# Patient Record
Sex: Female | Born: 1985 | Race: Black or African American | Hispanic: No | Marital: Single | Smoking: Former smoker
Health system: Southern US, Community
[De-identification: ages and names within clinical notes are randomized; demographics above are authoritative.]

## PROBLEM LIST (undated history)

## (undated) DIAGNOSIS — O210 Mild hyperemesis gravidarum: Secondary | ICD-10-CM

## (undated) DIAGNOSIS — R519 Headache, unspecified: Secondary | ICD-10-CM

## (undated) DIAGNOSIS — R109 Unspecified abdominal pain: Secondary | ICD-10-CM

## (undated) DIAGNOSIS — F329 Major depressive disorder, single episode, unspecified: Secondary | ICD-10-CM

## (undated) DIAGNOSIS — K219 Gastro-esophageal reflux disease without esophagitis: Secondary | ICD-10-CM

## (undated) DIAGNOSIS — F4310 Post-traumatic stress disorder, unspecified: Secondary | ICD-10-CM

## (undated) DIAGNOSIS — R51 Headache: Secondary | ICD-10-CM

## (undated) DIAGNOSIS — F419 Anxiety disorder, unspecified: Secondary | ICD-10-CM

## (undated) DIAGNOSIS — B009 Herpesviral infection, unspecified: Secondary | ICD-10-CM

## (undated) DIAGNOSIS — F32A Depression, unspecified: Secondary | ICD-10-CM

## (undated) DIAGNOSIS — B958 Unspecified staphylococcus as the cause of diseases classified elsewhere: Secondary | ICD-10-CM

## (undated) HISTORY — DX: Headache: R51

## (undated) HISTORY — DX: Unspecified staphylococcus as the cause of diseases classified elsewhere: B95.8

## (undated) HISTORY — DX: Unspecified abdominal pain: R10.9

## (undated) HISTORY — DX: Headache, unspecified: R51.9

## (undated) HISTORY — DX: Herpesviral infection, unspecified: B00.9

---
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
## 2010-07-27 HISTORY — PX: TUBAL LIGATION: SHX77

## 2011-06-02 ENCOUNTER — Encounter (HOSPITAL_COMMUNITY): Payer: Self-pay

## 2011-06-02 ENCOUNTER — Inpatient Hospital Stay (HOSPITAL_COMMUNITY): Payer: BC Managed Care – PPO

## 2011-06-02 ENCOUNTER — Inpatient Hospital Stay (HOSPITAL_COMMUNITY)
Admission: AD | Admit: 2011-06-02 | Discharge: 2011-06-02 | Disposition: A | Discharge status: Home / Self Care | Payer: BC Managed Care – PPO | Source: Ambulatory Visit | Attending: Obstetrics & Gynecology | Admitting: Obstetrics & Gynecology

## 2011-06-02 DIAGNOSIS — O2390 Unspecified genitourinary tract infection in pregnancy, unspecified trimester: Secondary | ICD-10-CM | POA: Insufficient documentation

## 2011-06-02 DIAGNOSIS — B373 Candidiasis of vulva and vagina: Secondary | ICD-10-CM

## 2011-06-02 DIAGNOSIS — O98819 Other maternal infectious and parasitic diseases complicating pregnancy, unspecified trimester: Secondary | ICD-10-CM | POA: Insufficient documentation

## 2011-06-02 DIAGNOSIS — A5901 Trichomonal vulvovaginitis: Secondary | ICD-10-CM | POA: Insufficient documentation

## 2011-06-02 DIAGNOSIS — B3731 Acute candidiasis of vulva and vagina: Secondary | ICD-10-CM | POA: Insufficient documentation

## 2011-06-02 DIAGNOSIS — O4690 Antepartum hemorrhage, unspecified, unspecified trimester: Secondary | ICD-10-CM

## 2011-06-02 DIAGNOSIS — O4700 False labor before 37 completed weeks of gestation, unspecified trimester: Secondary | ICD-10-CM | POA: Insufficient documentation

## 2011-06-02 HISTORY — DX: Mild hyperemesis gravidarum: O21.0

## 2011-06-02 LAB — URINALYSIS, ROUTINE W REFLEX MICROSCOPIC
Bilirubin Urine: NEGATIVE
Ketones, ur: NEGATIVE mg/dL
Nitrite: NEGATIVE
pH: 6 (ref 5.0–8.0)

## 2011-06-02 LAB — URINE MICROSCOPIC-ADD ON

## 2011-06-02 LAB — WET PREP, GENITAL: Clue Cells Wet Prep HPF POC: NONE SEEN

## 2011-06-02 MED ORDER — METRONIDAZOLE 500 MG PO TABS: 500.0000 mg | ORAL_TABLET | Freq: Two times a day (BID) | ORAL | Status: DC

## 2011-06-02 MED ORDER — FLUCONAZOLE 150 MG PO TABS: 150.0000 mg | ORAL_TABLET | Freq: Once | ORAL | Status: AC

## 2011-06-02 MED ORDER — METRONIDAZOLE 500 MG PO TABS: 500.0000 mg | ORAL_TABLET | Freq: Three times a day (TID) | ORAL | Status: AC

## 2011-06-02 NOTE — ED Provider Notes (Signed)
History    25 y/o G5P4004 comes in at [redacted]w[redacted]d with c/o contractions, a single episode of bleeding PV and yellow discharge PV  Chief Complaint  Patient presents with  . Contractions   HPI: Patient presents with c/o an episode of bleeding PV at about 9:30am this morning. She wiped it off with a couple of tissues and has not had any bleeding since then. She did not see any clots during this episode.She also c/o contractions since 11am this morning which are irregular, of moderate intensity and about 10/hour.  No h/o of trauma, intercourse in the past week or STIs during this pregnancies  Patient also c/o a yellowish, creamy, non-odorous dischrge PV since this morning. No associated itching or irritation.  No h/o leaking PV or ROM, baby is moving well. She has a h/o hyperemesis for which she is receiving Zofran and Protonix during this pregnancy.   Patient is receiving PNC in [place] and is here visiting. She was last seen on 05/22/11 and did not have any complications during this pregnancy apart from Hyperemesis.   No h/o fever, headaches, visual disturbances, RUQ pain, burning during micturition.  OB History    Grav Para Term Preterm Abortions TAB SAB Ect Mult Living   5 4 4  0 0 0 0 0 0 4    All previous pregnancies were term, NVD and without any complications. Last pregnancy was in 2011  Past Medical History  Diagnosis Date  . Hyperemesis gravidarum     Past Surgical History  Procedure Date  . Mouth surgery 2005    No family history on file.  History  Substance Use Topics  . Smoking status: Never Smoker   . Smokeless tobacco: Not on file  . Alcohol Use: No    Allergies:  Allergies  Allergen Reactions  . Hydrocodone Hives and Itching  . Latex Hives  . Tape Hives    Prescriptions prior to admission  Medication Sig Dispense Refill  . acetaminophen (TYLENOL) 500 MG tablet Take 500 mg by mouth daily as needed. pain       . ondansetron (ZOFRAN) 8 MG tablet Take 1 mg by mouth  daily as needed. nausea       . pantoprazole (PROTONIX) 40 MG tablet Take 40 mg by mouth daily.          Review of Systems  Constitutional: Negative for fever and chills.  HENT: Negative for ear pain, sore throat and ear discharge.   Eyes: Negative for blurred vision and double vision.  Respiratory: Negative for cough, shortness of breath and wheezing.   Cardiovascular: Negative for chest pain and palpitations.  Gastrointestinal: Positive for heartburn and nausea.  Genitourinary: Negative for dysuria, urgency and frequency.  Musculoskeletal: Negative for myalgias and joint pain.  Skin: Negative for itching.  Neurological: Negative for dizziness, tingling, tremors and headaches.  Endo/Heme/Allergies: Does not bruise/bleed easily.   Physical Exam   Blood pressure 110/63, pulse 92, temperature 98.2 F (36.8 C), temperature source Oral, resp. rate 20, height 5\' 4"  (1.626 m), weight 221 lb 12.8 oz (100.608 kg), last menstrual period 10/03/2010, SpO2 97.00%.  Physical Exam  Constitutional: She is oriented to person, place, and time. She appears well-developed and well-nourished. No distress.  HENT:  Head: Normocephalic and atraumatic.  Right Ear: External ear normal.  Left Ear: External ear normal.  Mouth/Throat: No oropharyngeal exudate.  Eyes: Conjunctivae and EOM are normal. Pupils are equal, round, and reactive to light. Right eye exhibits no discharge. Left eye  exhibits no discharge.  Neck: Normal range of motion. Neck supple.  Cardiovascular: Normal rate, regular rhythm, normal heart sounds and intact distal pulses.   No murmur heard. Respiratory: Effort normal and breath sounds normal. No respiratory distress. She has no wheezes.  GI: Soft. Bowel sounds are normal.  Genitourinary: There is no rash, tenderness, lesion or injury on the right labia. There is no rash, tenderness or injury on the left labia. No tenderness or bleeding around the vagina. No foreign body around the  vagina. No signs of injury around the vagina. Vaginal discharge found.       Yellowish white creamy discharge was seen in the vaginal walls on a speculum examination Cervix was closed on examination  Musculoskeletal: Normal range of motion.  Neurological: She is alert and oriented to person, place, and time. She has normal reflexes.  Skin: Skin is warm and dry.    MAU Course  Procedures  MDM FHR: 145 beats/ min, variability +, accelerations + UC: infrequent, irregular and more than 10 minutes apart on toco Pelvic examination: wet prep to confirm vaginal candidiasis US OB limited: r/o abruptio placenta   Assessment and Plan    Chetan Kapat 06/02/2011, 4:07 PM   Addendum: I reviewed history and examined patient with PAS. S: States her last [place] in [place] was all normal at 32 weeks. Denies antecedent intercourse or symptomatic vaginitis. Only prior bleeding episode was at 8 wks. No PN records available.  O: Abd: soft, NT, size c/w 34 weeks Spec: Vulva nl; Vagina with copious amt thick white adherent discharge; cx not friable and no evidence of cervicitis or lesions. No blood seen. VE: ext 1, int ft to closed/ long/ high  [place]: normal placenta ant, above cx, AFI 15 Results for orders placed during the hospital encounter of 06/02/11 (from the past 24 hour(s))  URINALYSIS, ROUTINE W REFLEX MICROSCOPIC     Status: Abnormal   Collection Time   06/02/11  3:50 PM      Component Value Range   Color, Urine YELLOW  YELLOW    Appearance CLEAR  CLEAR    Specific Gravity, Urine 1.020  1.005 - 1.030    pH 6.0  5.0 - 8.0    Glucose, UA NEGATIVE  NEGATIVE (mg/dL)   Hgb urine dipstick NEGATIVE  NEGATIVE    Bilirubin Urine NEGATIVE  NEGATIVE    Ketones, ur NEGATIVE  NEGATIVE (mg/dL)   Protein, ur NEGATIVE  NEGATIVE (mg/dL)   Urobilinogen, UA 0.2  0.0 - 1.0 (mg/dL)   Nitrite NEGATIVE  NEGATIVE    Leukocytes, UA LARGE (*) NEGATIVE   URINE MICROSCOPIC-ADD ON     Status: Abnormal   Collection Time    06/02/11  3:50 PM      Component Value Range   Squamous Epithelial / LPF FEW (*) RARE    WBC, UA 11-20  <3 (WBC/hpf)   Bacteria, UA RARE  RARE    Urine-Other MUCOUS PRESENT    WET PREP, GENITAL     Status: Abnormal   Collection Time   06/02/11  3:58 PM      Component Value Range   Yeast, Wet Prep MODERATE (*) NONE SEEN    Trich, Wet Prep FEW (*) NONE SEEN    Clue Cells, Wet Prep NONE SEEN  NONE SEEN    WBC, Wet Prep HPF POC MANY (*) NONE SEEN    ASSESSMENT: G5P4004 at 34.4 wks with reported light  red spotting p.v.; no evidence of bleeding found  Candidiasis and trich vaginiti  Cat 1 FHR tracing  PLAN: Home with bleeding precautions and OK to travel back to [place] in 2 days as planned if no further bleeding. Treat yeast and trich with Diflucan and Flagyl. F/U in [place] as scheduled

## 2011-06-02 NOTE — Progress Notes (Signed)
Pt states blood show this morning followed by contractions. States she has more than 10 contractions an hour. Denies leaking of fluid and has not had vaginal bleeding since this morning. Reports positive fetal movement. States she has been getting prenatal care in [place] but is currently visiting here.

## 2011-06-04 NOTE — ED Provider Notes (Signed)
Attestation of Attending Supervision of Advanced Practitioner: Evaluation and management procedures were performed by the PA/NP/CNM/OB Fellow under my supervision/collaboration. Chart reviewed, and agree with management and plan.  Jamahl Lemmons A M.D. 06/04/2011 10:29 AM   

## 2012-05-12 ENCOUNTER — Emergency Department (HOSPITAL_COMMUNITY)
Admission: EM | Admit: 2012-05-12 | Discharge: 2012-05-12 | Disposition: A | Discharge status: Home / Self Care | Payer: Medicaid Other | Attending: Emergency Medicine | Admitting: Emergency Medicine

## 2012-05-12 ENCOUNTER — Encounter (HOSPITAL_COMMUNITY): Payer: Self-pay

## 2012-05-12 DIAGNOSIS — R059 Cough, unspecified: Secondary | ICD-10-CM | POA: Insufficient documentation

## 2012-05-12 DIAGNOSIS — R590 Localized enlarged lymph nodes: Secondary | ICD-10-CM

## 2012-05-12 DIAGNOSIS — R070 Pain in throat: Secondary | ICD-10-CM | POA: Insufficient documentation

## 2012-05-12 DIAGNOSIS — J069 Acute upper respiratory infection, unspecified: Secondary | ICD-10-CM

## 2012-05-12 DIAGNOSIS — R05 Cough: Secondary | ICD-10-CM | POA: Insufficient documentation

## 2012-05-12 DIAGNOSIS — J029 Acute pharyngitis, unspecified: Secondary | ICD-10-CM

## 2012-05-12 NOTE — ED Notes (Signed)
Throat swabbed and sent to lab per protocol

## 2012-05-12 NOTE — ED Notes (Signed)
Pt has had sore throat and cough x 2 days

## 2012-05-13 LAB — STREP A DNA PROBE

## 2012-05-15 ENCOUNTER — Encounter (HOSPITAL_COMMUNITY): Payer: Self-pay | Admitting: Emergency Medicine

## 2012-05-15 ENCOUNTER — Emergency Department (HOSPITAL_COMMUNITY)
Admission: EM | Admit: 2012-05-15 | Discharge: 2012-05-15 | Disposition: A | Discharge status: Home / Self Care | Payer: Medicaid Other | Attending: Emergency Medicine | Admitting: Emergency Medicine

## 2012-05-15 DIAGNOSIS — R112 Nausea with vomiting, unspecified: Secondary | ICD-10-CM

## 2012-05-15 DIAGNOSIS — J029 Acute pharyngitis, unspecified: Secondary | ICD-10-CM | POA: Insufficient documentation

## 2012-05-15 LAB — CBC WITH DIFFERENTIAL/PLATELET
Eosinophils Relative: 0 % (ref 0–5)
HCT: 36.5 % (ref 36.0–46.0)
Hemoglobin: 12.1 g/dL (ref 12.0–15.0)
Lymphocytes Relative: 17 % (ref 12–46)
Lymphs Abs: 1.6 10*3/uL (ref 0.7–4.0)
MCV: 91.3 fL (ref 78.0–100.0)
Monocytes Absolute: 0.6 10*3/uL (ref 0.1–1.0)
Monocytes Relative: 6 % (ref 3–12)
RBC: 4 MIL/uL (ref 3.87–5.11)
RDW: 13.3 % (ref 11.5–15.5)
WBC: 9.4 10*3/uL (ref 4.0–10.5)

## 2012-05-15 LAB — COMPREHENSIVE METABOLIC PANEL
BUN: 6 mg/dL (ref 6–23)
CO2: 30 mEq/L (ref 19–32)
Calcium: 9.2 mg/dL (ref 8.4–10.5)
Creatinine, Ser: 0.62 mg/dL (ref 0.50–1.10)
GFR calc Af Amer: >90 mL/min (ref >90)
GFR calc non Af Amer: >90 mL/min (ref >90)
Glucose, Bld: 150 mg/dL — ABNORMAL HIGH (ref 70–99)
Total Bilirubin: 0.2 mg/dL — ABNORMAL LOW (ref 0.3–1.2)

## 2012-05-15 MED ORDER — DEXTROSE 5 % IV SOLN
1.0000 g | INTRAVENOUS | Status: DC
  Administered 2012-05-15: 1 g via INTRAVENOUS
  Filled 2012-05-15: qty 10

## 2012-05-15 MED ORDER — MAGIC MOUTHWASH: 10.0000 mL | ORAL | Status: DC | PRN

## 2012-05-15 MED ORDER — MORPHINE SULFATE 4 MG/ML IJ SOLN
4.0000 mg | Freq: Once | INTRAMUSCULAR | Status: AC
  Administered 2012-05-15: 4 mg via INTRAVENOUS
  Filled 2012-05-15: qty 1

## 2012-05-15 MED ORDER — SODIUM CHLORIDE 0.9 % IV BOLUS (SEPSIS)
1000.0000 mL | Freq: Once | INTRAVENOUS | Status: AC
  Administered 2012-05-15: 1000 mL via INTRAVENOUS

## 2012-05-15 MED ORDER — AMOXICILLIN 500 MG PO CAPS: 1000.0000 mg | ORAL_CAPSULE | Freq: Two times a day (BID) | ORAL | Status: DC

## 2012-05-15 MED ORDER — DEXAMETHASONE SODIUM PHOSPHATE 10 MG/ML IJ SOLN
10.0000 mg | Freq: Once | INTRAMUSCULAR | Status: AC
  Administered 2012-05-15: 10 mg via INTRAVENOUS
  Filled 2012-05-15: qty 1

## 2012-05-15 MED ORDER — TRAMADOL HCL 50 MG PO TABS: 50.0000 mg | ORAL_TABLET | Freq: Four times a day (QID) | ORAL | Status: DC | PRN

## 2012-05-15 MED ORDER — ONDANSETRON HCL 4 MG/2ML IJ SOLN
4.0000 mg | Freq: Once | INTRAMUSCULAR | Status: AC
  Administered 2012-05-15: 4 mg via INTRAVENOUS
  Filled 2012-05-15: qty 2

## 2012-05-15 MED ORDER — METOCLOPRAMIDE HCL 10 MG PO TABS: 10.0000 mg | ORAL_TABLET | Freq: Four times a day (QID) | ORAL | Status: DC | PRN

## 2012-05-15 MED ORDER — SODIUM CHLORIDE 0.9 % IV BOLUS (SEPSIS): 1000.0000 mL | Freq: Once | INTRAVENOUS | Status: DC

## 2012-05-15 NOTE — ED Notes (Signed)
Pt reports continues to have sore throat x 1 week. Pt seen here Tuesday for same. Pt c/o pain with swallowing and having fever (102.4) all night.

## 2012-05-15 NOTE — ED Provider Notes (Signed)
History     CSN: [id number]  Arrival date & time 05/15/12  4098   First MD Initiated Contact with Patient 05/15/12 1100      Chief Complaint  Patient presents with  . Sore Throat    (Consider location/radiation/quality/duration/timing/severity/associated sxs/prior treatment) Patient is a 26 y.o. female presenting with pharyngitis. The history is provided by the patient.  Sore Throat  She has had a sore throat for about one week. She was seen in emergency Department 5 days ago and was told that it was not strep and to treat symptomatically. During that time, she has not seen any improvement and in fact has gotten worse. She's running fevers as high as 102 she's had chills and sweats. She's also had anorexia and nausea and vomiting. She denies rhinorrhea denies abdominal pain or diarrhea. She denies arthralgias or myalgias. Pain is severe and she rates at 9/10.  Past Medical History  Diagnosis Date  . Hyperemesis gravidarum     Past Surgical History  Procedure Date  . Mouth surgery 2005  . Tubal ligation     No family history on file.  History  Substance Use Topics  . Smoking status: Never Smoker   . Smokeless tobacco: Not on file  . Alcohol Use: No    OB History    Grav Para Term Preterm Abortions TAB SAB Ect Mult Living   5 4 4  0 0 0 0 0 0 4      Review of Systems  All other systems reviewed and are negative.    Allergies  Hydrocodone; Latex; and Tape  Home Medications  No current outpatient prescriptions on file.  BP 103/72  Pulse 83  Temp 99.1 F (37.3 C) (Oral)  Resp 20  SpO2 94%  LMP 05/05/2012  Breastfeeding? Unknown  Physical Exam  Nursing note and vitals reviewed. 26 year old female, resting comfortably and in no acute distress. Vital signs are normal. Oxygen saturation is 94%, which is normal. Head is normocephalic and atraumatic. PERRLA, EOMI. Oropharynx shows tonsillar exudate and moderate erythema. She has no difficulty with secretions  and phonation is normal. There is no stridor.. Neck has multiple tender submandibular, anterior cervical, and posterior cervical lymph nodes.. Back is nontender and there is no CVA tenderness. Lungs are clear without rales, wheezes, or rhonchi. Chest is nontender. Heart has regular rate and rhythm. There is a 2/6 systolic ejection murmur heard along the sternal border. Abdomen has no masses or hepatosplenomegaly and peristalsis is normoactive. Extremities have no cyanosis or edema, full range of motion is present. Skin is warm and dry without rash. Neurologic: Mental status is normal, cranial nerves are intact, there are no motor or sensory deficits.   ED Course  Procedures (including critical care time)  Results for orders placed during the hospital encounter of 05/15/12  CBC WITH DIFFERENTIAL      Component Value Range   WBC 9.4  4.0 - 10.5 K/uL   RBC 4.00  3.87 - 5.11 MIL/uL   Hemoglobin 12.1  12.0 - 15.0 g/dL   HCT 11.9  14.7 - 82.9 %   MCV 91.3  78.0 - 100.0 fL   MCH 30.3  26.0 - 34.0 pg   MCHC 33.2  30.0 - 36.0 g/dL   RDW 56.2  13.0 - 86.5 %   Platelets 284  150 - 400 K/uL   Neutrophils Relative 77  43 - 77 %   Neutro Abs 7.2  1.7 - 7.7 K/uL   Lymphocytes  Relative 17  12 - 46 %   Lymphs Abs 1.6  0.7 - 4.0 K/uL   Monocytes Relative 6  3 - 12 %   Monocytes Absolute 0.6  0.1 - 1.0 K/uL   Eosinophils Relative 0  0 - 5 %   Eosinophils Absolute 0.0  0.0 - 0.7 K/uL   Basophils Relative 0  0 - 1 %   Basophils Absolute 0.0  0.0 - 0.1 K/uL  COMPREHENSIVE METABOLIC PANEL      Component Value Range   Sodium 140  135 - 145 mEq/L   Potassium 3.8  3.5 - 5.1 mEq/L   Chloride 102  96 - 112 mEq/L   CO2 30  19 - 32 mEq/L   Glucose, Bld 150 (*) 70 - 99 mg/dL   BUN 6  6 - 23 mg/dL   Creatinine, Ser 0.45  0.50 - 1.10 mg/dL   Calcium 9.2  8.4 - 40.9 mg/dL   Total Protein 7.6  6.0 - 8.3 g/dL   Albumin 3.8  3.5 - 5.2 g/dL   AST 18  0 - 37 U/L   ALT 12  0 - 35 U/L   Alkaline  Phosphatase 86  39 - 117 U/L   Total Bilirubin 0.2 (*) 0.3 - 1.2 mg/dL   GFR calc non Af Amer >90  >90 mL/min   GFR calc Af Amer >90  >90 mL/min  MONONUCLEOSIS SCREEN      Component Value Range   Mono Screen NEGATIVE  NEGATIVE      1. Pharyngitis   2. Nausea and vomiting       MDM  Pharyngitis. Prior ED visit record was reviewed and she had a negative strep screen and a negative strep DNA probe. Persistent sore throat with exudate is concerning for possible mononucleosis. Modestly will be rendered should be given IV fluids and IV dexamethasone. If the mono screen is negative, she will be treated for possible bacterial pharyngitis - non-strep.  Mono screen is come back negative. She's given a dose of Rocephin and sent home with prescriptions for tramadol, metoclopramide, and amoxicillin.      Dione Booze, MD 05/15/12 [phone]

## 2012-05-23 NOTE — ED Provider Notes (Signed)
History     CSN: [id number]  Arrival date & time 05/12/12  0804   First MD Initiated Contact with Patient 05/12/12 0809      Chief Complaint  Patient presents with  . Sore Throat    (Consider location/radiation/quality/duration/timing/severity/associated sxs/prior treatment) HPI Comments: Angela Mosley 26 y.o. female   The chief complaint is: Patient presents with:   Sore Throat    Presents with cc of sore throat and productive cough for two days.  +congestion. Denies otalgia, trismus, difficulty swallowing or breathing. Denies sinus pain. Denies excessive fatigue, somnolence or sick contacts.  Denies painful cervical adenopathy or history of strep throat. Denies fevers, chills, myalgias, arthralgias, nausea, vomiting, diarrhea.      Patient is a 26 y.o. female presenting with pharyngitis. The history is provided by the patient. No language interpreter was used.  Sore Throat Associated symptoms include congestion, coughing and a sore throat. Pertinent negatives include no abdominal pain, arthralgias, chest pain, chills, fever, myalgias, nausea, neck pain, numbness, rash or vomiting.    Past Medical History  Diagnosis Date  . Hyperemesis gravidarum     Past Surgical History  Procedure Date  . Mouth surgery 2005  . Tubal ligation     No family history on file.  History  Substance Use Topics  . Smoking status: Never Smoker   . Smokeless tobacco: Not on file  . Alcohol Use: No    OB History    Grav Para Term Preterm Abortions TAB SAB Ect Mult Living   5 4 4  0 0 0 0 0 0 4      Review of Systems  Constitutional: Negative.  Negative for fever and chills.  HENT: Positive for congestion, sore throat and sinus pressure. Negative for ear pain, facial swelling, rhinorrhea, trouble swallowing, neck pain, neck stiffness, dental problem, voice change, postnasal drip and ear discharge.   Eyes: Negative.   Respiratory: Positive for cough. Negative for shortness  of breath.   Cardiovascular: Negative.  Negative for chest pain.  Gastrointestinal: Negative.  Negative for nausea, vomiting, abdominal pain, diarrhea and constipation.  Genitourinary: Negative.  Negative for dysuria and hematuria.  Musculoskeletal: Negative.  Negative for myalgias, arthralgias and gait problem.  Skin: Negative.  Negative for rash.  Neurological: Negative for numbness.  All other systems reviewed and are negative.    Allergies  Hydrocodone; Latex; and Tape  Home Medications   Current Outpatient Rx  Name Route Sig Dispense Refill  . MAGIC MOUTHWASH Oral Take 10 mLs by mouth every 2 (two) hours as needed. 150 mL 0  . AMOXICILLIN 500 MG PO CAPS Oral Take 2 capsules (1,000 mg total) by mouth 2 (two) times daily. 40 capsule 0  . METOCLOPRAMIDE HCL 10 MG PO TABS Oral Take 1 tablet (10 mg total) by mouth every 6 (six) hours as needed (nausea). 30 tablet 0  . TRAMADOL HCL 50 MG PO TABS Oral Take 1 tablet (50 mg total) by mouth every 6 (six) hours as needed for pain. 15 tablet 0    BP 129/95  Pulse 88  Temp 98.6 F (37 C) (Oral)  Resp 24  SpO2 98%  LMP 05/05/2012  Breastfeeding? Unknown  Physical Exam  Constitutional: She is oriented to person, place, and time. She appears well-developed and well-nourished. No distress.  HENT:  Head: Normocephalic and atraumatic. No trismus in the jaw.  Right Ear: Tympanic membrane normal. No mastoid tenderness. Tympanic membrane is not injected, not erythematous, not retracted and not bulging.  Left Ear: Tympanic membrane and external ear normal. No mastoid tenderness. Tympanic membrane is not injected, not erythematous, not retracted and not bulging.  Nose: No mucosal edema.  Mouth/Throat: Uvula is midline. No dental abscesses or uvula swelling. Posterior oropharyngeal erythema present. No oropharyngeal exudate, posterior oropharyngeal edema or tonsillar abscesses.  Eyes: Conjunctivae normal are normal. No scleral icterus.  Neck:  Normal range of motion.  Cardiovascular: Normal rate, regular rhythm and normal heart sounds.  Exam reveals no gallop and no friction rub.   No murmur heard. Pulmonary/Chest: Effort normal and breath sounds normal. No respiratory distress.  Abdominal: Soft. Bowel sounds are normal. She exhibits no distension and no mass. There is no tenderness. There is no guarding.  Neurological: She is alert and oriented to person, place, and time.  Skin: Skin is warm and dry. She is not diaphoretic.    ED Course  Procedures (including critical care time)  Results for orders placed during the hospital encounter of 05/12/12  RAPID STREP SCREEN      Component Value Range   Streptococcus, Group A Screen (Direct) NEGATIVE  NEGATIVE  STREP A DNA PROBE      Component Value Range   Specimen Description THROAT     Special Requests ADDED AT 1000 ON 101713     Group A Strep Probe NEGATIVE     Report Status 05/13/2012 FINAL       No results found.   1. URI (upper respiratory infection)   2. Pharyngitis   3. Cervical adenopathy       MDM  Negative strep- 2 days of symptoms.  Patient appears well and is friendly and interactive.  Viral URI.  Symptomatic treatment.  Paitent may return to ed if symptoms do not resolve or worsen significatnly . Discussed return precautions. Discussed reasons to seek immediate care. Patient expresses understanding and agrees with plan.         Arthor Captain, PA-C 05/23/12 2127

## 2012-05-24 NOTE — ED Provider Notes (Signed)
Medical screening examination/treatment/procedure(s) were performed by non-physician practitioner and as supervising physician I was immediately available for consultation/collaboration.  Ethelda Chick, MD 05/24/12 1159

## 2012-06-18 ENCOUNTER — Encounter (HOSPITAL_COMMUNITY): Payer: Self-pay | Admitting: Emergency Medicine

## 2012-06-18 ENCOUNTER — Emergency Department (HOSPITAL_COMMUNITY)
Admission: EM | Admit: 2012-06-18 | Discharge: 2012-06-18 | Disposition: A | Discharge status: Home / Self Care | Payer: Medicaid Other | Attending: Emergency Medicine | Admitting: Emergency Medicine

## 2012-06-18 DIAGNOSIS — N898 Other specified noninflammatory disorders of vagina: Secondary | ICD-10-CM | POA: Insufficient documentation

## 2012-06-18 DIAGNOSIS — R102 Pelvic and perineal pain: Secondary | ICD-10-CM

## 2012-06-18 DIAGNOSIS — O210 Mild hyperemesis gravidarum: Secondary | ICD-10-CM | POA: Insufficient documentation

## 2012-06-18 DIAGNOSIS — N949 Unspecified condition associated with female genital organs and menstrual cycle: Secondary | ICD-10-CM | POA: Insufficient documentation

## 2012-06-18 LAB — WET PREP, GENITAL
Trich, Wet Prep: NONE SEEN
Yeast Wet Prep HPF POC: NONE SEEN

## 2012-06-18 MED ORDER — NAPROXEN 500 MG PO TABS: 500.0000 mg | ORAL_TABLET | Freq: Two times a day (BID) | ORAL | Status: DC

## 2012-06-18 NOTE — ED Provider Notes (Signed)
History     CSN: [id number]  Arrival date & time 06/18/12  1329   First MD Initiated Contact with Patient 06/18/12 1541      Chief Complaint  Patient presents with  . Vaginal Pain    (Consider location/radiation/quality/duration/timing/severity/associated sxs/prior treatment) Patient is a 26 y.o. female presenting with vaginal pain. The history is provided by the patient and the spouse.  Vaginal Pain Pertinent negatives include no abdominal pain, no headaches and no shortness of breath.   patient with complaint of vaginal bleeding feels that she tore her episiotomy scar with sexual intercourse yesterday. Patient with persistent pain bleeding has stopped. No abdominal pain no nausea no vomiting. No history of similar problem.  Patient states that the pain is 3-4/10. Soreness nonradiating.  Past Medical History  Diagnosis Date  . Hyperemesis gravidarum     Past Surgical History  Procedure Date  . Mouth surgery 2005  . Tubal ligation     No family history on file.  History  Substance Use Topics  . Smoking status: Never Smoker   . Smokeless tobacco: Not on file  . Alcohol Use: No    OB History    Grav Para Term Preterm Abortions TAB SAB Ect Mult Living   5 4 4  0 0 0 0 0 0 4      Review of Systems  Constitutional: Negative for fever.  HENT: Negative for neck pain.   Eyes: Negative for redness.  Respiratory: Negative for shortness of breath.   Gastrointestinal: Negative for nausea, vomiting and abdominal pain.  Genitourinary: Positive for vaginal bleeding and vaginal pain. Negative for vaginal discharge.  Musculoskeletal: Negative for back pain.  Neurological: Negative for headaches.  Hematological: Does not bruise/bleed easily.    Allergies  Hydrocodone; Latex; and Tape  Home Medications   Current Outpatient Rx  Name  Route  Sig  Dispense  Refill  . NAPROXEN 500 MG PO TABS   Oral   Take 1 tablet (500 mg total) by mouth 2 (two) times daily.   14  tablet   0     BP 132/105  Pulse 105  Temp 98.6 F (37 C) (Oral)  Resp 19  SpO2 100%  LMP 06/04/2012  Physical Exam  Nursing note and vitals reviewed. Constitutional: She is oriented to person, place, and time. She appears well-developed and well-nourished.  HENT:  Head: Normocephalic.  Mouth/Throat: Oropharynx is clear and moist.  Eyes: Conjunctivae normal and EOM are normal. Pupils are equal, round, and reactive to light.  Neck: Normal range of motion. Neck supple.  Cardiovascular: Normal rate, regular rhythm and normal heart sounds.   No murmur heard. Pulmonary/Chest: Effort normal and breath sounds normal.  Abdominal: Soft. Bowel sounds are normal. There is no tenderness.  Genitourinary: Vagina normal and uterus normal.       No blood in the vaginal vault. No cervical motion tenderness no adnexal tenderness. Tender at the posterior aspect of the introitus to the vaginal area but no evidence of tear no evidence of rectal fissure or tear. No evidence of blood at all. No significant discharge.  Musculoskeletal: Normal range of motion.  Neurological: She is alert and oriented to person, place, and time. No cranial nerve deficit. She exhibits normal muscle tone. Coordination normal.    ED Course  Procedures (including critical care time)   Labs Reviewed  GC/CHLAMYDIA PROBE AMP  WET PREP, GENITAL   No results found.   1. Vaginal pain  MDM  Patient was complaining of vaginal pain at the junction between the rectum and the vaginal area had some bleeding there yesterday after intercourse. It felt that there was a tear where her episiotomy was before. Value aeration here in the ED shows no evidence of trauma no tears rectally or vaginally. Routine cultures were done with the pelvic. The patient had no significant discharge no cervical motion tenderness no uterine tenderness no adnexal tenderness. No evidence of any blood.        Shelda Jakes, MD 06/18/12  1754

## 2012-06-18 NOTE — ED Notes (Signed)
  Pt. Stated, I was having sex last night and where I had an episiotomy i it split.

## 2012-12-24 ENCOUNTER — Encounter (HOSPITAL_COMMUNITY): Payer: Self-pay | Admitting: *Deleted

## 2012-12-24 ENCOUNTER — Emergency Department (HOSPITAL_COMMUNITY): Payer: Self-pay

## 2012-12-24 ENCOUNTER — Emergency Department (HOSPITAL_COMMUNITY)
Admission: EM | Admit: 2012-12-24 | Discharge: 2012-12-25 | Disposition: A | Discharge status: Home / Self Care | Payer: Self-pay | Attending: Emergency Medicine | Admitting: Emergency Medicine

## 2012-12-24 DIAGNOSIS — IMO0002 Reserved for concepts with insufficient information to code with codable children: Secondary | ICD-10-CM | POA: Insufficient documentation

## 2012-12-24 DIAGNOSIS — Y9302 Activity, running: Secondary | ICD-10-CM | POA: Insufficient documentation

## 2012-12-24 DIAGNOSIS — Z91040 Latex allergy status: Secondary | ICD-10-CM | POA: Insufficient documentation

## 2012-12-24 DIAGNOSIS — S91109A Unspecified open wound of unspecified toe(s) without damage to nail, initial encounter: Secondary | ICD-10-CM | POA: Insufficient documentation

## 2012-12-24 DIAGNOSIS — S8990XA Unspecified injury of unspecified lower leg, initial encounter: Secondary | ICD-10-CM | POA: Insufficient documentation

## 2012-12-24 DIAGNOSIS — Y929 Unspecified place or not applicable: Secondary | ICD-10-CM | POA: Insufficient documentation

## 2012-12-24 DIAGNOSIS — S99922A Unspecified injury of left foot, initial encounter: Secondary | ICD-10-CM

## 2012-12-24 DIAGNOSIS — S99919A Unspecified injury of unspecified ankle, initial encounter: Secondary | ICD-10-CM | POA: Insufficient documentation

## 2012-12-24 MED ORDER — NAPROXEN 500 MG PO TABS: 500.0000 mg | ORAL_TABLET | Freq: Two times a day (BID) | ORAL | Status: DC

## 2012-12-24 MED ORDER — OXYCODONE-ACETAMINOPHEN 5-325 MG PO TABS: 1.0000 | ORAL_TABLET | ORAL | Status: DC | PRN

## 2012-12-24 MED ORDER — OXYCODONE-ACETAMINOPHEN 5-325 MG PO TABS
2.0000 | ORAL_TABLET | Freq: Once | ORAL | Status: AC
  Administered 2012-12-24: 2 via ORAL
  Filled 2012-12-24: qty 2

## 2012-12-24 NOTE — ED Notes (Signed)
The pt is c/o lt little toe pain since last pm when she struck it on some exercise equipment.  Pain since then

## 2012-12-24 NOTE — ED Provider Notes (Signed)
History    This chart was scribed for a non-physician practitioner, Dierdre Forth, PA-C, working with Juliet Rude. Rubin Payor, MD by Frederik Pear, ED Scribe. This patient was seen in room TR10C/TR10C and the patient's care was started at 2313.   CSN: [id number]  Arrival date & time 12/24/12  1953   First MD Initiated Contact with Patient 12/24/12 2313      Chief Complaint  Patient presents with  . Toe Injury    (Consider location/radiation/quality/duration/timing/severity/associated sxs/prior treatment) Patient is a 27 y.o. female presenting with toe pain. The history is provided by the patient and medical records. No language interpreter was used.  Toe Pain This is a new problem. The current episode started yesterday. The problem occurs constantly. The problem has not changed since onset.Pertinent negatives include no chest pain, no abdominal pain, no headaches and no shortness of breath. Exacerbated by: bending the toe. Nothing relieves the symptoms. Treatments tried: elevating and icing the area. The treatment provided mild relief.   HPI Comments: Angela Mosley is a 28 y.o. female who presents to the Emergency Department complaining of sudden onset, throbbing, severe left fifth digit toe pain with swelling that began last night when she injured the area when she ran into exercise equipment. She reports that after the accident that the toenail split and the toe began to bleed around the nailbed. In ED, the bleeding is controlled and the area is hemostatic. She states that she treated the area at home with ice, elevating the area as well as attempting to clean the foot. She is allergic to latex, tape, and hydrocodone.    Past Medical History  Diagnosis Date  . Hyperemesis gravidarum     Past Surgical History  Procedure Laterality Date  . Mouth surgery  2005  . Tubal ligation      No family history on file.  History  Substance Use Topics  . Smoking status: Never Smoker    . Smokeless tobacco: Not on file  . Alcohol Use: No    OB History   Grav Para Term Preterm Abortions TAB SAB Ect Mult Living   5 4 4  0 0 0 0 0 0 4      Review of Systems  Constitutional: Negative for fever, diaphoresis, appetite change, fatigue and unexpected weight change.  HENT: Negative for mouth sores and neck stiffness.   Eyes: Negative for visual disturbance.  Respiratory: Negative for cough, chest tightness, shortness of breath and wheezing.   Cardiovascular: Negative for chest pain.  Gastrointestinal: Negative for nausea, vomiting, abdominal pain, diarrhea and constipation.  Endocrine: Negative for polydipsia, polyphagia and polyuria.  Genitourinary: Negative for dysuria, urgency, frequency and hematuria.  Musculoskeletal: Negative for back pain.  Skin: Positive for wound (Left fifth digit toe injury). Negative for rash.  Allergic/Immunologic: Negative for immunocompromised state.  Neurological: Negative for syncope, light-headedness and headaches.  Hematological: Does not bruise/bleed easily.  Psychiatric/Behavioral: Negative for sleep disturbance. The patient is not nervous/anxious.     Allergies  Hydrocodone; Latex; and Tape  Home Medications   Current Outpatient Rx  Name  Route  Sig  Dispense  Refill  . naproxen (NAPROSYN) 500 MG tablet   Oral   Take 1 tablet (500 mg total) by mouth 2 (two) times daily with a meal.   30 tablet   0   . oxyCODONE-acetaminophen (PERCOCET/ROXICET) 5-325 MG per tablet   Oral   Take 1 tablet by mouth every 4 (four) hours as needed for pain.   9  tablet   0     BP 110/71  Pulse 64  Temp(Src) 98.4 F (36.9 C)  Resp 18  SpO2 98%  LMP 12/07/2012  Physical Exam  Nursing note and vitals reviewed. Constitutional: She appears well-developed and well-nourished. No distress.  HENT:  Head: Normocephalic and atraumatic.  Mouth/Throat: Oropharynx is clear and moist. No oropharyngeal exudate.  Eyes: Conjunctivae are normal. No  scleral icterus.  Neck: Normal range of motion. Neck supple.  Cardiovascular: Normal rate, regular rhythm and intact distal pulses.   CR is less than throat.  Pulmonary/Chest: Effort normal and breath sounds normal. No respiratory distress. She has no wheezes.  Abdominal: Soft. Bowel sounds are normal. She exhibits no mass. There is no tenderness. There is no rebound and no guarding.  Musculoskeletal: Normal range of motion. She exhibits tenderness. She exhibits no edema.  Full ROM of the ankle. Full ROM of the other toes. Decreased ROM in the fifth left digit secondary to pain. Nail is split in half with blood under the pinky.  Neurological: She is alert.  Speech is clear and goal oriented Moves extremities without ataxia Sensation is intact.  Skin: Skin is warm and dry. She is not diaphoretic.  No erythema, induration or fluctuance, no gross abscess Nailbed intact, hemostasis achieved  Psychiatric: She has a normal mood and affect.    ED Course  Procedures (including critical care time)  DIAGNOSTIC STUDIES: Oxygen Saturation is 98% on room air, normal by my interpretation.    COORDINATION OF CARE:   23:46- Discussed planned course of treatment with the patient, including Percocet, Naprosyn, and a post op boot, who is agreeable at this time.  00:00- Medication Orders- oxycodone-acetaminophen (percocet/roxicet) 5-325 mg per tablet 2 tablet- once.   Labs Reviewed - No data to display Dg Foot Complete Left  12/24/2012   *RADIOLOGY REPORT*  Clinical Data: Foot injury and pain.  Swelling in region of fifth toe.  LEFT FOOT - COMPLETE 3+ VIEW  Comparison:  None.  Findings:  There is no evidence of fracture or dislocation.  There is no evidence of arthropathy or other focal bone abnormality. Soft tissues are unremarkable.  IMPRESSION: Negative.   Original Report Authenticated By: Myles Rosenthal, M.D.     1. Injury of toe on left foot, initial encounter       MDM  Angela Mosley  presents with toe pain.  Patient X-Ray negative for obvious fracture or dislocation. I personally reviewed the imaging tests through PACS system.  I reviewed available ER/hospitalization records through the EMR.  Pain managed in ED. Pt advised to follow up with orthopedics if symptoms persist for possibility of missed fracture diagnosis. Discussed care od nail injury and specific return precautions for infection.  Patient given post op shoe while in ED, conservative therapy recommended and discussed. Patient will be dc home & is agreeable with above plan.  I personally performed the services described in this documentation, which was scribed in my presence. The recorded information has been reviewed and is accurate.      Dahlia Client Maryana Pittmon, PA-C 12/25/12 0009

## 2012-12-25 NOTE — ED Provider Notes (Signed)
Medical screening examination/treatment/procedure(s) were performed by non-physician practitioner and as supervising physician I was immediately available for consultation/collaboration.  Lolah Coghlan R. Nare Gaspari, MD 12/25/12 0802 

## 2013-02-24 ENCOUNTER — Inpatient Hospital Stay (HOSPITAL_COMMUNITY)
Admission: AD | Admit: 2013-02-24 | Discharge: 2013-03-01 | DRG: 885 | Disposition: A | Discharge status: Home / Self Care | Payer: No Typology Code available for payment source | Source: Intra-hospital | Attending: Psychiatry | Admitting: Psychiatry

## 2013-02-24 ENCOUNTER — Encounter (HOSPITAL_COMMUNITY): Payer: Self-pay | Admitting: Emergency Medicine

## 2013-02-24 ENCOUNTER — Telehealth (HOSPITAL_COMMUNITY): Payer: Self-pay | Admitting: Emergency Medicine

## 2013-02-24 ENCOUNTER — Emergency Department (HOSPITAL_COMMUNITY)
Admission: EM | Admit: 2013-02-24 | Discharge: 2013-02-24 | Disposition: A | Discharge status: Other Acute Inpatient Hospital | Payer: Self-pay | Attending: Emergency Medicine | Admitting: Emergency Medicine

## 2013-02-24 DIAGNOSIS — F411 Generalized anxiety disorder: Secondary | ICD-10-CM | POA: Diagnosis present

## 2013-02-24 DIAGNOSIS — T394X2A Poisoning by antirheumatics, not elsewhere classified, intentional self-harm, initial encounter: Secondary | ICD-10-CM | POA: Insufficient documentation

## 2013-02-24 DIAGNOSIS — Z3202 Encounter for pregnancy test, result negative: Secondary | ICD-10-CM | POA: Insufficient documentation

## 2013-02-24 DIAGNOSIS — Z91040 Latex allergy status: Secondary | ICD-10-CM | POA: Insufficient documentation

## 2013-02-24 DIAGNOSIS — R1110 Vomiting, unspecified: Secondary | ICD-10-CM | POA: Insufficient documentation

## 2013-02-24 DIAGNOSIS — F332 Major depressive disorder, recurrent severe without psychotic features: Principal | ICD-10-CM | POA: Diagnosis present

## 2013-02-24 DIAGNOSIS — T391X1A Poisoning by 4-Aminophenol derivatives, accidental (unintentional), initial encounter: Secondary | ICD-10-CM | POA: Insufficient documentation

## 2013-02-24 DIAGNOSIS — F1210 Cannabis abuse, uncomplicated: Secondary | ICD-10-CM | POA: Diagnosis present

## 2013-02-24 DIAGNOSIS — R450 Nervousness: Secondary | ICD-10-CM | POA: Insufficient documentation

## 2013-02-24 DIAGNOSIS — F4310 Post-traumatic stress disorder, unspecified: Secondary | ICD-10-CM | POA: Diagnosis present

## 2013-02-24 DIAGNOSIS — Z8659 Personal history of other mental and behavioral disorders: Secondary | ICD-10-CM | POA: Insufficient documentation

## 2013-02-24 DIAGNOSIS — R4589 Other symptoms and signs involving emotional state: Secondary | ICD-10-CM

## 2013-02-24 DIAGNOSIS — R45851 Suicidal ideations: Secondary | ICD-10-CM | POA: Insufficient documentation

## 2013-02-24 DIAGNOSIS — Z79899 Other long term (current) drug therapy: Secondary | ICD-10-CM

## 2013-02-24 HISTORY — DX: Post-traumatic stress disorder, unspecified: F43.10

## 2013-02-24 HISTORY — DX: Anxiety disorder, unspecified: F41.9

## 2013-02-24 HISTORY — DX: Major depressive disorder, single episode, unspecified: F32.9

## 2013-02-24 HISTORY — DX: Depression, unspecified: F32.A

## 2013-02-24 LAB — RAPID URINE DRUG SCREEN, HOSP PERFORMED
Barbiturates: NOT DETECTED
Benzodiazepines: NOT DETECTED
Cocaine: NOT DETECTED
Tetrahydrocannabinol: POSITIVE — AB

## 2013-02-24 LAB — CBC
HCT: 41.1 % (ref 36.0–46.0)
MCV: 94.7 fL (ref 78.0–100.0)
RBC: 4.34 MIL/uL (ref 3.87–5.11)
RDW: 13.2 % (ref 11.5–15.5)
WBC: 5.7 10*3/uL (ref 4.0–10.5)

## 2013-02-24 LAB — COMPREHENSIVE METABOLIC PANEL
AST: 14 U/L (ref 0–37)
Albumin: 4.3 g/dL (ref 3.5–5.2)
Alkaline Phosphatase: 71 U/L (ref 39–117)
BUN: 12 mg/dL (ref 6–23)
CO2: 26 mEq/L (ref 19–32)
Chloride: 100 mEq/L (ref 96–112)
Creatinine, Ser: 0.66 mg/dL (ref 0.50–1.10)
GFR calc non Af Amer: >90 mL/min (ref >90)
Potassium: 3.2 mEq/L — ABNORMAL LOW (ref 3.5–5.1)
Total Bilirubin: 0.3 mg/dL (ref 0.3–1.2)

## 2013-02-24 LAB — GLUCOSE, CAPILLARY: Glucose-Capillary: 90 mg/dL (ref 70–99)

## 2013-02-24 LAB — ACETAMINOPHEN LEVEL
Acetaminophen (Tylenol), Serum: <15 ug/mL (ref 10–30)
Acetaminophen (Tylenol), Serum: 20.8 ug/mL (ref 10–30)

## 2013-02-24 LAB — ETHANOL: Alcohol, Ethyl (B): <11 mg/dL (ref 0–11)

## 2013-02-24 MED ORDER — MAGNESIUM HYDROXIDE 400 MG/5ML PO SUSP: 30.0000 mL | Freq: Every day | ORAL | Status: DC | PRN

## 2013-02-24 MED ORDER — IBUPROFEN 200 MG PO TABS: 600.0000 mg | ORAL_TABLET | Freq: Three times a day (TID) | ORAL | Status: DC | PRN

## 2013-02-24 MED ORDER — SODIUM CHLORIDE 0.9 % IV BOLUS (SEPSIS)
1000.0000 mL | Freq: Once | INTRAVENOUS | Status: AC
  Administered 2013-02-24: 1000 mL via INTRAVENOUS

## 2013-02-24 MED ORDER — HYDROXYZINE HCL 50 MG PO TABS
50.0000 mg | ORAL_TABLET | Freq: Every evening | ORAL | Status: DC | PRN
  Administered 2013-02-25 – 2013-02-28 (×4): 50 mg via ORAL
  Filled 2013-02-24: qty 14

## 2013-02-24 MED ORDER — ACETAMINOPHEN 325 MG PO TABS
650.0000 mg | ORAL_TABLET | Freq: Four times a day (QID) | ORAL | Status: DC | PRN
Start: 2013-02-24 — End: 2013-03-01
  Administered 2013-02-27: 650 mg via ORAL

## 2013-02-24 MED ORDER — ALUM & MAG HYDROXIDE-SIMETH 200-200-20 MG/5ML PO SUSP: 30.0000 mL | ORAL | Status: DC | PRN

## 2013-02-24 NOTE — Progress Notes (Signed)
[facility] CL provided patient with a [employer] card application, highlighting [facility] [place].

## 2013-02-24 NOTE — ED Provider Notes (Signed)
Repeat tylenol level wnl.  Will transfer to [facility]  Gwyneth Sprout, MD 02/24/13 [phone]

## 2013-02-24 NOTE — Progress Notes (Signed)
Report received from m. Pincus Badder RN on new admission to [facility]. Writer entered patients room and observed her lying in bed with the covers pulled over her head. Patient appeared asleep and respirations were observed through th covers as even and unlabored, no distress noted. Safety maintained on unit with 15 min checks, will continue to monitor.
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
## 2013-02-24 NOTE — Progress Notes (Signed)
CSW received consult from EDP. CSW informed Psychiatric NP and oncoming CSW will complete [facility] assessment  .Catha Gosselin, Theresia Majors  [phone] .02/24/2013 1516pm

## 2013-02-24 NOTE — ED Notes (Signed)
Report called to [facility] RN, Marcelino Duster
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
## 2013-02-24 NOTE — ED Notes (Signed)
Bedside report received from previous RN, Lauren. 

## 2013-02-24 NOTE — Consult Note (Signed)
Patient discussed, needs inpatient psychiatric admission. Patient is depressed and suicidal. Patient can be transferred to [facility] once medically cleared
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
## 2013-02-24 NOTE — ED Notes (Signed)
Patient belongings: 1 pair of athletic shoes, 1 bra, 1 pair of shocks, 1 pair of leggings, 1 shirt, 1 purse (1 ring, 1 lighter, perfume, 1 ID card). Patient's 1 bag of belongings is located at nurse's station across from room 4.

## 2013-02-24 NOTE — ED Notes (Signed)
Patient in blue scrubs and red socks. Patient and belongings both wanded by security.  

## 2013-02-24 NOTE — ED Notes (Signed)
Poison Control called recommended a 4 hr tylenol level and place in observation for 4-6 hours.

## 2013-02-24 NOTE — ED Provider Notes (Addendum)
CSN: [id number]     Arrival date & time 02/24/13  1305 History     First MD Initiated Contact with Patient 02/24/13 1325     Chief Complaint  Patient presents with  . Suicidal  . Drug Overdose   (Consider location/radiation/quality/duration/timing/severity/associated sxs/prior Treatment) HPI  Patient presents after intentional ingestion of Tylenol and Dimetapp.  She took 15 tablets of 500 mg Tylenol and a partial bottle of Dimetapp approximately 2 hours prior to my evaluation. She has no current physical complaints.  She endorses that this was a suicide attempt.  Soon after ingesting the medication she increased vomiting. She has a history of depression, anxiety, PTSD.  She is not currently taking any medication for these. She has no medical problems.  Past Medical History  Diagnosis Date  . Hyperemesis gravidarum   . Anxiety   . PTSD (post-traumatic stress disorder)   . Depression    Past Surgical History  Procedure Laterality Date  . Mouth surgery  2005  . Tubal ligation     No family history on file. History  Substance Use Topics  . Smoking status: Never Smoker   . Smokeless tobacco: Not on file  . Alcohol Use: No   OB History   Grav Para Term Preterm Abortions TAB SAB Ect Mult Living   5 4 4  0 0 0 0 0 0 4     Review of Systems  Constitutional:       Per HPI, otherwise negative  HENT:       Per HPI, otherwise negative  Respiratory:       Per HPI, otherwise negative  Cardiovascular:       Per HPI, otherwise negative  Gastrointestinal: Positive for vomiting. Negative for nausea.  Endocrine:       Negative aside from HPI  Genitourinary:       Neg aside from HPI   Musculoskeletal:       Per HPI, otherwise negative  Neurological: Negative for syncope and weakness.  Psychiatric/Behavioral: Positive for suicidal ideas and self-injury. The patient is nervous/anxious.     Allergies  Hydrocodone; Latex; and Tape  Home Medications   Current Outpatient Rx   Name  Route  Sig  Dispense  Refill  . acetaminophen (TYLENOL) 500 MG tablet   Oral   Take 500 mg by mouth every 6 (six) hours as needed for pain.         . brompheniramine-pseudoephedrine-dextromethorphan (DIMETAPP DM) 15-1-5 MG/5ML ELIX   Oral   Take 5 mLs by mouth every 6 (six) hours as needed.          BP 140/83  Pulse 86  Temp(Src) 98.5 F (36.9 C) (Oral)  Resp 18  SpO2 100%  LMP 02/19/2013 Physical Exam  Nursing note and vitals reviewed. Constitutional: She is oriented to person, place, and time. She appears well-developed and well-nourished. No distress.  HENT:  Head: Normocephalic and atraumatic.  Eyes: Conjunctivae and EOM are normal.  Cardiovascular: Normal rate and regular rhythm.   Pulmonary/Chest: Effort normal and breath sounds normal. No stridor. No respiratory distress.  Abdominal: She exhibits no distension.  Musculoskeletal: She exhibits no edema.  Neurological: She is alert and oriented to person, place, and time. No cranial nerve deficit.  Skin: Skin is warm and dry.  Psychiatric: She has a normal mood and affect.    ED Course   Procedures (including critical care time)  Labs Reviewed  CBC  GLUCOSE, CAPILLARY  COMPREHENSIVE METABOLIC PANEL  ETHANOL  ACETAMINOPHEN LEVEL  SALICYLATE LEVEL  URINE RAPID DRUG SCREEN (HOSP PERFORMED)  POCT PREGNANCY, URINE   No results found. No diagnosis found. Pulse oximetry 99% remainder normal   Date: 02/24/2013  Rate: 69  Rhythm: normal sinus rhythm  QRS Axis: normal  Intervals: normal  ST/T Wave abnormalities: normal  Conduction Disutrbances:none  Narrative Interpretation:   Old EKG Reviewed: none available Unremarkable ECG   Initial labs notable for no sig elevation of acetaminophen (or other sig findings)  3:12 PM I discussed the patient's case with our behavioral health team. MDM  Patient presents after intentional ingestion of Tylenol and cough medicine.  Patient is hemodynamically  stable, awake and alert, appropriately interactive.  Patient is here voluntarily, and cooperative. Patient's initial labs demonstrated no significant findings.  Patient will require one additional acetaminophen level, and if this is unremarkable, she is medically clear for further psychiatric evaluation. I have already discussed the case with our behavioral health team, and the patient is already being evaluated.  Gerhard Munch, MD 02/24/13 1515  Gerhard Munch, MD 02/24/13 [phone]

## 2013-02-24 NOTE — ED Notes (Addendum)
Pt reports taking 15-extra strength tylenols and drinking cough syrup x1 hour.  Pt has hx of depression, ptsd, and anxiety.  Reports she is not taking any psychotropics. Pt sts she is SI and has been under a lot of stress lately due to loss of job and family issues with sister.  Pt appears calm and cooperative.  Flat affect. Pt reports chest pain and headache at this time.

## 2013-02-24 NOTE — Telephone Encounter (Signed)
Poison Control Center called to check on pt who was overdose, pt discharged. They needed her tylenol level which was given, it was 20.8.

## 2013-02-24 NOTE — Tx Team (Signed)
Initial Interdisciplinary Treatment Plan  PATIENT STRENGTHS: (choose at least two) Ability for insight Average or above average intelligence Capable of independent living Communication skills Physical Health Supportive family/friends  PATIENT STRESSORS: Financial difficulties Occupational concerns   PROBLEM LIST: Problem List/Patient Goals Date to be addressed Date deferred Reason deferred Estimated date of resolution  Depression 02/24/13     Suicide Risk 02/24/13                                                DISCHARGE CRITERIA:  Improved stabilization in mood, thinking, and/or behavior Reduction of life-threatening or endangering symptoms to within safe limits Verbal commitment to aftercare and medication compliance  PRELIMINARY DISCHARGE PLAN: Outpatient therapy Return to previous living arrangement  PATIENT/FAMIILY INVOLVEMENT: This treatment plan has been presented to and reviewed with the patient, Angela Mosley, and/or family member.  The patient and family have been given the opportunity to ask questions and make suggestions.  Angela Mosley 02/24/2013, 9:42 PM

## 2013-02-24 NOTE — Progress Notes (Signed)
Pt confirms she does not have a pcp Has only an OB GYN  CM spoke with pt who confirms self pay [facility] resident with no pcp. CM discussed and provided written information for self pay pcps, importance of pcp for f/u care, www.needymeds.org, discounted pharmacies and other [place] resources such as financial assistance, DSS and  health department  Reviewed resources for [employer] self pay pcps like [facility], family medicine at [address], [facility] family practice, general medical clinics, [facility] urgent care plus others, [facility] out patient pharmacies and housing Pt voiced understanding and appreciation of resources provided

## 2013-02-25 DIAGNOSIS — F1210 Cannabis abuse, uncomplicated: Secondary | ICD-10-CM | POA: Diagnosis present

## 2013-02-25 DIAGNOSIS — F4310 Post-traumatic stress disorder, unspecified: Secondary | ICD-10-CM | POA: Diagnosis present

## 2013-02-25 DIAGNOSIS — F332 Major depressive disorder, recurrent severe without psychotic features: Secondary | ICD-10-CM | POA: Diagnosis present

## 2013-02-25 MED ORDER — FLUOXETINE HCL 20 MG PO CAPS
20.0000 mg | ORAL_CAPSULE | Freq: Every day | ORAL | Status: DC
  Administered 2013-02-25 – 2013-03-01 (×5): 20 mg via ORAL
  Filled 2013-02-25: qty 14
  Filled 2013-02-25 (×6): qty 1
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
## 2013-02-25 NOTE — Progress Notes (Signed)
D Angela Mosley has slowly come ou tof her shell today. She makes good eye contact. She is pleasant and cooperative and beginning to feel at home in the  Milieu.   A She completes her AM self  invnetory and on it she writes she rates her depression and hopelessness "8/5" and says her DC plan is to build a relationship with her doctor.   R Safety is in place and POC cont.

## 2013-02-25 NOTE — [facility] Group Notes (Signed)
[facility] Group Notes:  (Clinical Social Work)  02/25/2013   3:00-4:00PM  Summary of Progress/Problems:   The main focus of today's process group was for the patient to identify ways in which they have sabotaged their own mental health wellness/recovery.  Motivational interviewing was used to explore the reasons they engage in this behavior, and reasons they may have for wanting to change.  The Stages of Change were explained to the group using a handout, and patients identified where they are with regard to changing self-defeating behaviors.  The patient expressed that she self-sabotages with her mouth running because of her anger.  She feels she is in Preparation Stage, that she is making plans for changes which started by coming to the hospital.  Type of Therapy:  Process Group  Participation Level:  Active  Participation Quality:  Attentive and Sharing  Affect:  Blunted, Defensive and Irritable  Cognitive:  Appropriate  Insight:  Developing/Improving  Engagement in Therapy:  Engaged  Modes of Intervention:  Education, Motivational Interviewing   Ambrose Mantle, LCSW 02/25/2013, 5:38 PM

## 2013-02-26 MED ORDER — NAPROXEN 500 MG PO TABS
500.0000 mg | ORAL_TABLET | Freq: Two times a day (BID) | ORAL | Status: DC
  Administered 2013-02-26 – 2013-02-27 (×3): 500 mg via ORAL
  Filled 2013-02-26 (×5): qty 1

## 2013-02-26 NOTE — Progress Notes (Addendum)
Angela Mosley sarted the day off irritable and upset...she was irritable because she said her feelings got hurt and then she became agitated because she said she felt like the other patients were being loud and obnoxious in the dayroom.She allowed this nurse to process with her, to help her identify her feelings ( and not focus on who she wanted to get back at).    A She demonstrates  Growing insight into understanding how her depression affects her feelings afects her behavior.    R She denies SI . She is engaged in her POC. She is actively trying to develop healthier coping skills.

## 2013-02-26 NOTE — [facility] Group Notes (Signed)
[facility] Group Notes:  (Clinical Social Work)  02/26/2013   3:00-4:00PM  Summary of Progress/Problems:   The main focus of today's process group was to   identify the patient's current support system and decide on other supports that can be put in place.  The picture on workbook was used to discuss why additional supports are needed, and a hand-out was distributed with four definitions/levels of support, then used to talk about how patients have given and received all different kinds of support.  An emphasis was placed on using counselor, doctor, therapy groups, 12-step groups, and problem-specific support groups to expand supports.  The patient identified her current supports as family, friends and church.  She was very open to the idea of adding more supports, and found the definitions helpful.  She left the room briefly and was gone during the discussion of what each patient is willing to add.  Type of Therapy:  Process Group  Participation Level:  Active  Participation Quality:  Attentive and Sharing  Affect:  Blunted  Cognitive:  Oriented  Insight:  Developing/Improving  Engagement in Therapy:  Engaged  Modes of Intervention:  Education,  Support and [employer], LCSW 02/26/2013, 2:45 PM

## 2013-02-26 NOTE — Progress Notes (Signed)
Psychoeducational Group Note  Date:  02/26/2013 Time:  1015  Group Topic/Focus:  Making Healthy Choices:   The focus of this group is to help patients identify negative/unhealthy choices they were using prior to admission and identify positive/healthier coping strategies to replace them upon discharge.  Participation Level:  Active  Participation Quality:  Appropriate  Affect:  Appropriate  Cognitive:  Appropriate  Insight:  Improving  Engagement in Group:  Engaged  Additional Comments:    Karra Pink A 02/26/2013 

## 2013-02-26 NOTE — Progress Notes (Signed)
Adult Psychoeducational Group Note  Date:  02/25/2013 Time:  800 pm  Group Topic/Focus:  Wrap-Up Group:   The focus of this group is to help patients review their daily goal of treatment and discuss progress on daily workbooks.  Participation Level:  Active  Participation Quality:  Appropriate  Affect:  Appropriate  Cognitive:  Appropriate  Insight: Appropriate  Engagement in Group:  Engaged  Modes of Intervention:  Discussion  Additional Comments:  Pt reported that she arrived to the hospital today. Pt expressed that she felt overwhelmed.  Pt reported that she wants to begin working on her "I feel" statements.  Marvis Moeller A 02/26/2013, 12:32 AM

## 2013-02-26 NOTE — Progress Notes (Signed)
Psychoeducational Group Note  Psychoeducational Group Note  Date: 02/26/2013 Time:  0930  Group Topic/Focus:  Gratefulness:  The focus of this group is to help patients identify what two things they are most grateful for in their lives. What helps ground them and to center them on their work to their recovery.  Participation Level:  Active  Participation Quality:  Appropriate  Affect:  WNL  Cognitive:  Alert  Insight:  improving  Engagement in Group:  Engaged  Additional Comments:    Chane Magner A  

## 2013-02-26 NOTE — Progress Notes (Signed)
[facility] MD Progress Note  02/26/2013 2:06 PM Angela Mosley  MRN:  [id number] Subjective:  Patient stated that she continued to be depressed, anxious and also has a migraine headache for the last 2 days without relief. Patient stated she cannot take Tylenol because she overdosed Tylenol PM on the admission. Patient continued to be in emotional pain. She stated that it was as 5/10 in anxiety as 5/10. Patient contracts for safety in Hospital setting and trying to participate in individual and group therapies  Diagnosis:  Axis I: Major Depression, Recurrent severe  ADL's:  Intact  Sleep: Fair  Appetite:  Fair  Suicidal Ideation:  Patient has made suicide attempt by overdose on medications and continued to feel unsafe Homicidal Ideation:  Denied AEB (as evidenced by):  Psychiatric Specialty Exam: ROS  Blood pressure 117/81, pulse 65, temperature 98 F (36.7 C), temperature source Oral, resp. rate 16, height 5\' 4"  (1.626 m), weight 79.379 kg (175 lb), last menstrual period 02/19/2013.Body mass index is 30.02 kg/(m^2).  General Appearance: Guarded  Eye Contact::  Minimal  Speech:  Clear and Coherent  Volume:  Decreased  Mood:  Anxious and Depressed  Affect:  Congruent, Depressed and Flat  Thought Process:  Goal Directed and Intact  Orientation:  Full (Time, Place, and Person)  Thought Content:  Rumination  Suicidal Thoughts:  Yes.  with intent/plan  Homicidal Thoughts:  No  Memory:  Immediate;   Fair  Judgement:  Impaired  Insight:  Lacking  Psychomotor Activity:  Psychomotor Retardation  Concentration:  Fair  Recall:  Fair  Akathisia:  NA  Handed:  Right  AIMS (if indicated):     Assets:  Communication Skills Desire for Improvement Physical Health Resilience Social Support  Sleep:  Number of Hours: 5   Current Medications: Current Facility-Administered Medications  Medication Dose Route Frequency Provider Last Rate Last Dose  . acetaminophen (TYLENOL) tablet 650 mg  650  mg Oral Q6H PRN Earney Navy, NP      . alum & mag hydroxide-simeth (MAALOX/MYLANTA) 200-200-20 MG/5ML suspension 30 mL  30 mL Oral Q4H PRN Earney Navy, NP      . FLUoxetine (PROZAC) capsule 20 mg  20 mg Oral Daily Nehemiah Settle, MD   20 mg at 02/26/13 0839  . hydrOXYzine (ATARAX/VISTARIL) tablet 50 mg  50 mg Oral QHS PRN Earney Navy, NP   50 mg at 02/25/13 2152  . magnesium hydroxide (MILK OF MAGNESIA) suspension 30 mL  30 mL Oral Daily PRN Earney Navy, NP      . naproxen (NAPROSYN) tablet 500 mg  500 mg Oral BID WC Nehemiah Settle, MD        Lab Results:  Results for orders placed during the hospital encounter of 02/24/13 (from the past 48 hour(s))  TSH     Status: None   Collection Time    02/25/13  7:28 PM      Result Value Range   TSH 1.035  0.350 - 4.500 uIU/mL  ACETAMINOPHEN LEVEL     Status: None   Collection Time    02/25/13  7:28 PM      Result Value Range   Acetaminophen (Tylenol), Serum <15.0  10 - 30 ug/mL   Comment:            THERAPEUTIC CONCENTRATIONS VARY     SIGNIFICANTLY. A RANGE OF 10-30     ug/mL MAY BE AN EFFECTIVE     CONCENTRATION FOR MANY PATIENTS.  HOWEVER, SOME ARE BEST TREATED     AT CONCENTRATIONS OUTSIDE THIS     RANGE.     ACETAMINOPHEN CONCENTRATIONS     >150 ug/mL AT 4 HOURS AFTER     INGESTION AND >50 ug/mL AT 12     HOURS AFTER INGESTION ARE     OFTEN ASSOCIATED WITH TOXIC     REACTIONS.    Physical Findings: AIMS: Facial and Oral Movements Muscles of Facial Expression: None, normal Lips and Perioral Area: None, normal Jaw: None, normal Tongue: None, normal,Extremity Movements Upper (arms, wrists, hands, fingers): None, normal Lower (legs, knees, ankles, toes): None, normal, Trunk Movements Neck, shoulders, hips: None, normal, Overall Severity Severity of abnormal movements (highest score from questions above): None, normal Incapacitation due to abnormal movements: None,  normal Patient's awareness of abnormal movements (rate only patient's report): No Awareness, Dental Status Current problems with teeth and/or dentures?: No Does patient usually wear dentures?: No  CIWA:  CIWA-Ar Total: 0 COWS:  COWS Total Score: 0  Treatment Plan Summary: Daily contact with patient to assess and evaluate symptoms and progress in treatment Medication management  Plan: Continue fluoxetine 20 mg daily Start Naprosyn 500 mg twice daily for headache 1. Continue for crisis management and stabilization. 2. Medication management to reduce current symptoms to base line and improve the patient's overall level of functioning. 3. Treat health problems as indicated. 4. Develop treatment plan to decrease risk of relapse upon discharge and to reduce the need for readmission. 5. Psycho-social education regarding relapse prevention and self care. 6. Health care follow up as needed for medical problems. 7. Restart home medications where appropriate.   Medical Decision Making Problem Points:  Established problem, worsening (2), Review of last therapy session (1) and Review of psycho-social stressors (1) Data Points:  Review or order clinical lab tests (1) Review or order medicine tests (1) Review of medication regiment & side effects (2) Review of new medications or change in dosage (2)  I certify that inpatient services furnished can reasonably be expected to improve the patient's condition.   Nehemiah Settle., M.D. 02/26/2013, 2:06 PM

## 2013-02-26 NOTE — Progress Notes (Signed)
[facility] Group Notes:  (Nursing/MHT/Case Management/Adjunct)  Date:  02/26/2013  Time:  2000  Type of Therapy:  Psychoeducational Skills  Participation Level:  Active  Participation Quality:  Attentive  Affect:  Flat  Cognitive:  Appropriate  Insight:  Improving  Engagement in Group:  Developing/Improving  Modes of Intervention:  Education  Summary of Progress/Problems: The patient verbalized that she didn't have a very good day since she had a migraine. She also mentioned that she slept for much of the day. Her goal for tomorrow is to work on how "I feel".   Hazle Coca S 02/26/2013, 10:19 PM

## 2013-02-26 NOTE — Progress Notes (Signed)
Writer spoke with patient and she reports that her day started off bad and she felt very overwhelmed because this was her first day here. Patient reports going to the cafeteria this morning but ended up crying because of feeling this way. Patient reports that her day has gotten better and attended group this evening. Patient requested prn visteril to aid with sleep which she received. Patient reports that her sister and her boyfriend visited her today. Patient spoke briefly of her godmothers death. Writer offered support and encouragement, safety maintained on unit, will monitor effectiveness of medication.

## 2013-02-27 MED ORDER — KETOROLAC TROMETHAMINE 30 MG/ML IJ SOLN
30.0000 mg | Freq: Once | INTRAMUSCULAR | Status: AC
  Administered 2013-02-27: 30 mg via INTRAMUSCULAR
  Filled 2013-02-27 (×2): qty 1

## 2013-02-27 MED ORDER — ADULT MULTIVITAMIN W/MINERALS CH
1.0000 | ORAL_TABLET | Freq: Every day | ORAL | Status: DC
  Administered 2013-02-28 – 2013-03-01 (×2): 1 via ORAL
  Filled 2013-02-27 (×5): qty 1

## 2013-02-27 MED ORDER — NAPROXEN 500 MG PO TABS
500.0000 mg | ORAL_TABLET | Freq: Three times a day (TID) | ORAL | Status: DC
  Administered 2013-02-27 – 2013-02-28 (×2): 500 mg via ORAL
  Filled 2013-02-27 (×5): qty 1

## 2013-02-27 NOTE — Progress Notes (Signed)
Writer spoke with patient and she reports that she had an ok day but has had a migraine most of the day, patient received her 2nd of naproxen for this. Patient attended group this evening and participated. Patient denies si/hi/a/v hallucination. Writer will monitor effectiveness of medication. Support and encouragement offered, safety maintained on unit, will continue to monitor.

## 2013-02-27 NOTE — Progress Notes (Signed)
Patient ID: Angela Mosley, female   DOB: 19-Mar-1986, 27 y.o.   MRN: [id number] [facility] MD Progress Note  02/27/2013 1:05 PM Angela Mosley  MRN:  [id number]  Subjective:  Patient has been compliant with treatment program and medication management. She has taken naprosyn for headache and feels positively responded but continue to has some headache and taken tylenol. She has rated her depressed 2/10, anxious 2/10 especially about going home. She has no job and financial problems. Patient contracts for safety in hospital setting and participating in individual and group therapies  Diagnosis:  Axis I: Major Depression, Recurrent severe  ADL's:  Intact  Sleep: Fair  Appetite:  Fair  Suicidal Ideation:  Patient has made suicide attempt by overdose on medications and continued to feel unsafe Homicidal Ideation:  Denied AEB (as evidenced by):  Psychiatric Specialty Exam: ROS  Blood pressure 105/72, pulse 80, temperature 98.2 F (36.8 C), temperature source Oral, resp. rate 17, height 5\' 4"  (1.626 m), weight 79.379 kg (175 lb), last menstrual period 02/19/2013.Body mass index is 30.02 kg/(m^2).  General Appearance: Guarded  Eye Contact::  Minimal  Speech:  Clear and Coherent  Volume:  Decreased  Mood:  Anxious and Depressed  Affect:  Congruent, Depressed and Flat  Thought Process:  Goal Directed and Intact  Orientation:  Full (Time, Place, and Person)  Thought Content:  Rumination  Suicidal Thoughts:  Yes.  with intent/plan  Homicidal Thoughts:  No  Memory:  Immediate;   Fair  Judgement:  Impaired  Insight:  Lacking  Psychomotor Activity:  Psychomotor Retardation  Concentration:  Fair  Recall:  Fair  Akathisia:  NA  Handed:  Right  AIMS (if indicated):     Assets:  Communication Skills Desire for Improvement Physical Health Resilience Social Support  Sleep:  Number of Hours: 5.25   Current Medications: Current Facility-Administered Medications  Medication Dose Route  Frequency Provider Last Rate Last Dose  . acetaminophen (TYLENOL) tablet 650 mg  650 mg Oral Q6H PRN Earney Navy, NP   650 mg at 02/27/13 1302  . alum & mag hydroxide-simeth (MAALOX/MYLANTA) 200-200-20 MG/5ML suspension 30 mL  30 mL Oral Q4H PRN Earney Navy, NP      . FLUoxetine (PROZAC) capsule 20 mg  20 mg Oral Daily Nehemiah Settle, MD   20 mg at 02/27/13 0743  . hydrOXYzine (ATARAX/VISTARIL) tablet 50 mg  50 mg Oral QHS PRN Earney Navy, NP   50 mg at 02/26/13 2127  . magnesium hydroxide (MILK OF MAGNESIA) suspension 30 mL  30 mL Oral Daily PRN Earney Navy, NP      . naproxen (NAPROSYN) tablet 500 mg  500 mg Oral BID WC Nehemiah Settle, MD   500 mg at 02/27/13 7829    Lab Results:  Results for orders placed during the hospital encounter of 02/24/13 (from the past 48 hour(s))  TSH     Status: None   Collection Time    02/25/13  7:28 PM      Result Value Range   TSH 1.035  0.350 - 4.500 uIU/mL  ACETAMINOPHEN LEVEL     Status: None   Collection Time    02/25/13  7:28 PM      Result Value Range   Acetaminophen (Tylenol), Serum <15.0  10 - 30 ug/mL   Comment:            THERAPEUTIC CONCENTRATIONS VARY     SIGNIFICANTLY. A RANGE OF 10-30  ug/mL MAY BE AN EFFECTIVE     CONCENTRATION FOR MANY PATIENTS.     HOWEVER, SOME ARE BEST TREATED     AT CONCENTRATIONS OUTSIDE THIS     RANGE.     ACETAMINOPHEN CONCENTRATIONS     >150 ug/mL AT 4 HOURS AFTER     INGESTION AND >50 ug/mL AT 12     HOURS AFTER INGESTION ARE     OFTEN ASSOCIATED WITH TOXIC     REACTIONS.    Physical Findings: AIMS: Facial and Oral Movements Muscles of Facial Expression: None, normal Lips and Perioral Area: None, normal Jaw: None, normal Tongue: None, normal,Extremity Movements Upper (arms, wrists, hands, fingers): None, normal Lower (legs, knees, ankles, toes): None, normal, Trunk Movements Neck, shoulders, hips: None, normal, Overall Severity Severity of  abnormal movements (highest score from questions above): None, normal Incapacitation due to abnormal movements: None, normal Patient's awareness of abnormal movements (rate only patient's report): No Awareness, Dental Status Current problems with teeth and/or dentures?: No Does patient usually wear dentures?: No  CIWA:  CIWA-Ar Total: 0 COWS:  COWS Total Score: 0  Treatment Plan Summary: Daily contact with patient to assess and evaluate symptoms and progress in treatment Medication management  Plan: Continue fluoxetine 20 mg daily Increase Naprosyn 500 mg TID/PRN for headache 1. Continue for crisis management and stabilization. 2. Medication management to reduce current symptoms to base line and improve the patient's overall level of functioning. 3. Treat health problems as indicated. 4. Develop treatment plan to decrease risk of relapse upon discharge and to reduce the need for readmission. 5. Psycho-social education regarding relapse prevention and self care. 6. Health care follow up as needed for medical problems. 7. Restart home medications where appropriate.   Medical Decision Making Problem Points:  Established problem, worsening (2), Review of last therapy session (1) and Review of psycho-social stressors (1) Data Points:  Review or order clinical lab tests (1) Review or order medicine tests (1) Review of medication regiment & side effects (2) Review of new medications or change in dosage (2)  I certify that inpatient services furnished can reasonably be expected to improve the patient's condition.   Nehemiah Settle., M.D. 02/27/2013, 1:05 PM

## 2013-02-27 NOTE — Progress Notes (Signed)
Adult Psychoeducational Group Note  Date:  02/27/2013 Time:  12:03 PM  Group Topic/Focus:  Wellness Toolbox:   The focus of this group is to discuss various aspects of wellness, balancing those aspects and exploring ways to increase the ability to experience wellness.  Patients will create a wellness toolbox for use upon discharge.  Participation Level:  Active  Participation Quality:  Appropriate  Affect:  Appropriate  Cognitive:  Appropriate  Insight: Good and Improving  Engagement in Group:  Engaged  Modes of Intervention:  Clarification, Discussion and Education  Additional Comments:  Pt attended and actively participated in group with MHT. Pt discussed her goals for today, which are controlling her anger and learning to cope with inappropriate situations.   Lorin Mercy 02/27/2013, 12:03 PM

## 2013-02-27 NOTE — Progress Notes (Signed)
Adult Psychoeducational Group Note  Date:  02/27/2013 Time:  10:12 PM  Group Topic/Focus:  Goals Group:   The focus of this group is to help patients establish daily goals to achieve during treatment and discuss how the patient can incorporate goal setting into their daily lives to aide in recovery.  Participation Level:  Active  Participation Quality:  Appropriate  Affect:  Appropriate  Cognitive:  Appropriate  Insight: Appropriate and Good  Engagement in Group:  Engaged  Modes of Intervention:  Discussion  Additional Comments:  Pt stated that day was good talked with Dr. Today and is ready for discharge in tomorrow.  Terie Purser R 02/27/2013, 10:12 PM

## 2013-02-27 NOTE — [facility] Group Notes (Signed)
[facility] LCSW Group Therapy  02/27/2013  1:15 PM   Type of Therapy:  Group Therapy  Participation Level:  Active  Participation Quality:  Appropriate and Attentive  Affect:  Appropriate, Flat and Depressed  Cognitive:  Alert and Appropriate  Insight:  Developing/Improving and Engaged  Engagement in Therapy:  Developing/Improving and Engaged  Modes of Intervention:  Clarification, Confrontation, Discussion, Education, Exploration, Limit-setting, Orientation, Problem-solving, Rapport Building, Dance movement psychotherapist, Socialization and Support  Summary of Progress/Problems: Pt identified obstacles faced currently and processed barriers involved in overcoming these obstacles. Pt identified steps necessary for overcoming these obstacles and explored motivation (internal and external) for facing these difficulties head on. Pt further identified one area of concern in their lives and chose a goal to focus on for today.  Pt shared that the obstacle she faced was accepting that she has depression and anxiety.  Pt was able to process her perception of mental illness and how it was hard for her to accept that she has mental health and needs to cope with her illness now.  Pt actively participated and was engaged in group discussion.    Reyes Ivan, LCSWA 02/27/2013 2:21 PM

## 2013-02-27 NOTE — Tx Team (Signed)
Interdisciplinary Treatment Plan Update (Adult)  Date: 02/27/2013  Time Reviewed:  9:45 AM  Progress in Treatment: Attending groups: Yes Participating in groups:  Yes Taking medication as prescribed:  Yes Tolerating medication:  Yes Family/Significant othe contact made: CSW assessing  Patient understands diagnosis:  Yes Discussing patient identified problems/goals with staff:  Yes Medical problems stabilized or resolved:  Yes Denies suicidal/homicidal ideation: Yes Issues/concerns per patient self-inventory:  Yes Other:  New problem(s) identified: N/A  Discharge Plan or Barriers: CSW assessing for appropriate referrals.  Reason for Continuation of Hospitalization: Anxiety Depression Medication Stabilization  Comments: N/A  Estimated length of stay: 3-5 days  For review of initial/current patient goals, please see plan of care.  Attendees: Patient:     Family:     Physician:  Dr. Johnalagadda 02/27/2013 12:15 PM   Nursing:   Brittany Tyson, RN 02/27/2013 12:15 PM   Clinical Social Worker:  Kallan Bischoff Horton, LCSWA 02/27/2013 12:15 PM   Other: Neil Mashburn, PA  02/27/2013 12:15 PM   Other:  Maseta Dorley, MA care coordination 02/27/2013 12:15 PM   Other:  Beverly Knight, RN 02/27/2013 12:15 PM   Other:  Carol Davis, RN 02/27/2013 12:15 PM  Other:    Other:    Other:    Other:    Other:    Other:     Scribe for Treatment Team:   Horton, Maxfield Gildersleeve Nicole, 02/27/2013 12:15 PM    

## 2013-02-27 NOTE — [facility] Group Notes (Addendum)
[facility] LCSW Aftercare Discharge Planning Group Note   02/27/2013 8:45 AM  Participation Quality:  Alert and Appropriate   Mood/Affect:  Appropriate and Anxious  Depression Rating:  1  Anxiety Rating:  3  Thoughts of Suicide:  Pt denies SI/HI  Will you contract for safety?   Yes  Current AVH:  Pt denies  Plan for Discharge/Comments:  Pt attended discharge planning group and actively participated in group.  CSW provided pt with today's workbook.  Pt states that she lives in [place] and will follow up at [facility] for medication management and therapy.  No further needs voiced by pt at this time.    Transportation Means: Pt reports access to transportation  Supports: Pt names her sister as supportive  Reyes Ivan, LCSWA 02/27/2013 9:40 AM

## 2013-02-27 NOTE — Progress Notes (Addendum)
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  Pt identified as at risk on the Malnutrition Screen Tool  INTERVENTION: 1. Educated patient on the importance of nutrition and encouraged intake of food and beverages. 2. Discussed weight goals. 3. Supplements: MVI daily  NUTRITION DIAGNOSIS: Unintentional weight loss related to sub-optimal intake as evidenced by pt report.   Goal: Pt to meet >/= 90% of their estimated nutrition needs.  Monitor:  PO intake  Assessment:  Patient admitted with depression and suicide attempt.  Has 3 children ages 77, 41, and almost 2.  Was on TPN during pregnancy secondary to hyperemesis.  States she is eating well here (althought just ate 2 servings of chicken at lunch.  Reports very poor intake prior to admit.  May go 2-3 days and only eat once.  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  Poo intake secondary to poor appetite and taste alterations.  Patient reports weight loss of 60 lbs in the past year.    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  27 y.o. female  Height: Ht Readings from Last 1 Encounters:  02/24/13 5\' 4"  (1.626 m)    Weight: Wt Readings from Last 1 Encounters:  02/24/13 175 lb (79.379 kg)    Weight Hx: Wt Readings from Last 10 Encounters:  02/24/13 175 lb (79.379 kg)  06/02/11 221 lb 12.8 oz (100.608 kg)    BMI:  Body mass index is 30.02 kg/(m^2). Pt meets criteria for obesity grade 1 based on current BMI.  Estimated Nutritional Needs: Kcal: 25-30 kcal/kg Protein: > 1 gram protein/kg Fluid: 1 ml/kcal  Diet Order: General Pt is also offered choice of unit snacks mid-morning and mid-afternoon.  Pt is eating as desired.   Lab results  and medications reviewed.   Oran Rein, RD, LDN Clinical Inpatient Dietitian Pager:  [phone] Weekend and after hours pager:  [phone]

## 2013-02-27 NOTE — Progress Notes (Signed)
D: Patient denies SI/HI and A/V hallucinations; patient declined self inventory;patient reports that she is ready to go home   A: Monitored q 15 minutes; patient encouraged to attend groups; patient educated about medications; patient given medications per physician orders; patient encouraged to express feelings and/or concerns  R: Patient is bright and animated; patient is cooperative and pleasant; patient's interaction with staff and peers is appropriate; patient is taking medications as prescribed and tolerating medications; patient is attending all groups and is engaging and asking about when she will be discharged

## 2013-02-27 NOTE — Progress Notes (Addendum)
D: PAtient in the dayroom interacting with peers on approach.  Patient states she is ready to go home tomorrow but states she has been having headaches time 3 weeks.  Patient states the Naproxen she is on for headaches is not working.  Patient was given one dose of Toradol IM for a headache.  Patient bright and interacting and visible on the unit.  Patient denies SI/HI and denies AVH. A: Staff to monitor Q 15 mins for safety.  Encouragement and support offered.  Scheduled medications administered per orders.  Vistaril administered prn for sleep R: Patient remains safe on the unit.  Patient attended group tonight.  Patient calm, cooperative and taking administered medications.  Patient visible on the unit and interacting with peers.

## 2013-02-28 DIAGNOSIS — F332 Major depressive disorder, recurrent severe without psychotic features: Principal | ICD-10-CM

## 2013-02-28 MED ORDER — SUMATRIPTAN SUCCINATE 50 MG PO TABS
50.0000 mg | ORAL_TABLET | ORAL | Status: DC | PRN
  Administered 2013-02-28: 50 mg via ORAL
  Filled 2013-02-28: qty 1

## 2013-02-28 MED ORDER — ONDANSETRON HCL 4 MG PO TABS
4.0000 mg | ORAL_TABLET | Freq: Once | ORAL | Status: AC
  Administered 2013-02-28: 4 mg via ORAL
  Filled 2013-02-28 (×2): qty 1

## 2013-02-28 MED ORDER — PANTOPRAZOLE SODIUM 40 MG PO TBEC
40.0000 mg | DELAYED_RELEASE_TABLET | Freq: Every day | ORAL | Status: DC
  Administered 2013-02-28 – 2013-03-01 (×2): 40 mg via ORAL
  Filled 2013-02-28 (×5): qty 1

## 2013-02-28 NOTE — Progress Notes (Signed)
Patient ID: Angela Mosley, female   DOB: [birth year], 27 y.o.   MRN: [id number] [facility] MD Progress Note  02/28/2013 10:52 AM Surina Storts  MRN:  [id number]  Subjective:  Im doing good. Patient reports a significant headache that she has had for weeks at home with features of both migraine and tension headache. She has only used Tylenol at home, states Advil and Motrin do not work for her, and she received a Tramadol shot last night. Some relief with the Tramadol but woke up with it this AM. Also requesting Protonix.  Diagnosis:  Axis I: Major Depression, Recurrent severe  ADL's:  Intact  Sleep: Fair  Appetite:  Fair  Suicidal Ideation:  Patient has made suicide attempt by overdose on medications and continued to feel unsafe Homicidal Ideation:  Denied AEB (as evidenced by):  Psychiatric Specialty Exam: Review of Systems  Constitutional: Negative.  Negative for fever, chills, weight loss, malaise/fatigue and diaphoresis.  HENT: Negative for congestion and sore throat.   Eyes: Negative for blurred vision, double vision and photophobia.  Respiratory: Negative for cough, shortness of breath and wheezing.   Cardiovascular: Negative for chest pain, palpitations and PND.  Gastrointestinal: Negative for heartburn, nausea, vomiting, abdominal pain, diarrhea and constipation.  Musculoskeletal: Negative for myalgias, joint pain and falls.  Neurological: Negative for dizziness, tingling, tremors, sensory change, speech change, focal weakness, seizures, loss of consciousness, weakness and headaches.  Endo/Heme/Allergies: Negative for polydipsia. Does not bruise/bleed easily.  Psychiatric/Behavioral: Negative for depression, suicidal ideas, hallucinations, memory loss and substance abuse. The patient is not nervous/anxious and does not have insomnia.     Blood pressure 111/77, pulse 81, temperature 98.5 F (36.9 C), temperature source Oral, resp. rate 20, height 5\' 4"  (1.626 m), weight  79.379 kg (175 lb), last menstrual period 02/19/2013.Body mass index is 30.02 kg/(m^2).  General Appearance: Guarded  Eye Contact::  Minimal  Speech:  Clear and Coherent  Volume:  Decreased  Mood:  Anxious and Depressed  Affect:  Congruent, Depressed and Flat  Thought Process:  Goal Directed and Intact  Orientation:  Full (Time, Place, and Person)  Thought Content:  Rumination  Suicidal Thoughts:  Yes.  with intent/plan  Homicidal Thoughts:  No  Memory:  Immediate;   Fair  Judgement:  Impaired  Insight:  Lacking  Psychomotor Activity:  Psychomotor Retardation  Concentration:  Fair  Recall:  Fair  Akathisia:  NA  Handed:  Right  AIMS (if indicated):     Assets:  Communication Skills Desire for Improvement Physical Health Resilience Social Support  Sleep:  Number of Hours: 2   Current Medications: Current Facility-Administered Medications  Medication Dose Route Frequency Provider Last Rate Last Dose  . acetaminophen (TYLENOL) tablet 650 mg  650 mg Oral Q6H PRN Earney Navy, NP   650 mg at 02/27/13 1302  . alum & mag hydroxide-simeth (MAALOX/MYLANTA) 200-200-20 MG/5ML suspension 30 mL  30 mL Oral Q4H PRN Earney Navy, NP      . FLUoxetine (PROZAC) capsule 20 mg  20 mg Oral Daily Nehemiah Settle, MD   20 mg at 02/28/13 0736  . hydrOXYzine (ATARAX/VISTARIL) tablet 50 mg  50 mg Oral QHS PRN Earney Navy, NP   50 mg at 02/27/13 2151  . magnesium hydroxide (MILK OF MAGNESIA) suspension 30 mL  30 mL Oral Daily PRN Earney Navy, NP      . multivitamin with minerals tablet 1 tablet  1 tablet Oral Daily Jeoffrey Massed, RD  1 tablet at 02/28/13 0736  . naproxen (NAPROSYN) tablet 500 mg  500 mg Oral TID WC Nehemiah Settle, MD   500 mg at 02/28/13 1610    Lab Results:  No results found for this or any previous visit (from the past 48 hour(s)).  Physical Findings: AIMS: Facial and Oral Movements Muscles of Facial Expression: None, normal Lips  and Perioral Area: None, normal Jaw: None, normal Tongue: None, normal,Extremity Movements Upper (arms, wrists, hands, fingers): None, normal Lower (legs, knees, ankles, toes): None, normal, Trunk Movements Neck, shoulders, hips: None, normal, Overall Severity Severity of abnormal movements (highest score from questions above): None, normal Incapacitation due to abnormal movements: None, normal Patient's awareness of abnormal movements (rate only patient's report): No Awareness, Dental Status Current problems with teeth and/or dentures?: No Does patient usually wear dentures?: No  CIWA:  CIWA-Ar Total: 0 COWS:  COWS Total Score: 0  Treatment Plan Summary: Daily contact with patient to assess and evaluate symptoms and progress in treatment Medication management  Plan: Continue fluoxetine 20 mg daily I1. Continue for crisis management and stabilization. 2. Medication management to reduce current symptoms to base line and improve the patient's overall level of functioning. 3. Treat health problems as indicated. 4. Develop treatment plan to decrease risk of relapse upon discharge and to reduce the need for readmission. 5. Psycho-social education regarding relapse prevention and self care. 6. Health care follow up as needed for medical problems. 7. Restart home medications where appropriate. 8. Start Imitrex for headache. 9. Stop NSAID due to nausea and rebound. 10.Add Zofran for nausea. 11. Add Protonix for reflux. Due to patient's significant headache with visual changes, nausea, will treat and observe for 24 hours if no relief with new changes will change SSRI.  12. ELOS: d/c Wednesday.  Medical Decision Making Problem Points:  Established problem, worsening (2), Review of last therapy session (1) and Review of psycho-social stressors (1) Data Points:  Review or order clinical lab tests (1) Review or order medicine tests (1) Review of medication regiment & side effects  (2) Review of new medications or change in dosage (2)  I certify that inpatient services furnished can reasonably be expected to improve the patient's condition.  Rona Ravens. Mashburn RPAC 11:11 AM 02/28/2013  Reviewed the information documented and agree with the treatment plan.  Elleana Stillson,JANARDHAHA R. 02/28/2013 12:55 PM

## 2013-02-28 NOTE — Progress Notes (Signed)
Recreation Therapy Notes  Date: 08.05.2014 Time: 2:45pm Location: [address] Day Room  Group Topic: Animal Assisted Activities  Goal Area(s) Addresses:  Patient will interact appropriately with dog team.    Behavioral Response: Did not attend.    Isai Gottlieb L Delise Simenson, LRT/CTRS  Dianca Owensby L 02/28/2013 4:17 PM 

## 2013-02-28 NOTE — Progress Notes (Signed)
Recreation Therapy Notes   Date: 08.04.2014 Time: 2:15pm Location: [address] Dayroom  Group Topic: Problem Solving  Goal Area(s) Addresses:  Patient will effectively work in a team with other group members. Patient will verbalize skills needed to make activity successful.  Patient will verbalize ways to use skills used in group session post d/c.  Behavioral Response: Engaged, Appropriate, Sharing  Intervention: Problem Solving Scenario  Activity: Life Boat. Patients were given the following scenario: We have chartered a Radio producer for the afternoon, half way through our trip the [employer] a leak. The following individuals were on the yacht with [place]: Materials engineer, [address], Janyth Pupa, Teacher, Personnel officer, Curator, Chef, Pregnant Woman, Ex-Convict, Ex- Marine, [place], [place], [place], [place], Midwife.   Education: Customer service manager, Discharge Planning, Relapse Prevention  Education Outcome: Acknowledges understanding  Clinical Observations/Feedback: Patient actively participated in group activity. Patient voiced opinion in an appropriate way and debated with peers as needed. Patient contributed to wrap up discussion, verbalizing understanding of skills used - communication, decision making, and skills of individuals on life raft - to make activity successful. Patient actively listened to other points raised by group members, as she was observed to make eye contact with individual speaking and nod in agreement with points of interest.   Jearl Klinefelter, LRT/CTRS  Julieta Rogalski L 02/28/2013 9:20 AM

## 2013-02-28 NOTE — Progress Notes (Signed)
D: PAtient in the hallway on approach.  Patient states she thought she would be discharged today but states three of her medications changed so she had to stay an extra day.  Patient states she can be argumentative at times but states she has been working on it.  Patient states she also is learning to not let people upset her and get her upset. Patient denies SI/HI and denies AVH. A: Staff to monitor Q 15 mins for safety.  Encouragement and support offered.  No scheduled medications administered per orders.  Vistaril administered prn for sleep. R: Patient remains safe on the unit.  Patient attended group tonight.  Patient taking administered medications.  Patient visible on the unit and interacting with peers.

## 2013-02-28 NOTE — Progress Notes (Signed)
Adult Psychoeducational Group Note  Date:  02/28/2013 Time:  9:56 PM  Group Topic/Focus:  Wrap-Up Group:   The focus of this group is to help patients review their daily goal of treatment and discuss progress on daily workbooks.  Participation Level:  Minimal  Participation Quality:  Inattentive and Resistant  Affect:  Defensive  Cognitive:  Alert  Insight: Limited  Engagement in Group:  Defensive and Resistant  Modes of Intervention:  Support  Additional Comments:  Pt stated that "she had a bad day and that it did not get any better". She refused to elaborate.   Humberto Seals Monique 02/28/2013, 9:56 PM

## 2013-02-28 NOTE — [facility] Group Notes (Signed)
[facility] LCSW Group Therapy  02/28/2013  1:15 PM   Type of Therapy:  Group Therapy  Participation Level:  Active  Participation Quality:  Appropriate and Attentive  Affect:  Appropriate, Depressed and Flat  Cognitive:  Alert and Appropriate  Insight:  Developing/Improving and Engaged  Engagement in Therapy:  Developing/Improving and Engaged  Modes of Intervention:  Clarification, Confrontation, Discussion, Education, Exploration, Limit-setting, Orientation, Problem-solving, Rapport Building, Dance movement psychotherapist, Socialization and Support  Summary of Progress/Problems: The topic for group therapy was feelings about diagnosis.  Pt actively participated in group discussion on their past and current diagnosis and how they feel towards this.  Pt also identified how society and family members judge them, based on their diagnosis as well as stereotypes and stigmas.   Pt shared that she has accepted that she has depression and anxiety and is ready to get help for it.  Pt was able to process issues she has with her 5 year old daughter, and concerns that she too has behavioral issues.  Discussed pt getting help in the home for her as well as her daughter.  Pt actively participated and was engaged in group discussion.    Reyes Ivan, LCSWA 02/28/2013 2:56 PM

## 2013-02-28 NOTE — Progress Notes (Signed)
Adult Psychoeducational Group Note  Date:  02/28/2013 Time:  3:43 PM  Group Topic/Focus:  Recovery Goals:   The focus of this group is to identify appropriate goals for recovery and establish a plan to achieve them.  Participation Level:  None  Participation Quality:  Inattentive  Affect:  Blunted  Cognitive:  Lacking  Insight: None  Engagement in Group:  None  Modes of Intervention:  Discussion, Education, Limit-setting, Socialization and Support  Additional Comments:  Angela Mosley attended group and did not share during group time. Patient was asked to define recovery and give an example of a situation where patient loss control and could no longer handle situation. Patient also did not want to share two changes she would like to change for recovery and goals with the changes.  Angela Mosley 02/28/2013, 3:43 PM

## 2013-02-28 NOTE — Progress Notes (Signed)
D: Patient denies SI/HI and A/V hallucinations; patient reports sleep is fair; reports appetite is good; reports energy level is normal ; reports ability to pay attention is good; rates depression as 1/10; rates hopelessness 1/10;   A: Monitored q 15 minutes; patient encouraged to attend groups; patient educated about medications; patient given medications per physician orders; patient encouraged to express feelings and/or concerns  R: Patient has some complaints of nausea; patient is appropriate and cooperative ; patient's interaction with staff and peers is appropriate; patient is taking medications as prescribed and tolerating medications; patient is attending all groups and engaging and has some insight

## 2013-02-28 NOTE — Progress Notes (Signed)
Patient attended 0900 hr RN orientation group.The focus of this group is to educate the patient on the purpose and policies of crisis stabilization and provide a format to answer questions about their admission.  The group details unit policies and expectations of patients while admitted. Patient actively participated and patient was engaging and appropriate in affect 

## 2013-03-01 MED ORDER — HYDROXYZINE HCL 50 MG PO TABS: 50.0000 mg | ORAL_TABLET | Freq: Every evening | ORAL | Status: DC | PRN

## 2013-03-01 MED ORDER — FLUOXETINE HCL 20 MG PO CAPS: 20.0000 mg | ORAL_CAPSULE | Freq: Every day | ORAL | Status: DC

## 2013-03-01 NOTE — [facility] Suicide Risk Assessment (Signed)
[facility] INPATIENT:  Family/Significant Other Suicide Prevention Education  Suicide Prevention Education:  Education Completed; Cloria Spring - mother [phone]),  (name of family member/significant other) has been identified by the patient as the family member/significant other with whom the patient will be residing, and identified as the person(s) who will aid the patient in the event of a mental health crisis (suicidal ideations/suicide attempt).  With written consent from the patient, the family member/significant other has been provided the following suicide prevention education, prior to the and/or following the discharge of the patient.  The suicide prevention education provided includes the following:  Suicide risk factors  Suicide prevention and interventions  National Suicide Hotline telephone number  [facility] assessment telephone number  [facility] Emergency Assistance 911  [facility] and/or Residential Mobile Crisis Unit telephone number  Request made of family/significant other to:  Remove weapons (e.g., guns, rifles, knives), all items previously/currently identified as safety concern.    Remove drugs/medications (over-the-counter, prescriptions, illicit drugs), all items previously/currently identified as a safety concern.  The family member/significant other verbalizes understanding of the suicide prevention education information provided.  The family member/significant other agrees to remove the items of safety concern listed above.  Carmina Miller 03/01/2013, 10:37 AM

## 2013-03-01 NOTE — Progress Notes (Signed)
Patient ID: Angela Mosley, female   DOB: May 23, 1986, 27 y.o.   MRN: [id number] Patient discharged per physician order; patient denies SI/HI and A/V hallucinations; patient received samples, prescriptions,and copy of AVS; patient had no other questions or concerns at this time;patient verbalized and signed that she received all belongings; patient left the unit ambulatory with her brother

## 2013-03-01 NOTE — Progress Notes (Signed)
Adult Psychoeducational Group Note  Date:  03/01/2013 Time:  6:54 PM  Group Topic/Focus:  Personal Choices and Values:   The focus of this group is to help patients assess and explore the importance of values in their lives, how their values affect their decisions, how they express their values and what opposes their expression.  Participation Level:  Minimal  Participation Quality:  Appropriate and Attentive  Affect:  Appropriate  Cognitive:  Appropriate  Insight: Appropriate and Good  Engagement in Group:  Developing/Improving  Modes of Intervention:  Discussion, Education and Role-play  Additional Comments:  Simona attended and participated at times  during group. Patient stated negative and positive values and choices that patient have been made throughout life span. Patient completed identifying values and value oriented life worksheets during group shared how to improve values and explained values that are important to patient.    Karleen Hampshire Brittini 03/01/2013, 6:54 PM

## 2013-03-01 NOTE — Progress Notes (Signed)
[facility] Adult Case Management Discharge Plan :  Will you be returning to the same living situation after discharge: Yes,  returning home At discharge, do you have transportation home?:Yes,  access to transportation, provided pt with a bus pass Do you have the ability to pay for your medications:Yes,  access to meds  Release of information consent forms completed and in the chart;  Patient's signature needed at discharge.  Patient to Follow up at: Follow-up Information   Follow up with [facility] of the [place] On 03/02/2013. (Walk in on this date for hospital discharge appointment.  Walk in clinic is Monday - Friday 8 am - 3pm.  Schedule medicaiton management and therapy.)    Contact information:   [address] [place] [address] Phone: [phone] Fax: [phone]       Patient denies SI/HI:   Yes,  denies SI/HI    Safety Planning and Suicide Prevention discussed:  Yes,  discussed with pt and pt's mother.  See suicide prevention education note.   Carmina Miller 03/01/2013, 10:49 AM

## 2013-03-01 NOTE — [facility] Suicide Risk Assessment (Signed)
Suicide Risk Assessment  Discharge Assessment     Demographic Factors:  Adolescent or young adult, Low socioeconomic status and Unemployed  Mental Status Per Nursing Assessment::   On Admission:  NA (pt denies)  Current Mental Status by Physician: Mental Status Examination: Patient appeared as per his stated age, casually dressed, and fairly groomed, and maintaining good eye contact. Patient has good mood and his affect was constricted. He has normal rate, rhythm, and volume of speech. His thought process is linear and goal directed. Patient has denied suicidal, homicidal ideations, intentions or plans. Patient has no evidence of auditory or visual hallucinations, delusions, and paranoia. Patient has fair insight judgment and impulse control.  Loss Factors: Decrease in vocational status and Financial problems/change in socioeconomic status  Historical Factors: Prior suicide attempts and Family history of mental illness or substance abuse  Risk Reduction Factors:   Sense of responsibility to family, Religious beliefs about death, Living with another person, especially a relative, Positive social support, Positive therapeutic relationship and Positive coping skills or problem solving skills  Continued Clinical Symptoms:  Severe Anxiety and/or Agitation Depression:   Anhedonia Recent sense of peace/wellbeing  Cognitive Features That Contribute To Risk:  Closed-mindedness    Suicide Risk:  Minimal: No identifiable suicidal ideation.  Patients presenting with no risk factors but with morbid ruminations; may be classified as minimal risk based on the severity of the depressive symptoms  Discharge Diagnoses:   AXIS I:  Major Depression, Recurrent severe and Post Traumatic Stress Disorder AXIS II:  Deferred AXIS III:   Past Medical History  Diagnosis Date  . Hyperemesis gravidarum   . Anxiety   . PTSD (post-traumatic stress disorder)   . Depression    AXIS IV:  economic problems,  occupational problems, other psychosocial or environmental problems, problems related to social environment and problems with primary support group AXIS V:  51-60 moderate symptoms  Plan Of Care/Follow-up recommendations:  Activity:  As tolerated Diet:  Regular  Is patient on multiple antipsychotic therapies at discharge:  No   Has Patient had three or more failed trials of antipsychotic monotherapy by history:  No  Recommended Plan for Multiple Antipsychotic Therapies: Not applicable  Nehemiah Settle., M.D. 03/01/2013, 12:10 PM

## 2013-03-01 NOTE — Tx Team (Signed)
Interdisciplinary Treatment Plan Update (Adult)  Date: 03/01/2013  Time Reviewed:  9:45 AM  Progress in Treatment: Attending groups: Yes Participating in groups:  Yes Taking medication as prescribed:  Yes Tolerating medication:  Yes Family/Significant othe contact made: CSW assessing  Patient understands diagnosis:  Yes Discussing patient identified problems/goals with staff:  Yes Medical problems stabilized or resolved:  Yes Denies suicidal/homicidal ideation: Yes Issues/concerns per patient self-inventory:  Yes Other:  New problem(s) identified: N/A  Discharge Plan or Barriers: Pt will follow up at [facility] of the [place] for medication management and therapy.    Reason for Continuation of Hospitalization: Stable to d/c  Comments: N/A  Estimated length of stay: D/C today  For review of initial/current patient goals, please see plan of care.  Attendees: Patient:  Angela Mosley  03/01/2013 10:11 AM   Family:     Physician:  Dr. Javier Glazier 03/01/2013 10:04 AM   Nursing:   Burnetta Sabin, RN 03/01/2013 10:04 AM   Clinical Social Worker:  Reyes Ivan, LCSWA 03/01/2013 10:04 AM   Other: Verne Spurr, PA 03/01/2013 10:04 AM   Other:  Lowella Grip, RN 03/01/2013 10:04 AM   Other:  Juline Patch, LCSW 03/01/2013 10:04 AM   Other:     Other:    Other:    Other:    Other:    Other:    Other:     Scribe for Treatment Team:   Carmina Miller, 03/01/2013 10:04 AM

## 2013-03-01 NOTE — [facility] Group Notes (Signed)
[facility] LCSW Aftercare Discharge Planning Group Note   03/01/2013 8:45 AM  Participation Quality:  Alert and Appropriate   Mood/Affect:  Appropriate and Bright  Depression Rating:  1  Anxiety Rating:  1  Thoughts of Suicide:  Pt denies SI/HI  Will you contract for safety?   Yes  Current AVH: Pt denies  Plan for Discharge/Comments:  Pt attended discharge planning group and actively participated in group.  CSW provided pt with today's workbook.  Pt reports feeling stable to d/c today.  Pt states that she will follow up at [facility] of the [place] for medication management and therapy.  Pt will return home in [place].  No further needs voiced by pt at this time.    Transportation Means: Pt reports access to transportation  Supports: Pt states family is supportive  Angela Mosley, Theresia Majors 03/01/2013 9:36 AM

## 2013-03-01 NOTE — [facility] Group Notes (Deleted)
[facility] LCSW Aftercare Discharge Planning Group Note   03/01/2013 10:00 AM  Participation Quality:  Appropriate  Mood/Affect:  Appropriate  Depression Rating:  1  Anxiety Rating:  1  Thoughts of Suicide:  No  Will you contract for safety?   NA  Current AVH:  No  Plan for Discharge/Comments:  Patient attending discharge planning group and actively participated in group.  He advised of not being sure of the reason for his admission. Patient to be assessed individually for discharge needs. CSW provided all participants with daily workbook.   Transportation Means: Unknown  Supports:  Patient has limited support system.   Thanh Pomerleau, Joesph July

## 2013-03-01 NOTE — Discharge Summary (Signed)
Physician Discharge Summary Note  Patient:  Angela Mosley is an 27 y.o., female MRN:  [id number] DOB:  1986-06-09 Patient phone:  [phone] (home)  Patient address:   [address] [place] [address],   Date of Admission:  02/24/2013 Date of Discharge:03/01/2013  Reason for Admission:  Suicide attempt  Discharge Diagnoses: Principal Problem:   MDD (major depressive disorder), recurrent severe, without psychosis Active Problems:   PTSD (post-traumatic stress disorder)   Cannabis abuse  Review of Systems  Constitutional: Negative.  Negative for fever, chills, weight loss, malaise/fatigue and diaphoresis.  HENT: Negative for congestion and sore throat.   Eyes: Negative for blurred vision, double vision and photophobia.  Respiratory: Negative for cough, shortness of breath and wheezing.   Cardiovascular: Negative for chest pain, palpitations and PND.  Gastrointestinal: Negative for heartburn, nausea, vomiting, abdominal pain, diarrhea and constipation.  Musculoskeletal: Negative for myalgias, joint pain and falls.  Neurological: Negative for dizziness, tingling, tremors, sensory change, speech change, focal weakness, seizures, loss of consciousness, weakness and headaches.  Endo/Heme/Allergies: Negative for polydipsia. Does not bruise/bleed easily.  Psychiatric/Behavioral: Negative for depression, suicidal ideas, hallucinations, memory loss and substance abuse. The patient is not nervous/anxious and does not have insomnia.   Assessment:  AXIS I: Major Depression, Recurrent severe, Post Traumatic Stress Disorder and Cannabis abuse  AXIS II: Deferred  AXIS III:  Past Medical History   Diagnosis  Date   .  Hyperemesis gravidarum    .  Anxiety    .  PTSD (post-traumatic stress disorder)    .  Depression     AXIS IV: economic problems, occupational problems, other psychosocial or environmental problems and problems related to social environment  AXIS V: 41-50 serious symptoms   Level of Care:  OP  Hospital Course:        Angela Mosley was admitted after being brought to the ED by police after a suicide attempt via overdose of ES Tylenol and Dimetapp cough syrup.  She was stabilized and accepted to treatment at [facility].        Angela Mosley was admitted to the unit and evaluated. The symptoms were identified as increasing deperession, anehodnia, crying spells, isolation, poor appetite, poor sleep, feelings of hopelessness and helplessness,extreme anxiety with panic, suicidal ideations, suicide attempt, irritability, easily frustrated, agitation and a 60# weight loss in one year.        She was encouraged to participate in unit programming and medication management was initiated.        Angela Mosley was evaluated each day by a clinical provider to ascertain the patient's response to treatment.  Improvement was noted by the patient's report of decreasing symptoms, improved sleep and appetite, affect, medication tolerance, behavior, and participation in unit programming.  The patient was asked each day to complete a self inventory noting mood, mental status, pain, new symptoms, anxiety and concerns.        The patient responded well to medication and being in a therapeutic and supportive environment. Positive and appropriate behavior was noted and the patient was motivated for recovery. The patient worked closely with the treatment team and case manager to develop a discharge plan with appropriate goals. Coping skills, problem solving as well as relaxation therapies were also part of the unit programming.         By the day of discharge the patient was in much improved condition than upon admission.  Symptoms were reported as significantly decreased or resolved completely.  The patient denied SI/HI and voiced no  AVH. He/she was motivated to continue taking medication with a goal of continued improvement in mental health.  She was discharged home with a plan to follow up as noted below.  Consults:   None  Significant Diagnostic Studies:  None  Discharge Vitals:   Blood pressure 127/68, pulse 91, temperature 97.5 F (36.4 C), temperature source Oral, resp. rate 16, height 5\' 4"  (1.626 m), weight 79.379 kg (175 lb), last menstrual period 02/19/2013. Body mass index is 30.02 kg/(m^2). Lab Results:   No results found for this or any previous visit (from the past 72 hour(s)).  Physical Findings: AIMS: Facial and Oral Movements Muscles of Facial Expression: None, normal Lips and Perioral Area: None, normal Jaw: None, normal Tongue: None, normal,Extremity Movements Upper (arms, wrists, hands, fingers): None, normal Lower (legs, knees, ankles, toes): None, normal, Trunk Movements Neck, shoulders, hips: None, normal, Overall Severity Severity of abnormal movements (highest score from questions above): None, normal Incapacitation due to abnormal movements: None, normal Patient's awareness of abnormal movements (rate only patient's report): No Awareness, Dental Status Current problems with teeth and/or dentures?: No Does patient usually wear dentures?: No  CIWA:  CIWA-Ar Total: 0 COWS:  COWS Total Score: 0  Psychiatric Specialty Exam: See Psychiatric Specialty Exam and Suicide Risk Assessment completed by Attending Physician prior to discharge.  Discharge destination:  Home  Is patient on multiple antipsychotic therapies at discharge:  No   Has Patient had three or more failed trials of antipsychotic monotherapy by history:  No  Recommended Plan for Multiple Antipsychotic Therapies: NA   Discharge Orders   Future Orders Complete By Expires     Diet - low sodium heart healthy  As directed     Discharge instructions  As directed     Comments:      Take all of your medications as directed. Be sure to keep all of your follow up appointments.  If you are unable to keep your follow up appointment, call your Doctor's office to let them know, and reschedule.  Make sure that you have  enough medication to last until your appointment. Be sure to get plenty of rest. Going to bed at the same time each night will help. Try to avoid sleeping during the day.  Increase your activity as tolerated. Regular exercise will help you to sleep better and improve your mental health. Eating a heart healthy diet is recommended. Try to avoid salty or fried foods. Be sure to avoid all alcohol and illegal drugs.    Increase activity slowly  As directed         Medication List       Indication   FLUoxetine 20 MG capsule  Commonly known as:  PROZAC  Take 1 capsule (20 mg total) by mouth daily.   Indication:  Depression     hydrOXYzine 50 MG tablet  Commonly known as:  ATARAX/VISTARIL  Take 1 tablet (50 mg total) by mouth at bedtime as needed (sleep).            Follow-up Information   Follow up with [facility] of the [place] On 03/02/2013. (Walk in on this date for hospital discharge appointment.  Walk in clinic is Monday - Friday 8 am - 3pm.  Schedule medicaiton management and therapy.)    Contact information:   [address] [place] [address] Phone: [phone] Fax: [phone]       Follow-up recommendations:   Activities: Resume activity as tolerated. Diet: Heart healthy low sodium  diet Tests: Follow up testing will be determined by your out patient provider.  Comments:    Total Discharge Time:  Greater than 30 minutes.  Signed: Rona Ravens. [place] RPAC 10:21 AM 03/01/2013   Patient is evaluated and the discharge plan was formulated. Case was discussed with the treatment team and a physician extender.Reviewed the information documented and agree with the treatment plan.  Nehemiah Settle., M.D. 03/01/2013 3:13 PM

## 2013-03-06 NOTE — Progress Notes (Signed)
Patient Discharge Instructions:  After Visit Summary (AVS):   Faxed to:  03/06/13 Discharge Summary Note:   Faxed to:  03/06/13 Psychiatric Admission Assessment Note:   Faxed to:  03/06/13 Suicide Risk Assessment - Discharge Assessment:   Faxed to:  03/06/13 Faxed/Sent to the Next Level Care provider:  03/06/13 Faxed to [facility] @ [phone]  Jerelene Redden, 03/06/2013, 3:10 PM

## 2013-05-10 ENCOUNTER — Emergency Department (HOSPITAL_COMMUNITY): Payer: Medicaid Other

## 2013-05-10 ENCOUNTER — Encounter (HOSPITAL_COMMUNITY): Payer: Self-pay | Admitting: Emergency Medicine

## 2013-05-10 ENCOUNTER — Emergency Department (HOSPITAL_COMMUNITY)
Admission: EM | Admit: 2013-05-10 | Discharge: 2013-05-10 | Disposition: A | Discharge status: Home / Self Care | Payer: Medicaid Other | Attending: Emergency Medicine | Admitting: Emergency Medicine

## 2013-05-10 DIAGNOSIS — F4310 Post-traumatic stress disorder, unspecified: Secondary | ICD-10-CM | POA: Insufficient documentation

## 2013-05-10 DIAGNOSIS — Z79899 Other long term (current) drug therapy: Secondary | ICD-10-CM | POA: Insufficient documentation

## 2013-05-10 DIAGNOSIS — S46909A Unspecified injury of unspecified muscle, fascia and tendon at shoulder and upper arm level, unspecified arm, initial encounter: Secondary | ICD-10-CM | POA: Insufficient documentation

## 2013-05-10 DIAGNOSIS — Z91040 Latex allergy status: Secondary | ICD-10-CM | POA: Insufficient documentation

## 2013-05-10 DIAGNOSIS — Y9389 Activity, other specified: Secondary | ICD-10-CM | POA: Insufficient documentation

## 2013-05-10 DIAGNOSIS — Y92410 Unspecified street and highway as the place of occurrence of the external cause: Secondary | ICD-10-CM | POA: Insufficient documentation

## 2013-05-10 DIAGNOSIS — F17200 Nicotine dependence, unspecified, uncomplicated: Secondary | ICD-10-CM | POA: Insufficient documentation

## 2013-05-10 DIAGNOSIS — F3289 Other specified depressive episodes: Secondary | ICD-10-CM | POA: Insufficient documentation

## 2013-05-10 DIAGNOSIS — T07XXXA Unspecified multiple injuries, initial encounter: Secondary | ICD-10-CM | POA: Insufficient documentation

## 2013-05-10 DIAGNOSIS — IMO0002 Reserved for concepts with insufficient information to code with codable children: Secondary | ICD-10-CM | POA: Insufficient documentation

## 2013-05-10 DIAGNOSIS — F411 Generalized anxiety disorder: Secondary | ICD-10-CM | POA: Insufficient documentation

## 2013-05-10 DIAGNOSIS — S298XXA Other specified injuries of thorax, initial encounter: Secondary | ICD-10-CM | POA: Insufficient documentation

## 2013-05-10 DIAGNOSIS — F329 Major depressive disorder, single episode, unspecified: Secondary | ICD-10-CM | POA: Insufficient documentation

## 2013-05-10 DIAGNOSIS — S8990XA Unspecified injury of unspecified lower leg, initial encounter: Secondary | ICD-10-CM | POA: Insufficient documentation

## 2013-05-10 DIAGNOSIS — S4980XA Other specified injuries of shoulder and upper arm, unspecified arm, initial encounter: Secondary | ICD-10-CM | POA: Insufficient documentation

## 2013-05-10 MED ORDER — OXYCODONE-ACETAMINOPHEN 5-325 MG PO TABS
1.0000 | ORAL_TABLET | Freq: Once | ORAL | Status: AC
  Administered 2013-05-10: 1 via ORAL
  Filled 2013-05-10: qty 1

## 2013-05-10 MED ORDER — OXYCODONE-ACETAMINOPHEN 5-325 MG PO TABS: 2.0000 | ORAL_TABLET | ORAL | Status: DC | PRN

## 2013-05-10 MED ORDER — NAPROXEN 500 MG PO TABS: 500.0000 mg | ORAL_TABLET | Freq: Two times a day (BID) | ORAL | Status: DC

## 2013-05-10 MED ORDER — METHOCARBAMOL 500 MG PO TABS: 500.0000 mg | ORAL_TABLET | Freq: Three times a day (TID) | ORAL | Status: DC | PRN

## 2013-05-10 MED ORDER — ONDANSETRON 4 MG PO TBDP
4.0000 mg | ORAL_TABLET | Freq: Once | ORAL | Status: AC
  Administered 2013-05-10: 4 mg via ORAL
  Filled 2013-05-10: qty 1
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
## 2013-05-10 NOTE — ED Notes (Signed)
Patient is alert and orientedx4.  Patient was explained discharge instructions and they understood them with no questions.  The patient's friend, Madelon Lips is transporting the patient home.

## 2013-05-10 NOTE — ED Notes (Signed)
Patient transported to [facility] D-[id number] from CT.
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
## 2013-08-10 ENCOUNTER — Inpatient Hospital Stay (HOSPITAL_COMMUNITY)
Admission: AD | Admit: 2013-08-10 | Discharge: 2013-08-10 | Disposition: A | Discharge status: Home / Self Care | Payer: Self-pay | Source: Ambulatory Visit | Attending: Obstetrics & Gynecology | Admitting: Obstetrics & Gynecology

## 2013-08-10 ENCOUNTER — Inpatient Hospital Stay (HOSPITAL_COMMUNITY): Payer: Medicaid Other

## 2013-08-10 ENCOUNTER — Encounter (HOSPITAL_COMMUNITY): Payer: Self-pay | Admitting: General Practice

## 2013-08-10 DIAGNOSIS — N946 Dysmenorrhea, unspecified: Secondary | ICD-10-CM | POA: Insufficient documentation

## 2013-08-10 DIAGNOSIS — N8300 Follicular cyst of ovary, unspecified side: Secondary | ICD-10-CM | POA: Insufficient documentation

## 2013-08-10 DIAGNOSIS — F17200 Nicotine dependence, unspecified, uncomplicated: Secondary | ICD-10-CM | POA: Insufficient documentation

## 2013-08-10 DIAGNOSIS — N921 Excessive and frequent menstruation with irregular cycle: Secondary | ICD-10-CM

## 2013-08-10 DIAGNOSIS — IMO0002 Reserved for concepts with insufficient information to code with codable children: Secondary | ICD-10-CM | POA: Insufficient documentation

## 2013-08-10 DIAGNOSIS — N920 Excessive and frequent menstruation with regular cycle: Secondary | ICD-10-CM | POA: Insufficient documentation

## 2013-08-10 DIAGNOSIS — R300 Dysuria: Secondary | ICD-10-CM | POA: Insufficient documentation

## 2013-08-10 LAB — CBC
HCT: 38 % (ref 36.0–46.0)
Hemoglobin: 12.5 g/dL (ref 12.0–15.0)
MCH: 30.9 pg (ref 26.0–34.0)
MCHC: 32.9 g/dL (ref 30.0–36.0)
MCV: 94.1 fL (ref 78.0–100.0)
Platelets: 276 10*3/uL (ref 150–400)
RBC: 4.04 MIL/uL (ref 3.87–5.11)
RDW: 13.1 % (ref 11.5–15.5)
WBC: 8.3 10*3/uL (ref 4.0–10.5)

## 2013-08-10 LAB — COMPREHENSIVE METABOLIC PANEL
ALBUMIN: 4.1 g/dL (ref 3.5–5.2)
ALK PHOS: 62 U/L (ref 39–117)
ALT: 9 U/L (ref 0–35)
AST: 15 U/L (ref 0–37)
BUN: 11 mg/dL (ref 6–23)
CALCIUM: 9.2 mg/dL (ref 8.4–10.5)
CO2: 25 mEq/L (ref 19–32)
Chloride: 104 mEq/L (ref 96–112)
Creatinine, Ser: 0.65 mg/dL (ref 0.50–1.10)
GFR calc Af Amer: >90 mL/min (ref >90)
GFR calc non Af Amer: >90 mL/min (ref >90)
Glucose, Bld: 92 mg/dL (ref 70–99)
POTASSIUM: 3.8 meq/L (ref 3.7–5.3)
SODIUM: 139 meq/L (ref 137–147)
TOTAL PROTEIN: 7.1 g/dL (ref 6.0–8.3)
Total Bilirubin: <0.2 mg/dL — ABNORMAL LOW (ref 0.3–1.2)

## 2013-08-10 LAB — WET PREP, GENITAL
Clue Cells Wet Prep HPF POC: NONE SEEN
Trich, Wet Prep: NONE SEEN
WBC WET PREP: NONE SEEN
YEAST WET PREP: NONE SEEN

## 2013-08-10 LAB — URINALYSIS, ROUTINE W REFLEX MICROSCOPIC
Bilirubin Urine: NEGATIVE
Glucose, UA: NEGATIVE mg/dL
Ketones, ur: NEGATIVE mg/dL
LEUKOCYTES UA: NEGATIVE
Nitrite: NEGATIVE
PROTEIN: NEGATIVE mg/dL
Specific Gravity, Urine: 1.015 (ref 1.005–1.030)
UROBILINOGEN UA: 2 mg/dL — AB (ref 0.0–1.0)
pH: 7.5 (ref 5.0–8.0)

## 2013-08-10 LAB — URINE MICROSCOPIC-ADD ON

## 2013-08-10 LAB — POCT PREGNANCY, URINE: PREG TEST UR: NEGATIVE

## 2013-08-10 MED ORDER — NORETHIN ACE-ETH ESTRAD-FE 1-20 MG-MCG(24) PO TABS: 1.0000 | ORAL_TABLET | Freq: Every day | ORAL | Status: DC

## 2013-08-10 NOTE — Discharge Instructions (Signed)

## 2013-08-10 NOTE — MAU Note (Signed)
Both periods are heavy, first 2 days are heavier than usual, changing every 1-2 hrs.
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
## 2013-08-10 NOTE — MAU Note (Signed)
Pt presents to MAU with c/o burning with urination and after urination for 3 days.

## 2013-08-10 NOTE — MAU Note (Addendum)
Burning with urination, 2 days ago started, pain and burning after urination. Past 3 yrs has had  2 periods a month. Did this after had tubal but had been normal for over a year.  Urine has a foul odor.

## 2013-08-11 LAB — GC/CHLAMYDIA PROBE AMP
CT PROBE, AMP APTIMA: NEGATIVE
GC Probe RNA: NEGATIVE

## 2013-08-22 ENCOUNTER — Encounter: Payer: Self-pay | Admitting: Family Medicine

## 2013-09-25 ENCOUNTER — Encounter: Payer: Self-pay | Admitting: Family Medicine

## 2013-12-18 ENCOUNTER — Emergency Department (HOSPITAL_COMMUNITY)
Admission: EM | Admit: 2013-12-18 | Discharge: 2013-12-18 | Disposition: A | Discharge status: Home / Self Care | Payer: Medicaid Other | Attending: Emergency Medicine | Admitting: Emergency Medicine

## 2013-12-18 ENCOUNTER — Encounter (HOSPITAL_COMMUNITY): Payer: Self-pay | Admitting: Emergency Medicine

## 2013-12-18 DIAGNOSIS — Z9851 Tubal ligation status: Secondary | ICD-10-CM | POA: Insufficient documentation

## 2013-12-18 DIAGNOSIS — F3289 Other specified depressive episodes: Secondary | ICD-10-CM | POA: Insufficient documentation

## 2013-12-18 DIAGNOSIS — Z3202 Encounter for pregnancy test, result negative: Secondary | ICD-10-CM | POA: Insufficient documentation

## 2013-12-18 DIAGNOSIS — Z91040 Latex allergy status: Secondary | ICD-10-CM | POA: Insufficient documentation

## 2013-12-18 DIAGNOSIS — R197 Diarrhea, unspecified: Secondary | ICD-10-CM | POA: Insufficient documentation

## 2013-12-18 DIAGNOSIS — Z79899 Other long term (current) drug therapy: Secondary | ICD-10-CM | POA: Insufficient documentation

## 2013-12-18 DIAGNOSIS — F17200 Nicotine dependence, unspecified, uncomplicated: Secondary | ICD-10-CM | POA: Insufficient documentation

## 2013-12-18 DIAGNOSIS — F329 Major depressive disorder, single episode, unspecified: Secondary | ICD-10-CM | POA: Insufficient documentation

## 2013-12-18 DIAGNOSIS — F411 Generalized anxiety disorder: Secondary | ICD-10-CM | POA: Insufficient documentation

## 2013-12-18 DIAGNOSIS — R112 Nausea with vomiting, unspecified: Secondary | ICD-10-CM

## 2013-12-18 DIAGNOSIS — R1084 Generalized abdominal pain: Secondary | ICD-10-CM | POA: Insufficient documentation

## 2013-12-18 LAB — URINALYSIS, ROUTINE W REFLEX MICROSCOPIC
Bilirubin Urine: NEGATIVE
GLUCOSE, UA: NEGATIVE mg/dL
HGB URINE DIPSTICK: NEGATIVE
Ketones, ur: NEGATIVE mg/dL
LEUKOCYTES UA: NEGATIVE
Nitrite: NEGATIVE
PH: 8.5 — AB (ref 5.0–8.0)
Protein, ur: 30 mg/dL — AB
Specific Gravity, Urine: 1.026 (ref 1.005–1.030)
Urobilinogen, UA: 1 mg/dL (ref 0.0–1.0)

## 2013-12-18 LAB — URINE MICROSCOPIC-ADD ON

## 2013-12-18 LAB — CBC WITH DIFFERENTIAL/PLATELET
Basophils Absolute: 0 10*3/uL (ref 0.0–0.1)
Basophils Relative: 0 % (ref 0–1)
Eosinophils Absolute: 0 10*3/uL (ref 0.0–0.7)
Eosinophils Relative: 1 % (ref 0–5)
HCT: 38.3 % (ref 36.0–46.0)
Hemoglobin: 12.3 g/dL (ref 12.0–15.0)
LYMPHS ABS: 1.3 10*3/uL (ref 0.7–4.0)
Lymphocytes Relative: 22 % (ref 12–46)
MCH: 30.6 pg (ref 26.0–34.0)
MCHC: 32.1 g/dL (ref 30.0–36.0)
MCV: 95.3 fL (ref 78.0–100.0)
Monocytes Absolute: 0.3 10*3/uL (ref 0.1–1.0)
Monocytes Relative: 6 % (ref 3–12)
NEUTROS PCT: 71 % (ref 43–77)
Neutro Abs: 4.1 10*3/uL (ref 1.7–7.7)
Platelets: 259 10*3/uL (ref 150–400)
RBC: 4.02 MIL/uL (ref 3.87–5.11)
RDW: 12.7 % (ref 11.5–15.5)
WBC: 5.8 10*3/uL (ref 4.0–10.5)

## 2013-12-18 LAB — COMPREHENSIVE METABOLIC PANEL
ALBUMIN: 4.4 g/dL (ref 3.5–5.2)
ALK PHOS: 61 U/L (ref 39–117)
ALT: 9 U/L (ref 0–35)
AST: 15 U/L (ref 0–37)
BUN: 14 mg/dL (ref 6–23)
CO2: 28 mEq/L (ref 19–32)
Calcium: 9.5 mg/dL (ref 8.4–10.5)
Chloride: 104 mEq/L (ref 96–112)
Creatinine, Ser: 0.8 mg/dL (ref 0.50–1.10)
GFR calc Af Amer: >90 mL/min (ref >90)
GFR calc non Af Amer: >90 mL/min (ref >90)
GLUCOSE: 110 mg/dL — AB (ref 70–99)
POTASSIUM: 3.9 meq/L (ref 3.7–5.3)
SODIUM: 140 meq/L (ref 137–147)
Total Bilirubin: 0.3 mg/dL (ref 0.3–1.2)
Total Protein: 7.3 g/dL (ref 6.0–8.3)

## 2013-12-18 LAB — LIPASE, BLOOD: Lipase: 15 U/L (ref 11–59)

## 2013-12-18 LAB — POC URINE PREG, ED: Preg Test, Ur: NEGATIVE

## 2013-12-18 MED ORDER — MORPHINE SULFATE 4 MG/ML IJ SOLN
4.0000 mg | Freq: Once | INTRAMUSCULAR | Status: AC
  Administered 2013-12-18: 4 mg via INTRAVENOUS
  Filled 2013-12-18: qty 1

## 2013-12-18 MED ORDER — SODIUM CHLORIDE 0.9 % IV BOLUS (SEPSIS)
1000.0000 mL | Freq: Once | INTRAVENOUS | Status: AC
  Administered 2013-12-18: 1000 mL via INTRAVENOUS

## 2013-12-18 MED ORDER — PROMETHAZINE HCL 25 MG PO TABS: 25.0000 mg | ORAL_TABLET | Freq: Four times a day (QID) | ORAL | Status: DC | PRN

## 2013-12-18 MED ORDER — METOCLOPRAMIDE HCL 5 MG/ML IJ SOLN
10.0000 mg | Freq: Once | INTRAMUSCULAR | Status: AC
  Administered 2013-12-18: 10 mg via INTRAVENOUS
  Filled 2013-12-18: qty 2

## 2013-12-18 MED ORDER — ONDANSETRON HCL 4 MG/2ML IJ SOLN
4.0000 mg | Freq: Once | INTRAMUSCULAR | Status: AC
  Administered 2013-12-18: 4 mg via INTRAVENOUS
  Filled 2013-12-18: qty 2

## 2013-12-18 NOTE — Discharge Instructions (Signed)
Clear liquid diet today. Make sure to drink plenty of fluids. Phenergan for nausea as needed. You can also take imodium for diarrhea. Follow up with primary care doctor. Return if your symptoms are worsening.   Viral Gastroenteritis Viral gastroenteritis is also known as stomach flu. This condition affects the stomach and intestinal tract. It can cause sudden diarrhea and vomiting. The illness typically lasts 3 to 8 days. Most people develop an immune response that eventually gets rid of the virus. While this natural response develops, the virus can make you quite ill. CAUSES  Many different viruses can cause gastroenteritis, such as rotavirus or noroviruses. You can catch one of these viruses by consuming contaminated food or water. You may also catch a virus by sharing utensils or other personal items with an infected person or by touching a contaminated surface. SYMPTOMS  The most common symptoms are diarrhea and vomiting. These problems can cause a severe loss of body fluids (dehydration) and a body salt (electrolyte) imbalance. Other symptoms may include:  Fever.  Headache.  Fatigue.  Abdominal pain. DIAGNOSIS  Your caregiver can usually diagnose viral gastroenteritis based on your symptoms and a physical exam. A stool sample may also be taken to test for the presence of viruses or other infections. TREATMENT  This illness typically goes away on its own. Treatments are aimed at rehydration. The most serious cases of viral gastroenteritis involve vomiting so severely that you are not able to keep fluids down. In these cases, fluids must be given through an intravenous line (IV). HOME CARE INSTRUCTIONS   Drink enough fluids to keep your urine clear or pale yellow. Drink small amounts of fluids frequently and increase the amounts as tolerated.  Ask your caregiver for specific rehydration instructions.  Avoid:  Foods high in sugar.  Alcohol.  Carbonated  drinks.  Tobacco.  Juice.  Caffeine drinks.  Extremely hot or cold fluids.  Fatty, greasy foods.  Too much intake of anything at one time.  Dairy products until 24 to 48 hours after diarrhea stops.  You may consume probiotics. Probiotics are active cultures of beneficial bacteria. They may lessen the amount and number of diarrheal stools in adults. Probiotics can be found in yogurt with active cultures and in supplements.  Wash your hands well to avoid spreading the virus.  Only take over-the-counter or prescription medicines for pain, discomfort, or fever as directed by your caregiver. Do not give aspirin to children. Antidiarrheal medicines are not recommended.  Ask your caregiver if you should continue to take your regular prescribed and over-the-counter medicines.  Keep all follow-up appointments as directed by your caregiver. SEEK IMMEDIATE MEDICAL CARE IF:   You are unable to keep fluids down.  You do not urinate at least once every 6 to 8 hours.  You develop shortness of breath.  You notice blood in your stool or vomit. This may look like coffee grounds.  You have abdominal pain that increases or is concentrated in one small area (localized).  You have persistent vomiting or diarrhea.  You have a fever.  The patient is a child younger than 3 months, and he or she has a fever.  The patient is a child older than 3 months, and he or she has a fever and persistent symptoms.  The patient is a child older than 3 months, and he or she has a fever and symptoms suddenly get worse.  The patient is a baby, and he or she has no tears when crying.  MAKE SURE YOU:   Understand these instructions.  Will watch your condition.  Will get help right away if you are not doing well or get worse. Document Released: 07/13/2005 Document Revised: 10/05/2011 Document Reviewed: 04/29/2011 [facility] Patient Information 2014 [place], [place].

## 2013-12-18 NOTE — ED Provider Notes (Signed)
CSN: [id number]     Arrival date & time 12/18/13  9794 History   First MD Initiated Contact with Patient 12/18/13 0601     Chief Complaint  Patient presents with  . Abdominal Pain  . Nausea  . Emesis  . Diarrhea     (Consider location/radiation/quality/duration/timing/severity/associated sxs/prior Treatment) HPI Angela Mosley is a 28 y.o. female who presents to ED with complaint of nausea, vomiting, diarrhea, abdominal pain. Pt states her symptoms began last night around 11pm. States she has had multiple episodes of emesis accompanied by watery diarrhea. No recent ill contacts. Did not take any medications for this. States unable to keep anything down. Denies any fever, admits to chills. Denies any chest pain or back pain. No urinary symptoms. No vaginal discharge or bleeding. No prior abdominal problems, no prior abdominal surgeries.   Past Medical History  Diagnosis Date  . Hyperemesis gravidarum   . Anxiety   . PTSD (post-traumatic stress disorder)   . Depression    Past Surgical History  Procedure Laterality Date  . Mouth surgery  2005  . Tubal ligation     No family history on file. History  Substance Use Topics  . Smoking status: Current Every Day Smoker -- 0.50 packs/day for 10 years  . Smokeless tobacco: Never Used  . Alcohol Use: No   OB History   Grav Para Term Preterm Abortions TAB SAB Ect Mult Living   5 5 5  0 0 0 0 0 0 5     Review of Systems  Constitutional: Negative for fever and chills.  Respiratory: Negative for cough, chest tightness and shortness of breath.   Cardiovascular: Negative for chest pain, palpitations and leg swelling.  Gastrointestinal: Positive for nausea, vomiting, abdominal pain and diarrhea. Negative for constipation.  Genitourinary: Negative for dysuria, flank pain, vaginal bleeding, vaginal discharge, vaginal pain and pelvic pain.  Musculoskeletal: Negative for arthralgias, myalgias, neck pain and neck stiffness.  Skin: Negative  for rash.  Neurological: Negative for dizziness, weakness and headaches.  All other systems reviewed and are negative.     Allergies  Hydrocodone; Latex; and Tape  Home Medications   Prior to Admission medications   Medication Sig Start Date End Date Taking? Authorizing Provider  acetaminophen (TYLENOL) 325 MG tablet Take 650 mg by mouth every 6 (six) hours as needed for moderate pain.   Yes Historical Provider, MD  OLANZapine (ZYPREXA) 5 MG tablet Take 5 mg by mouth at bedtime.   Yes Historical Provider, MD   BP 113/68  Pulse 90  Temp(Src) 97.9 F (36.6 C) (Oral)  Resp 18  Ht 5\' 2"  (1.575 m)  Wt 167 lb (75.751 kg)  BMI 30.54 kg/m2  SpO2 99%  LMP 12/11/2013 Physical Exam  Nursing note and vitals reviewed. Constitutional: She is oriented to person, place, and time. She appears well-developed and well-nourished. No distress.  HENT:  Head: Normocephalic.  Eyes: Conjunctivae are normal.  Neck: Neck supple.  Cardiovascular: Normal rate, regular rhythm and normal heart sounds.   Pulmonary/Chest: Effort normal and breath sounds normal. No respiratory distress. She has no wheezes. She has no rales.  Abdominal: Soft. Bowel sounds are normal. She exhibits no distension. There is tenderness. There is no rebound and no guarding.  Diffuse tenderness  Musculoskeletal: She exhibits no edema.  Neurological: She is alert and oriented to person, place, and time.  Skin: Skin is warm and dry.  Psychiatric: She has a normal mood and affect. Her behavior is normal.  ED Course  Procedures (including critical care time) Labs Review Labs Reviewed  COMPREHENSIVE METABOLIC PANEL - Abnormal; Notable for the following:    Glucose, Bld 110 (*)    All other components within normal limits  URINALYSIS, ROUTINE W REFLEX MICROSCOPIC - Abnormal; Notable for the following:    APPearance CLOUDY (*)    pH 8.5 (*)    Protein, ur 30 (*)    All other components within normal limits  URINE  MICROSCOPIC-ADD ON - Abnormal; Notable for the following:    Bacteria, UA FEW (*)    All other components within normal limits  CBC WITH DIFFERENTIAL  LIPASE, BLOOD  POC URINE PREG, ED    Imaging Review No results found.   EKG Interpretation None      MDM   Final diagnoses:  Nausea vomiting and diarrhea    PT with nausea, vomiting, diarrhea. Abdomen soft. Diffusely tender. Will get labs, IV fluids, anti emetics and pain medicaiton. Given large number of vomiting and diarrhea episodes, diffuse abdominal pain with minimal tenderness, low suspicion for acute abdomen.   9:20 AM Pt feeling better. Will try PO trial  9:39 AM Pt kept fluids down, continues to feel better. Suspect gastroenteritis. Will d/c home with phenergan. Discussed with patient symptoms that should prompt her return to the ED. She voiced understanding.   Filed Vitals:   12/18/13 0600 12/18/13 0630 12/18/13 0700 12/18/13 0800  BP: 112/65 120/82 118/59 101/50  Pulse: 96 69 77 73  Temp:      TempSrc:      Resp:    16  Height:      Weight:      SpO2: 100% 99% 99% 98%     Angela Jacobsonatyana A Ridge Lafond, PA-C 12/18/13 0940

## 2013-12-18 NOTE — ED Notes (Signed)
Pt does not want protocol zofran order.

## 2013-12-18 NOTE — ED Notes (Signed)
Pt states she started having N/V/D and abd pain last night at 2300. Was fine all day and it just started. Has vomited about 5 times and every time she throws up she has diarrhea.

## 2013-12-20 NOTE — ED Provider Notes (Signed)
Medical screening examination/treatment/procedure(s) were performed by non-physician practitioner and as supervising physician I was immediately available for consultation/collaboration.   EKG Interpretation None        Julie Manly, MD 12/20/13 1855 

## 2014-05-07 ENCOUNTER — Emergency Department (HOSPITAL_COMMUNITY)
Admission: EM | Admit: 2014-05-07 | Discharge: 2014-05-07 | Disposition: A | Discharge status: Home / Self Care | Payer: Medicaid Other | Source: Home / Self Care | Attending: Family Medicine | Admitting: Family Medicine

## 2014-05-07 ENCOUNTER — Encounter (HOSPITAL_COMMUNITY): Payer: Self-pay | Admitting: Emergency Medicine

## 2014-05-07 DIAGNOSIS — S93401A Sprain of unspecified ligament of right ankle, initial encounter: Secondary | ICD-10-CM

## 2014-05-07 NOTE — ED Notes (Signed)
Right ankle pain after missing a step, abrasion to ankle.  Able to move toes, toes feel normal to her-no numbness

## 2014-05-07 NOTE — Discharge Instructions (Signed)
Wear ankle support as needed for comfort,  Elevate,, Ankle Sprain An ankle sprain is an injury to the strong, fibrous tissues (ligaments) that hold the bones of your ankle joint together.  CAUSES An ankle sprain is usually caused by a fall or by twisting your ankle. Ankle sprains most commonly occur when you step on the outer edge of your foot, and your ankle turns inward. People who participate in sports are more prone to these types of injuries.  SYMPTOMS   Pain in your ankle. The pain may be present at rest or only when you are trying to stand or walk.  Swelling.  Bruising. Bruising may develop immediately or within 1 to 2 days after your injury.  Difficulty standing or walking, particularly when turning corners or changing directions. DIAGNOSIS  Your caregiver will ask you details about your injury and perform a physical exam of your ankle to determine if you have an ankle sprain. During the physical exam, your caregiver will press on and apply pressure to specific areas of your foot and ankle. Your caregiver will try to move your ankle in certain ways. An X-ray exam may be done to be sure a bone was not broken or a ligament did not separate from one of the bones in your ankle (avulsion fracture).  TREATMENT  Certain types of braces can help stabilize your ankle. Your caregiver can make a recommendation for this. Your caregiver may recommend the use of medicine for pain. If your sprain is severe, your caregiver may refer you to a surgeon who helps to restore function to parts of your skeletal system (orthopedist) or a physical therapist. HOME CARE INSTRUCTIONS   Apply ice to your injury for 1-2 days or as directed by your caregiver. Applying ice helps to reduce inflammation and pain.  Put ice in a plastic bag.  Place a towel between your skin and the bag.  Leave the ice on for 15-20 minutes at a time, every 2 hours while you are awake.  Only take over-the-counter or prescription  medicines for pain, discomfort, or fever as directed by your caregiver.  Elevate your injured ankle above the level of your heart as much as possible for 2-3 days.  If your caregiver recommends crutches, use them as instructed. Gradually put weight on the affected ankle. Continue to use crutches or a cane until you can walk without feeling pain in your ankle.  If you have a plaster splint, wear the splint as directed by your caregiver. Do not rest it on anything harder than a pillow for the first 24 hours. Do not put weight on it. Do not get it wet. You may take it off to take a shower or bath.  You may have been given an elastic bandage to wear around your ankle to provide support. If the elastic bandage is too tight (you have numbness or tingling in your foot or your foot becomes cold and blue), adjust the bandage to make it comfortable.  If you have an air splint, you may blow more air into it or let air out to make it more comfortable. You may take your splint off at night and before taking a shower or bath. Wiggle your toes in the splint several times per day to decrease swelling. SEEK MEDICAL CARE IF:   You have rapidly increasing bruising or swelling.  Your toes feel extremely cold or you lose feeling in your foot.  Your pain is not relieved with medicine. SEEK IMMEDIATE MEDICAL  CARE IF:  Your toes are numb or blue.  You have severe pain that is increasing. MAKE SURE YOU:   Understand these instructions.  Will watch your condition.  Will get help right away if you are not doing well or get worse. Document Released: 07/13/2005 Document Revised: 04/06/2012 Document Reviewed: 07/25/2011 [facility] Patient Information 2015 [place], [place]. This information is not intended to replace advice given to you by your health care provider. Make sure you discuss any questions you have with your health care provider. activity as tolerated. advil and ice as needed, return or see orthopedist if  further problems.

## 2014-05-07 NOTE — ED Provider Notes (Signed)
CSN: [id number]     Arrival date & time 05/07/14  1958 History   First MD Initiated Contact with Patient 05/07/14 2017     Chief Complaint  Patient presents with  . Ankle Pain   (Consider location/radiation/quality/duration/timing/severity/associated sxs/prior Treatment) Patient is a 28 y.o. female presenting with ankle pain. The history is provided by the patient.  Ankle Pain Location:  Ankle Ankle location:  R ankle Pain details:    Quality:  Sharp   Radiates to:  Does not radiate   Severity:  Mild   Onset quality:  Sudden   Duration:  1 hour   Progression:  Unchanged Chronicity:  New (rolled right ankle after missing step at home, ) Dislocation: no   Foreign body present:  No foreign bodies Prior injury to area:  No Relieved by:  Ice Worsened by:  Bearing weight Associated symptoms: no back pain     Past Medical History  Diagnosis Date  . Hyperemesis gravidarum   . Anxiety   . PTSD (post-traumatic stress disorder)   . Depression    Past Surgical History  Procedure Laterality Date  . Mouth surgery  2005  . Tubal ligation     No family history on file. History  Substance Use Topics  . Smoking status: Current Every Day Smoker -- 0.50 packs/day for 10 years  . Smokeless tobacco: Never Used  . Alcohol Use: No   OB History   Grav Para Term Preterm Abortions TAB SAB Ect Mult Living   5 5 5  0 0 0 0 0 0 5     Review of Systems  Constitutional: Negative.   Musculoskeletal: Positive for gait problem. Negative for back pain and joint swelling.  Skin: Negative.     Allergies  Hydrocodone; Latex; and Tape  Home Medications   Prior to Admission medications   Medication Sig Start Date End Date Taking? Authorizing Provider  acetaminophen (TYLENOL) 325 MG tablet Take 650 mg by mouth every 6 (six) hours as needed for moderate pain.    Historical Provider, MD  OLANZapine (ZYPREXA) 5 MG tablet Take 5 mg by mouth at bedtime.    Historical Provider, MD  promethazine  (PHENERGAN) 25 MG tablet Take 1 tablet (25 mg total) by mouth every 6 (six) hours as needed for nausea or vomiting. 12/18/13   Tatyana A Kirichenko, PA-C   BP 105/67  Pulse 82  Temp(Src) 98.2 F (36.8 C) (Oral)  Resp 14  SpO2 98%  LMP 05/05/2014 Physical Exam  Nursing note and vitals reviewed. Constitutional: She is oriented to person, place, and time. She appears well-developed and well-nourished. No distress.  Musculoskeletal: She exhibits tenderness. She exhibits no edema.       Right ankle: She exhibits normal range of motion, no swelling, no ecchymosis, no deformity and normal pulse. Tenderness. Lateral malleolus and AITFL tenderness found. No CF ligament, no posterior TFL, no head of 5th metatarsal and no proximal fibula tenderness found. Achilles tendon normal. Achilles tendon exhibits normal Thompson's test results.  Neurological: She is alert and oriented to person, place, and time.  Skin: Skin is warm and dry.    ED Course  Procedures (including critical care time) Labs Review Labs Reviewed - No data to display  Imaging Review No results found.   MDM   1. Ankle sprain, right, initial encounter        Linna HoffJames D Yukiko Minnich, MD 05/07/14 2033

## 2014-05-28 ENCOUNTER — Encounter (HOSPITAL_COMMUNITY): Payer: Self-pay | Admitting: Emergency Medicine

## 2014-06-12 ENCOUNTER — Emergency Department (HOSPITAL_COMMUNITY): Payer: Medicaid Other

## 2014-06-12 ENCOUNTER — Emergency Department (HOSPITAL_COMMUNITY)
Admission: EM | Admit: 2014-06-12 | Discharge: 2014-06-12 | Disposition: A | Discharge status: Home / Self Care | Payer: Medicaid Other | Attending: Emergency Medicine | Admitting: Emergency Medicine

## 2014-06-12 ENCOUNTER — Encounter (HOSPITAL_COMMUNITY): Payer: Self-pay | Admitting: Emergency Medicine

## 2014-06-12 DIAGNOSIS — R079 Chest pain, unspecified: Secondary | ICD-10-CM

## 2014-06-12 DIAGNOSIS — Z3202 Encounter for pregnancy test, result negative: Secondary | ICD-10-CM | POA: Diagnosis not present

## 2014-06-12 DIAGNOSIS — R0789 Other chest pain: Secondary | ICD-10-CM | POA: Diagnosis not present

## 2014-06-12 DIAGNOSIS — Z91040 Latex allergy status: Secondary | ICD-10-CM | POA: Insufficient documentation

## 2014-06-12 DIAGNOSIS — Z720 Tobacco use: Secondary | ICD-10-CM | POA: Insufficient documentation

## 2014-06-12 DIAGNOSIS — Z8659 Personal history of other mental and behavioral disorders: Secondary | ICD-10-CM | POA: Diagnosis not present

## 2014-06-12 DIAGNOSIS — R0602 Shortness of breath: Secondary | ICD-10-CM | POA: Diagnosis not present

## 2014-06-12 LAB — URINE MICROSCOPIC-ADD ON

## 2014-06-12 LAB — BASIC METABOLIC PANEL
Anion gap: 10 (ref 5–15)
BUN: 9 mg/dL (ref 6–23)
CO2: 25 mEq/L (ref 19–32)
Calcium: 9.1 mg/dL (ref 8.4–10.5)
Chloride: 106 mEq/L (ref 96–112)
Creatinine, Ser: 0.57 mg/dL (ref 0.50–1.10)
GFR calc non Af Amer: >90 mL/min (ref >90)
Glucose, Bld: 102 mg/dL — ABNORMAL HIGH (ref 70–99)
POTASSIUM: 3.6 meq/L — AB (ref 3.7–5.3)
SODIUM: 141 meq/L (ref 137–147)

## 2014-06-12 LAB — CBC
HCT: 34.2 % — ABNORMAL LOW (ref 36.0–46.0)
Hemoglobin: 11.2 g/dL — ABNORMAL LOW (ref 12.0–15.0)
MCH: 31.3 pg (ref 26.0–34.0)
MCHC: 32.7 g/dL (ref 30.0–36.0)
MCV: 95.5 fL (ref 78.0–100.0)
Platelets: 267 10*3/uL (ref 150–400)
RBC: 3.58 MIL/uL — ABNORMAL LOW (ref 3.87–5.11)
RDW: 12.6 % (ref 11.5–15.5)
WBC: 5.1 10*3/uL (ref 4.0–10.5)

## 2014-06-12 LAB — URINALYSIS, ROUTINE W REFLEX MICROSCOPIC
BILIRUBIN URINE: NEGATIVE
Glucose, UA: NEGATIVE mg/dL
HGB URINE DIPSTICK: NEGATIVE
KETONES UR: NEGATIVE mg/dL
NITRITE: NEGATIVE
Protein, ur: NEGATIVE mg/dL
SPECIFIC GRAVITY, URINE: 1.015 (ref 1.005–1.030)
Urobilinogen, UA: 1 mg/dL (ref 0.0–1.0)
pH: 7.5 (ref 5.0–8.0)

## 2014-06-12 LAB — I-STAT TROPONIN, ED: TROPONIN I, POC: 0.01 ng/mL (ref 0.00–0.08)

## 2014-06-12 LAB — POC URINE PREG, ED: Preg Test, Ur: NEGATIVE

## 2014-06-12 MED ORDER — SODIUM CHLORIDE 0.9 % IV BOLUS (SEPSIS)
1000.0000 mL | INTRAVENOUS | Status: AC
  Administered 2014-06-12: 1000 mL via INTRAVENOUS

## 2014-06-12 MED ORDER — NAPROXEN 500 MG PO TABS: 500.0000 mg | ORAL_TABLET | Freq: Two times a day (BID) | ORAL | Status: DC

## 2014-06-12 MED ORDER — KETOROLAC TROMETHAMINE 30 MG/ML IJ SOLN
30.0000 mg | INTRAMUSCULAR | Status: AC
  Administered 2014-06-12: 30 mg via INTRAVENOUS
  Filled 2014-06-12: qty 1

## 2014-06-12 MED ORDER — METOCLOPRAMIDE HCL 5 MG/ML IJ SOLN
10.0000 mg | INTRAMUSCULAR | Status: AC
  Administered 2014-06-12: 10 mg via INTRAVENOUS
  Filled 2014-06-12: qty 2

## 2014-06-12 MED ORDER — DIPHENHYDRAMINE HCL 50 MG/ML IJ SOLN
25.0000 mg | INTRAMUSCULAR | Status: AC
  Administered 2014-06-12: 25 mg via INTRAVENOUS
  Filled 2014-06-12: qty 1

## 2014-06-12 MED ORDER — CYCLOBENZAPRINE HCL 10 MG PO TABS: 10.0000 mg | ORAL_TABLET | Freq: Two times a day (BID) | ORAL | Status: DC | PRN

## 2014-06-12 NOTE — Discharge Instructions (Signed)
Please follow the directions provided.  Be sure to follow-up with your primary care provider regarding your pain to ensure you are getting better. You may take the naproxen twice a day and the flexeril twice a day.  Don't hesitate to return for any new, worsening or concerning symptoms.    SEEK IMMEDIATE MEDICAL CARE IF:  You have increased chest pain or pain that spreads to your arm, neck, jaw, back, or abdomen.  You have shortness of breath.  You have an increasing cough, or you cough up blood.  You have severe back or abdominal pain.  You feel nauseous or vomit.  You have severe weakness.  You faint.  You have chills.

## 2014-06-12 NOTE — ED Provider Notes (Signed)
CSN: [id number]     Arrival date & time 06/12/14  1406 History   First MD Initiated Contact with Patient 06/12/14 1539     Chief Complaint  Patient presents with  . Chest Pain   (Consider location/radiation/quality/duration/timing/severity/associated sxs/prior Treatment) HPI Angela Mosley is a 28 yo female presenting with report of left sided chest pain that began a few hours PTA.  Pt states she was standing at work when the pain began and it radiated into her neck and her left arm.  She states the pain also made her short of breath and she felt hot.  She denies any recent surgery, exogenous estrogen, hemoptysis, unilateral leg swelling or leg pain, history of DVT or PE.  She also denies nausea, vomiting, diarrhea or recent illness or cough.     Past Medical History  Diagnosis Date  . Hyperemesis gravidarum   . Anxiety   . PTSD (post-traumatic stress disorder)   . Depression    Past Surgical History  Procedure Laterality Date  . Mouth surgery  2005  . Tubal ligation     No family history on file. History  Substance Use Topics  . Smoking status: Current Every Day Smoker -- 0.50 packs/day for 10 years  . Smokeless tobacco: Never Used  . Alcohol Use: No   OB History    Gravida Para Term Preterm AB TAB SAB Ectopic Multiple Living   5 5 5  0 0 0 0 0 0 5     Review of Systems  Constitutional: Negative for fever and chills.  HENT: Negative for sore throat.   Eyes: Negative for visual disturbance.  Respiratory: Positive for shortness of breath. Negative for cough.   Cardiovascular: Positive for chest pain. Negative for leg swelling.  Gastrointestinal: Negative for nausea, vomiting and diarrhea.  Genitourinary: Negative for dysuria.  Musculoskeletal: Negative for myalgias.  Skin: Negative for rash.  Neurological: Negative for syncope, weakness, numbness and headaches.   Allergies  Hydrocodone; Latex; and Tape  Home Medications   Prior to Admission medications     Medication Sig Start Date End Date Taking? Authorizing Provider  acetaminophen (TYLENOL) 325 MG tablet Take 650 mg by mouth every 6 (six) hours as needed for moderate pain.    Historical Provider, MD  OLANZapine (ZYPREXA) 5 MG tablet Take 5 mg by mouth at bedtime.    Historical Provider, MD  promethazine (PHENERGAN) 25 MG tablet Take 1 tablet (25 mg total) by mouth every 6 (six) hours as needed for nausea or vomiting. 12/18/13   Tatyana A Kirichenko, PA-C   BP 103/53 mmHg  Pulse 82  Temp(Src) 97.9 F (36.6 C) (Oral)  Resp 20  Ht 5\' 2"  (1.575 m)  Wt 175 lb (79.379 kg)  BMI 32.00 kg/m2  SpO2 98%  LMP 05/27/2014 Physical Exam  Constitutional: She appears well-developed and well-nourished. No distress.  HENT:  Head: Normocephalic and atraumatic.  Mouth/Throat: Oropharynx is clear and moist. No oropharyngeal exudate.  Eyes: Conjunctivae are normal.  Neck: Neck supple. No thyromegaly present.  Cardiovascular: Normal rate, regular rhythm and intact distal pulses.  Exam reveals no gallop and no friction rub.   No murmur heard. Pulmonary/Chest: Effort normal and breath sounds normal. No respiratory distress. She has no wheezes. She has no rales. She exhibits no tenderness.    Abdominal: Soft. There is no tenderness.  Musculoskeletal: She exhibits no tenderness.  Lymphadenopathy:    She has no cervical adenopathy.  Neurological: She is alert.  Skin: Skin is warm and  dry. No rash noted. She is not diaphoretic.  Psychiatric: She has a normal mood and affect.  Nursing note and vitals reviewed.   ED Course  Procedures (including critical care time) Labs Review Labs Reviewed  CBC - Abnormal; Notable for the following:    RBC 3.58 (*)    Hemoglobin 11.2 (*)    HCT 34.2 (*)    All other components within normal limits  BASIC METABOLIC PANEL - Abnormal; Notable for the following:    Potassium 3.6 (*)    Glucose, Bld 102 (*)    All other components within normal limits  I-STAT  TROPOININ, ED  POC URINE PREG, ED    Imaging Review Dg Chest 2 View  06/12/2014   CLINICAL DATA:  28 year old female with chest pain and pressure on the left radiating to the left upper extremity. Shortness of Breath. Initial encounter.  EXAM: CHEST  2 VIEW  COMPARISON:  Portable chest radiograph 05/10/2013.  FINDINGS: Normal lung volumes. Normal cardiac size and mediastinal contours. Visualized tracheal air column is within normal limits. The lungs are clear. No pneumothorax or effusion. No osseous abnormality identified.  IMPRESSION: Negative, no acute cardiopulmonary abnormality.   Electronically Signed   By: Augusto GambleLee  Hall M.D.   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  Chest pain is not likely of cardiac or pulmonary etiology based on exam and history, she is perc negative, her vital signs are stable, no tracheal deviation, no JVD or new murmur, RRR, breath sounds equal bilaterally, EKG without acute abnormalities, negative troponin, and negative CXR. Discharge instructions include follow up with PCP in regards to today's hospital visit and return to the ED is CP becomes exertional, associated with diaphoresis or nausea, radiates to left jaw/arm, worsens or becomes concerning in any way. Pt aware of plan and in agreement.  Return precautions provided.      Filed Vitals:   06/12/14 1414 06/12/14 1421 06/12/14 1714  BP:  103/53 111/70  Pulse:  82 82  Temp:  97.9 F (36.6 C) 98 F (36.7 C)  TempSrc:  Oral   Resp:  20 20  Height:  5\' 2"  (1.575 m)   Weight:  175 lb (79.379 kg)   SpO2: 98% 98% 100%   Meds given in ED:  Medications  sodium chloride 0.9 % bolus 1,000 mL (0 mLs Intravenous Stopped 06/12/14 1742)  ketorolac (TORADOL) 30 MG/ML injection 30 mg (30 mg Intravenous Given 06/12/14 1645)  metoCLOPramide  (REGLAN) injection 10 mg (10 mg Intravenous Given 06/12/14 1645)  diphenhydrAMINE (BENADRYL) injection 25 mg (25 mg Intravenous Given 06/12/14 1645)    Discharge Medication List as of 06/12/2014  5:15 PM    START taking these medications   Details  cyclobenzaprine (FLEXERIL) 10 MG tablet Take 1 tablet (10 mg total) by mouth 2 (two) times daily as needed for muscle spasms., Starting 06/12/2014, Until Discontinued, Print    naproxen (NAPROSYN) 500 MG tablet Take 1 tablet (500 mg total) by mouth 2 (two) times daily with a meal., Starting 06/12/2014, Until Discontinued, Print           Harle BattiestElizabeth Deandrea Vanpelt, NP 06/14/14 [id number]  Gilda Creasehristopher J. Pollina, MD 06/18/14 1052

## 2014-06-12 NOTE — ED Notes (Signed)
Pt via [facility] for chest pressure that started while standing at work, pt denies any syncope or n/v/d. Pt axox 4, nad noted. Pt took 325 mg of aspirin and EMS gave 2 nitro with no relief. EKG done at triage.

## 2014-06-12 NOTE — ED Notes (Signed)
Pt sts pain radiates down left shoulder/arm and up left side of neck

## 2014-08-30 ENCOUNTER — Emergency Department (HOSPITAL_COMMUNITY)
Admission: EM | Admit: 2014-08-30 | Discharge: 2014-08-30 | Disposition: A | Discharge status: Home / Self Care | Payer: Medicaid Other | Source: Home / Self Care | Attending: Emergency Medicine | Admitting: Emergency Medicine

## 2014-08-30 ENCOUNTER — Encounter (HOSPITAL_COMMUNITY): Payer: Self-pay | Admitting: *Deleted

## 2014-08-30 DIAGNOSIS — J069 Acute upper respiratory infection, unspecified: Secondary | ICD-10-CM

## 2014-08-30 LAB — POCT RAPID STREP A: Streptococcus, Group A Screen (Direct): NEGATIVE

## 2014-08-30 MED ORDER — DICLOFENAC SODIUM 75 MG PO TBEC: DELAYED_RELEASE_TABLET | ORAL | Status: DC

## 2014-08-30 MED ORDER — TRAMADOL HCL 50 MG PO TABS: ORAL_TABLET | ORAL | Status: DC

## 2014-08-30 MED ORDER — IPRATROPIUM BROMIDE 0.06 % NA SOLN: 2.0000 | Freq: Four times a day (QID) | NASAL | Status: DC

## 2014-08-30 NOTE — ED Notes (Signed)
Pt  Reports  Symptoms  Of  Body  Aches       sorethroat           Pressure      Behind ears     With  Symptoms  X  sev   Days           Cough  Is  For  The  Most    Part   Non   Productive       At  Times

## 2014-08-30 NOTE — ED Provider Notes (Signed)
   Chief Complaint   Sore Throat   History of Present Illness   Angela Mosley is a 29 year old female who's had a two-day history of sore throat, lymphadenopathy, headache, aching in neck, cough productive green sputum, chest tightness, chest pain, nasal congestion with green drainage, dizziness, loose stools, anorexia, and chills. No known sick exposures.  Review of Systems   Other than as noted above, the patient denies any of the following symptoms: Systemic:  No fevers, chills, sweats, or myalgias. Eye:  No redness or discharge. ENT:  No ear pain, headache, nasal congestion, drainage, sinus pressure, or sore throat. Neck:  No neck pain, stiffness, or swollen glands. Lungs:  No cough, sputum production, hemoptysis, wheezing, chest tightness, shortness of breath or chest pain. GI:  No abdominal pain, nausea, vomiting or diarrhea.  PMFSH   Past medical history, family history, social history, meds, and allergies were reviewed.   Physical exam   Vital signs:  BP 120/66 mmHg  Pulse 70  Temp(Src) 98.3 F (36.8 C) (Oral)  Resp 19  SpO2 100%  LMP 08/27/2014 General:  Alert and oriented.  In no distress.  Skin warm and dry. Eye:  No conjunctival injection or drainage. Lids were normal. ENT:  TMs and canals were normal, without erythema or inflammation.  Nasal mucosa was clear and uncongested, without drainage.  Mucous membranes were moist.  Pharynx was clear with no exudate or drainage.  There were no oral ulcerations or lesions. Neck:  Supple, no adenopathy, tenderness or mass. Lungs:  No respiratory distress.  Lungs were clear to auscultation, without wheezes, rales or rhonchi.  Breath sounds were clear and equal bilaterally.  Heart:  Regular rhythm, without gallops, murmers or rubs. Skin:  Clear, warm, and dry, without rash or lesions.  Labs   Results for orders placed or performed during the hospital encounter of 08/30/14  POCT rapid strep A [facility])  Result  Value Ref Range   Streptococcus, Group A Screen (Direct) NEGATIVE NEGATIVE    Assessment     The encounter diagnosis was Viral URI.  There is no evidence of pneumonia, strep throat, sinusitis, otitis media.    Plan    1.  Meds:  The following meds were prescribed:   Discharge Medication List as of 08/30/2014  2:32 PM    START taking these medications   Details  diclofenac (VOLTAREN) 75 MG EC tablet Take 1 twice daily for sore throat or aches., Normal    ipratropium (ATROVENT) 0.06 % nasal spray Place 2 sprays into both nostrils 4 (four) times daily., Starting 08/30/2014, Until Discontinued, Normal    traMADol (ULTRAM) 50 MG tablet Take 1 to 2 tablets every 8 hours as needed for cough, Print        2.  Patient Education/Counseling:  The patient was given appropriate handouts, self care instructions, and instructed in symptomatic relief.  Instructed to get extra fluids and extra rest.    3.  Follow up:  The patient was told to follow up here if no better in 3 to 4 days, or sooner if becoming worse in any way, and given some red flag symptoms such as increasing fever, difficulty breathing, chest pain, or persistent vomiting which would prompt immediate return.       Reuben Likesavid C Jermanie Minshall, MD 08/30/14 [phone]

## 2014-08-30 NOTE — Discharge Instructions (Signed)

## 2014-09-01 LAB — CULTURE, GROUP A STREP

## 2014-12-12 ENCOUNTER — Encounter (HOSPITAL_COMMUNITY): Payer: Self-pay | Admitting: Emergency Medicine

## 2014-12-12 ENCOUNTER — Emergency Department (INDEPENDENT_AMBULATORY_CARE_PROVIDER_SITE_OTHER)
Admission: EM | Admit: 2014-12-12 | Discharge: 2014-12-12 | Disposition: A | Discharge status: Home / Self Care | Payer: Medicaid Other | Source: Home / Self Care | Attending: Family Medicine | Admitting: Family Medicine

## 2014-12-12 ENCOUNTER — Other Ambulatory Visit (HOSPITAL_COMMUNITY)
Admission: RE | Admit: 2014-12-12 | Discharge: 2014-12-12 | Disposition: A | Discharge status: Home / Self Care | Payer: Medicaid Other | Source: Ambulatory Visit | Attending: Family Medicine | Admitting: Family Medicine

## 2014-12-12 DIAGNOSIS — N760 Acute vaginitis: Secondary | ICD-10-CM

## 2014-12-12 DIAGNOSIS — N390 Urinary tract infection, site not specified: Secondary | ICD-10-CM

## 2014-12-12 DIAGNOSIS — Z113 Encounter for screening for infections with a predominantly sexual mode of transmission: Secondary | ICD-10-CM | POA: Insufficient documentation

## 2014-12-12 LAB — POCT URINALYSIS DIP (DEVICE)
Bilirubin Urine: NEGATIVE
Glucose, UA: NEGATIVE mg/dL
KETONES UR: NEGATIVE mg/dL
NITRITE: POSITIVE — AB
PH: 7 (ref 5.0–8.0)
PROTEIN: NEGATIVE mg/dL
Specific Gravity, Urine: 1.02 (ref 1.005–1.030)
Urobilinogen, UA: 1 mg/dL (ref 0.0–1.0)

## 2014-12-12 LAB — POCT PREGNANCY, URINE: Preg Test, Ur: NEGATIVE

## 2014-12-12 MED ORDER — FLUCONAZOLE 150 MG PO TABS: 150.0000 mg | ORAL_TABLET | Freq: Once | ORAL | Status: DC

## 2014-12-12 MED ORDER — CEPHALEXIN 500 MG PO CAPS: 500.0000 mg | ORAL_CAPSULE | Freq: Two times a day (BID) | ORAL | Status: DC

## 2014-12-12 MED ORDER — METRONIDAZOLE 500 MG PO TABS: 500.0000 mg | ORAL_TABLET | Freq: Two times a day (BID) | ORAL | Status: DC

## 2014-12-12 NOTE — Discharge Instructions (Signed)
Thank you for coming in today. If your belly pain worsens, or you have high fever, bad vomiting, blood in your stool or black tarry stool go to the Emergency Room.   Urinary Tract Infection Urinary tract infections (UTIs) can develop anywhere along your urinary tract. Your urinary tract is your body's drainage system for removing wastes and extra water. Your urinary tract includes two kidneys, two ureters, a bladder, and a urethra. Your kidneys are a pair of bean-shaped organs. Each kidney is about the size of your fist. They are located below your ribs, one on each side of your spine. CAUSES Infections are caused by microbes, which are microscopic organisms, including fungi, viruses, and bacteria. These organisms are so small that they can only be seen through a microscope. Bacteria are the microbes that most commonly cause UTIs. SYMPTOMS  Symptoms of UTIs may vary by age and gender of the patient and by the location of the infection. Symptoms in young women typically include a frequent and intense urge to urinate and a painful, burning feeling in the bladder or urethra during urination. Older women and men are more likely to be tired, shaky, and weak and have muscle aches and abdominal pain. A fever may mean the infection is in your kidneys. Other symptoms of a kidney infection include pain in your back or sides below the ribs, nausea, and vomiting. DIAGNOSIS To diagnose a UTI, your caregiver will ask you about your symptoms. Your caregiver also will ask to provide a urine sample. The urine sample will be tested for bacteria and white blood cells. White blood cells are made by your body to help fight infection. TREATMENT  Typically, UTIs can be treated with medication. Because most UTIs are caused by a bacterial infection, they usually can be treated with the use of antibiotics. The choice of antibiotic and length of treatment depend on your symptoms and the type of bacteria causing your  infection. HOME CARE INSTRUCTIONS  If you were prescribed antibiotics, take them exactly as your caregiver instructs you. Finish the medication even if you feel better after you have only taken some of the medication.  Drink enough water and fluids to keep your urine clear or pale yellow.  Avoid caffeine, tea, and carbonated beverages. They tend to irritate your bladder.  Empty your bladder often. Avoid holding urine for long periods of time.  Empty your bladder before and after sexual intercourse.  After a bowel movement, women should cleanse from front to back. Use each tissue only once. SEEK MEDICAL CARE IF:   You have back pain.  You develop a fever.  Your symptoms do not begin to resolve within 3 days. SEEK IMMEDIATE MEDICAL CARE IF:   You have severe back pain or lower abdominal pain.  You develop chills.  You have nausea or vomiting.  You have continued burning or discomfort with urination. MAKE SURE YOU:   Understand these instructions.  Will watch your condition.  Will get help right away if you are not doing well or get worse. Document Released: 04/22/2005 Document Revised: 01/12/2012 Document Reviewed: 08/21/2011 [facility] Patient Information 2015 [employer] [place]. This information is not intended to replace advice given to you by your health care provider. Make sure you discuss any questions you have with your health care provider.   Vaginitis Vaginitis is an inflammation of the vagina. It is most often caused by a change in the normal balance of the bacteria and yeast that live in the vagina. This change  in balance causes an overgrowth of certain bacteria or yeast, which causes the inflammation. There are different types of vaginitis, but the most common types are:  Bacterial vaginosis.  Yeast infection (candidiasis).  Trichomoniasis vaginitis. This is a sexually transmitted infection (STI).  Viral vaginitis.  Atropic vaginitis.  Allergic  vaginitis. CAUSES  The cause depends on the type of vaginitis. Vaginitis can be caused by:  Bacteria (bacterial vaginosis).  Yeast (yeast infection).  A parasite (trichomoniasis vaginitis)  A virus (viral vaginitis).  Low hormone levels (atrophic vaginitis). Low hormone levels can occur during pregnancy, breastfeeding, or after menopause.  Irritants, such as bubble baths, scented tampons, and feminine sprays (allergic vaginitis). Other factors can change the normal balance of the yeast and bacteria that live in the vagina. These include:  Antibiotic medicines.  Poor hygiene.  Diaphragms, vaginal sponges, spermicides, birth control pills, and intrauterine devices (IUD).  Sexual intercourse.  Infection.  Uncontrolled diabetes.  A weakened immune system. SYMPTOMS  Symptoms can vary depending on the cause of the vaginitis. Common symptoms include:  Abnormal vaginal discharge.  The discharge is white, gray, or yellow with bacterial vaginosis.  The discharge is thick, white, and cheesy with a yeast infection.  The discharge is frothy and yellow or greenish with trichomoniasis.  A bad vaginal odor.  The odor is fishy with bacterial vaginosis.  Vaginal itching, pain, or swelling.  Painful intercourse.  Pain or burning when urinating. Sometimes, there are no symptoms. TREATMENT  Treatment will vary depending on the type of infection.   Bacterial vaginosis and trichomoniasis are often treated with antibiotic creams or pills.  Yeast infections are often treated with antifungal medicines, such as vaginal creams or suppositories.  Viral vaginitis has no cure, but symptoms can be treated with medicines that relieve discomfort. Your sexual partner should be treated as well.  Atrophic vaginitis may be treated with an estrogen cream, pill, suppository, or vaginal ring. If vaginal dryness occurs, lubricants and moisturizing creams may help. You may be told to avoid scented  soaps, sprays, or douches.  Allergic vaginitis treatment involves quitting the use of the product that is causing the problem. Vaginal creams can be used to treat the symptoms. HOME CARE INSTRUCTIONS   Take all medicines as directed by your caregiver.  Keep your genital area clean and dry. Avoid soap and only rinse the area with water.  Avoid douching. It can remove the healthy bacteria in the vagina.  Do not use tampons or have sexual intercourse until your vaginitis has been treated. Use sanitary pads while you have vaginitis.  Wipe from front to back. This avoids the spread of bacteria from the rectum to the vagina.  Let air reach your genital area.  Wear cotton underwear to decrease moisture buildup.  Avoid wearing underwear while you sleep until your vaginitis is gone.  Avoid tight pants and underwear or nylons without a cotton panel.  Take off wet clothing (especially bathing suits) as soon as possible.  Use mild, non-scented products. Avoid using irritants, such as:  Scented feminine sprays.  Fabric softeners.  Scented detergents.  Scented tampons.  Scented soaps or bubble baths.  Practice safe sex and use condoms. Condoms may prevent the spread of trichomoniasis and viral vaginitis. SEEK MEDICAL CARE IF:   You have abdominal pain.  You have a fever or persistent symptoms for more than 2-3 days.  You have a fever and your symptoms suddenly get worse. Document Released: 05/10/2007 Document Revised: 04/06/2012 Document Reviewed: 12/24/2011  ExitCare Patient Information 2015 [place]. This information is not intended to replace advice given to you by your health care provider. Make sure you discuss any questions you have with your health care provider.

## 2014-12-12 NOTE — ED Provider Notes (Signed)
Angela Mosley is a 29 y.o. female who presents to Urgent Care today for urinary frequency urgency and dysuria associated with vaginal discharge. No fevers or chills nausea vomiting or diarrhea. No abdominal pain. She's tried some Tylenol which helped a little.   Past Medical History  Diagnosis Date  . Hyperemesis gravidarum   . Anxiety   . PTSD (post-traumatic stress disorder)   . Depression    Past Surgical History  Procedure Laterality Date  . Mouth surgery  2005  . Tubal ligation     History  Substance Use Topics  . Smoking status: Current Every Day Smoker -- 0.50 packs/day for 10 years  . Smokeless tobacco: Never Used  . Alcohol Use: No   ROS as above Medications: No current facility-administered medications for this encounter.   Current Outpatient Prescriptions  Medication Sig Dispense Refill  . acetaminophen (TYLENOL) 325 MG tablet Take 650 mg by mouth every 6 (six) hours as needed for moderate pain.    . cephALEXin (KEFLEX) 500 MG capsule Take 1 capsule (500 mg total) by mouth 2 (two) times daily. 14 capsule 0  . fluconazole (DIFLUCAN) 150 MG tablet Take 1 tablet (150 mg total) by mouth once. 1 tablet 1  . metroNIDAZOLE (FLAGYL) 500 MG tablet Take 1 tablet (500 mg total) by mouth 2 (two) times daily. 14 tablet 0  . [DISCONTINUED] ipratropium (ATROVENT) 0.06 % nasal spray Place 2 sprays into both nostrils 4 (four) times daily. 15 mL 12  . [DISCONTINUED] promethazine (PHENERGAN) 25 MG tablet Take 1 tablet (25 mg total) by mouth every 6 (six) hours as needed for nausea or vomiting. (Patient not taking: Reported on 06/12/2014) 15 tablet 0   Allergies  Allergen Reactions  . Hydrocodone Hives and Itching  . Latex Hives  . Tape Hives     Exam:  BP 117/76 mmHg  Pulse 62  Temp(Src) 97.6 F (36.4 C) (Oral)  Resp 16  SpO2 97%  LMP 11/26/2014 Gen: Well NAD HEENT: EOMI,  MMM Lungs: Normal work of breathing. CTABL Heart: RRR no MRG Abd: NABS, Soft. Nondistended,  Nontender no CV angle tenderness to percussion Exts: Brisk capillary refill, warm and well perfused.  GYN: Normal external genitalia. Vaginal canal with clumpy white discharge normal-appearing cervix. Nontender  Results for orders placed or performed during the hospital encounter of 12/12/14 (from the past 24 hour(s))  POCT urinalysis dip (device)     Status: Abnormal   Collection Time: 12/12/14  1:04 PM  Result Value Ref Range   Glucose, UA NEGATIVE NEGATIVE mg/dL   Bilirubin Urine NEGATIVE NEGATIVE   Ketones, ur NEGATIVE NEGATIVE mg/dL   Specific Gravity, Urine 1.020 1.005 - 1.030   Hgb urine dipstick LARGE (A) NEGATIVE   pH 7.0 5.0 - 8.0   Protein, ur NEGATIVE NEGATIVE mg/dL   Urobilinogen, UA 1.0 0.0 - 1.0 mg/dL   Nitrite POSITIVE (A) NEGATIVE   Leukocytes, UA TRACE (A) NEGATIVE  Pregnancy, urine POC     Status: None   Collection Time: 12/12/14  1:15 PM  Result Value Ref Range   Preg Test, Ur NEGATIVE NEGATIVE   No results found.  Assessment and Plan: 29 y.o. female with UTI and vaginitis. Urine culture pending as well as vaginal cytology for gonorrhea Chlamydia trichomonas BV and yeast. Serology for HIV and syphilis also pending. Treatment empirically with Keflex, fluconazole, and metronidazole.  Discussed warning signs or symptoms. Please see discharge instructions. Patient expresses understanding.     Rodolph BongEvan S Ellesse Antenucci, MD 12/12/14  1328 

## 2014-12-12 NOTE — ED Notes (Signed)
Pt c/o white vag d/c and poss UTI onset 2 days Sx include hematuria and dysuria after she has finished voiding urine Sx also include fevers and chills Alert, no signs of acute distress.

## 2014-12-13 LAB — CERVICOVAGINAL ANCILLARY ONLY
Chlamydia: NEGATIVE
Neisseria Gonorrhea: NEGATIVE
Wet Prep (BD Affirm): POSITIVE — AB

## 2014-12-13 LAB — HIV ANTIBODY (ROUTINE TESTING W REFLEX): HIV Screen 4th Generation wRfx: NONREACTIVE

## 2014-12-13 LAB — RPR: RPR: NONREACTIVE

## 2014-12-14 LAB — URINE CULTURE
Colony Count: >=100000
Special Requests: NORMAL

## 2014-12-14 NOTE — ED Notes (Addendum)
GC/Chlamydia neg., Gardnerella pos., HIV/RPR non-reactive, Urine culture: >100,000 colonies E. Coli.  Pt. adequately treated with Flagyl and Keflex. Vassie MoselleYork, Darus Hershman M 12/14/2014

## 2015-09-06 ENCOUNTER — Encounter: Payer: Self-pay | Admitting: Podiatry

## 2015-09-13 ENCOUNTER — Encounter (HOSPITAL_COMMUNITY): Payer: Self-pay | Admitting: *Deleted

## 2015-09-13 ENCOUNTER — Emergency Department (HOSPITAL_COMMUNITY)
Admission: EM | Admit: 2015-09-13 | Discharge: 2015-09-13 | Disposition: A | Discharge status: Home / Self Care | Payer: Medicaid Other | Attending: Emergency Medicine | Admitting: Emergency Medicine

## 2015-09-13 DIAGNOSIS — Z8659 Personal history of other mental and behavioral disorders: Secondary | ICD-10-CM | POA: Diagnosis not present

## 2015-09-13 DIAGNOSIS — L500 Allergic urticaria: Secondary | ICD-10-CM | POA: Insufficient documentation

## 2015-09-13 DIAGNOSIS — Z91040 Latex allergy status: Secondary | ICD-10-CM | POA: Insufficient documentation

## 2015-09-13 DIAGNOSIS — Y998 Other external cause status: Secondary | ICD-10-CM | POA: Diagnosis not present

## 2015-09-13 DIAGNOSIS — Y9289 Other specified places as the place of occurrence of the external cause: Secondary | ICD-10-CM | POA: Insufficient documentation

## 2015-09-13 DIAGNOSIS — X58XXXA Exposure to other specified factors, initial encounter: Secondary | ICD-10-CM | POA: Insufficient documentation

## 2015-09-13 DIAGNOSIS — T7840XA Allergy, unspecified, initial encounter: Secondary | ICD-10-CM | POA: Insufficient documentation

## 2015-09-13 DIAGNOSIS — Y9389 Activity, other specified: Secondary | ICD-10-CM | POA: Diagnosis not present

## 2015-09-13 DIAGNOSIS — R0789 Other chest pain: Secondary | ICD-10-CM | POA: Insufficient documentation

## 2015-09-13 DIAGNOSIS — F17200 Nicotine dependence, unspecified, uncomplicated: Secondary | ICD-10-CM | POA: Diagnosis not present

## 2015-09-13 DIAGNOSIS — T450X5A Adverse effect of antiallergic and antiemetic drugs, initial encounter: Secondary | ICD-10-CM | POA: Diagnosis not present

## 2015-09-13 DIAGNOSIS — Z3202 Encounter for pregnancy test, result negative: Secondary | ICD-10-CM | POA: Insufficient documentation

## 2015-09-13 LAB — CBC WITH DIFFERENTIAL/PLATELET
BASOS ABS: 0 10*3/uL (ref 0.0–0.1)
BASOS PCT: 0 %
EOS PCT: 0 %
Eosinophils Absolute: 0 10*3/uL (ref 0.0–0.7)
HCT: 39.1 % (ref 36.0–46.0)
Hemoglobin: 12.6 g/dL (ref 12.0–15.0)
LYMPHS ABS: 2 10*3/uL (ref 0.7–4.0)
Lymphocytes Relative: 29 %
MCH: 30.8 pg (ref 26.0–34.0)
MCHC: 32.2 g/dL (ref 30.0–36.0)
MCV: 95.6 fL (ref 78.0–100.0)
MONOS PCT: 3 %
Monocytes Absolute: 0.2 10*3/uL (ref 0.1–1.0)
NEUTROS PCT: 68 %
Neutro Abs: 4.7 10*3/uL (ref 1.7–7.7)
PLATELETS: 318 10*3/uL (ref 150–400)
RBC: 4.09 MIL/uL (ref 3.87–5.11)
RDW: 13 % (ref 11.5–15.5)
WBC: 6.9 10*3/uL (ref 4.0–10.5)

## 2015-09-13 LAB — BASIC METABOLIC PANEL
ANION GAP: 7 (ref 5–15)
BUN: 11 mg/dL (ref 6–20)
CALCIUM: 8.9 mg/dL (ref 8.9–10.3)
CO2: 26 mmol/L (ref 22–32)
CREATININE: 0.57 mg/dL (ref 0.44–1.00)
Chloride: 106 mmol/L (ref 101–111)
GLUCOSE: 116 mg/dL — AB (ref 65–99)
Potassium: 3.5 mmol/L (ref 3.5–5.1)
Sodium: 139 mmol/L (ref 135–145)

## 2015-09-13 LAB — PREGNANCY, URINE: Preg Test, Ur: NEGATIVE

## 2015-09-13 MED ORDER — DIPHENHYDRAMINE HCL 50 MG/ML IJ SOLN
50.0000 mg | Freq: Once | INTRAMUSCULAR | Status: AC
  Administered 2015-09-13: 50 mg via INTRAVENOUS
  Filled 2015-09-13: qty 1

## 2015-09-13 MED ORDER — FAMOTIDINE IN NACL 20-0.9 MG/50ML-% IV SOLN
20.0000 mg | Freq: Once | INTRAVENOUS | Status: AC
  Administered 2015-09-13: 20 mg via INTRAVENOUS
  Filled 2015-09-13: qty 50

## 2015-09-13 MED ORDER — PREDNISONE 10 MG (21) PO TBPK
10.0000 mg | ORAL_TABLET | Freq: Every day | ORAL | Status: DC
Start: 2015-09-13 — End: 2015-11-10

## 2015-09-13 MED ORDER — PREDNISONE 20 MG PO TABS
60.0000 mg | ORAL_TABLET | Freq: Once | ORAL | Status: AC
  Administered 2015-09-13: 60 mg via ORAL
  Filled 2015-09-13: qty 3

## 2015-09-13 MED ORDER — IBUPROFEN 200 MG PO TABS
600.0000 mg | ORAL_TABLET | Freq: Once | ORAL | Status: AC
  Administered 2015-09-13: 600 mg via ORAL
  Filled 2015-09-13: qty 3

## 2015-09-13 NOTE — ED Notes (Signed)
PA at the bedside.

## 2015-09-13 NOTE — Discharge Instructions (Signed)
Angela Mosley,  Nice meeting you! Please follow-up with your primary care provider. Return to the emergency department if you develop shortness of breath, chest pain, throat swelling, facial swelling, tongue swelling. Feel better soon!  S. Lane Hacker, PA-C   Allergies An allergy is an abnormal reaction to a substance by the body's defense system (immune system). Allergies can develop at any age. WHAT CAUSES ALLERGIES? An allergic reaction happens when the immune system mistakenly reacts to a normally harmless substance, called an allergen, as if it were harmful. The immune system releases antibodies to fight the substance. Antibodies eventually release a chemical called histamine into the bloodstream. The release of histamine is meant to protect the body from infection, but it also causes discomfort. An allergic reaction can be triggered by:  Eating an allergen.  Inhaling an allergen.  Touching an allergen. WHAT TYPES OF ALLERGIES ARE THERE? There are many types of allergies. Common types include:  Seasonal allergies. People with this type of allergy are usually allergic to substances that are only present during certain seasons, such as molds and pollens.  Food allergies.  Drug allergies.  Insect allergies.  Animal dander allergies. WHAT ARE SYMPTOMS OF ALLERGIES? Possible allergy symptoms include:  Swelling of the lips, face, tongue, mouth, or throat.  Sneezing, coughing, or wheezing.  Nasal congestion.  Tingling in the mouth.  Rash.  Itching.  Itchy, red, swollen areas of skin (hives).  Watery eyes.  Vomiting.  Diarrhea.  Dizziness.  Lightheadedness.  Fainting.  Trouble breathing or swallowing.  Chest tightness.  Rapid heartbeat. HOW ARE ALLERGIES DIAGNOSED? Allergies are diagnosed with a medical and family history and one or more of the following:  Skin tests.  Blood tests.  A food diary. A food diary is a record of all the foods  and drinks you have in a day and of all the symptoms you experience.  The results of an elimination diet. An elimination diet involves eliminating foods from your diet and then adding them back in one by one to find out if a certain food causes an allergic reaction. HOW ARE ALLERGIES TREATED? There is no cure for allergies, but allergic reactions can be treated with medicine. Severe reactions usually need to be treated at a hospital. HOW CAN REACTIONS BE PREVENTED? The best way to prevent an allergic reaction is by avoiding the substance you are allergic to. Allergy shots and medicines can also help prevent reactions in some cases. People with severe allergic reactions may be able to prevent a life-threatening reaction called anaphylaxis with a medicine given right after exposure to the allergen.   This information is not intended to replace advice given to you by your health care provider. Make sure you discuss any questions you have with your health care provider.   Document Released: 10/06/2002 Document Revised: 08/03/2014 Document Reviewed: 04/24/2014 Elsevier Interactive Patient Education [employer]

## 2015-09-13 NOTE — ED Provider Notes (Signed)
CSN: [id number]     Arrival date & time 09/13/15  1734 History   First MD Initiated Contact with Patient 09/13/15 1747     Chief Complaint  Patient presents with  . Allergic Reaction   HPI Angela Mosley is a 30 y.o. female PMH significant for anxiety, PTSD, depression presenting with a 1 day history of hives. She states that she awoke this morning around 7:00 with hives, took 50 mg Benadryl, went back to sleep, woke up itching into 50 more milligrams of Benadryl around 11:00 am.  She states her reactions started in the anterior portion of her thighs and has spread throughout her body. She endorses the feeling like she has to clear her throat, which started an hour ago. She denies tongue/mouth/throat/facial swelling, tongue/mouth/throat itching, sneezing, shortness of breath, throat closing, chest pain. She denies recent changes in detergent, soaps, shaving cream, foods, history of this before. She is concerned because she had intercourse with her boyfriend yesterday who was given an antibiotic and she thinks this is the cause of her allergic reaction.  Past Medical History  Diagnosis Date  . Hyperemesis gravidarum   . Anxiety   . PTSD (post-traumatic stress disorder)   . Depression    Past Surgical History  Procedure Laterality Date  . Mouth surgery  2005  . Tubal ligation     History reviewed. No pertinent family history. Social History  Substance Use Topics  . Smoking status: Current Every Day Smoker -- 0.50 packs/day for 10 years  . Smokeless tobacco: Never Used  . Alcohol Use: No   OB History    Gravida Para Term Preterm AB TAB SAB Ectopic Multiple Living   0 0 0 0 0 0 5     Review of Systems  Ten systems are reviewed and are negative for acute change except as noted in the HPI  Allergies  Hydrocodone; Latex; and Tape  Home Medications   Prior to Admission medications   Medication Sig Start Date End Date Taking? Authorizing Provider  diphenhydrAMINE  (BENADRYL) 25 MG tablet Take 100 mg by mouth every 6 (six) hours as needed for allergies.   Yes Historical Provider, MD  acetaminophen (TYLENOL) 325 MG tablet Take 650 mg by mouth every 6 (six) hours as needed for moderate pain.    Historical Provider, MD  cephALEXin (KEFLEX) 500 MG capsule Take 1 capsule (500 mg total) by mouth 2 (two) times daily. Patient not taking: Reported on 09/13/2015 12/12/14   Rodolph Bong, MD  fluconazole (DIFLUCAN) 150 MG tablet Take 1 tablet (150 mg total) by mouth once. Patient not taking: Reported on 09/13/2015 12/12/14   Rodolph Bong, MD  metroNIDAZOLE (FLAGYL) 500 MG tablet Take 1 tablet (500 mg total) by mouth 2 (two) times daily. Patient not taking: Reported on 09/13/2015 12/12/14   Rodolph Bong, MD   BP 125/77 mmHg  Pulse 77  Temp(Src) 98.2 F (36.8 C) (Oral)  Resp 18  SpO2 99%  LMP 09/02/2015 Physical Exam  Constitutional: She appears well-developed and well-nourished. No distress.  HENT:  Head: Normocephalic and atraumatic.  Mouth/Throat: Oropharynx is clear and moist. No oropharyngeal exudate.  Airway intact  Eyes: Conjunctivae are normal. Right eye exhibits no discharge. Left eye exhibits no discharge. No scleral icterus.  Neck: No tracheal deviation present.  Cardiovascular: Normal rate, regular rhythm, normal heart sounds and intact distal pulses.  Exam reveals no gallop and no friction rub.   No murmur heard. Pulmonary/Chest: Effort  normal and breath sounds normal. No respiratory distress. She has no wheezes. She has no rales. She exhibits no tenderness.  Abdominal: Soft. Bowel sounds are normal. She exhibits no distension.  Musculoskeletal: She exhibits no edema.  Lymphadenopathy:    She has no cervical adenopathy.  Neurological: She is alert. Coordination normal.  Skin: Skin is warm and dry. Rash noted. She is not diaphoretic. No erythema.  Diffuse hives present BL extremities and trunk.  Psychiatric: She has a normal mood and affect. Her  behavior is normal.  Nursing note and vitals reviewed.   ED Course  Procedures  Labs Review Labs Reviewed  BASIC METABOLIC PANEL - Abnormal; Notable for the following:    Glucose, Bld 116 (*)    All other components within normal limits  PREGNANCY, URINE  CBC WITH DIFFERENTIAL/PLATELET    EKG Interpretation   Date/Time:  Friday September 13 2015 18:52:53 EST Ventricular Rate:  79 PR Interval:  152 QRS Duration: 101 QT Interval:  365 QTC Calculation: 418 R Axis:   54 Text Interpretation:  Sinus rhythm Borderline T wave abnormalities No  significant change since last tracing Confirmed by LIU MD, DANA ([id number]) on  09/13/2015 6:55:48 PM      MDM   Final diagnoses:  Allergic reaction, initial encounter   Patient nontoxic appearing, vital signs stable. No concern for anaphylaxis at this time. Airway intact. Hives present diffusely throughout extremities and trunk. Medications  ibuprofen (ADVIL,MOTRIN) tablet 600 mg (not administered)  predniSONE (DELTASONE) tablet 60 mg (60 mg Oral Given 09/13/15 1803)  diphenhydrAMINE (BENADRYL) injection 50 mg (50 mg Intravenous Given 09/13/15 1803)  famotidine (PEPCID) IVPB 20 mg premix (20 mg Intravenous New Bag/Given 09/13/15 1803)   Patient states she is now having midsternal chest pain, described as a pressure, and feels this is most likely related to her anxiety. She is requesting something for pain.  Dr. Verdie Mosher evaluated patient and advised discharge if patient is stable after observation.   Went to reevaluate patient and she was eating food. She states she feels much better and her itching has dissipated. EKG, BMP, pregnancy, CBC unremarkable. Patient may be safely discharged home with steroid taper. She has benadryl at home. Discussed reasons for return. Patient to follow-up with primary care provider within one week. Patient in understanding and agreement with the plan.   Melton Krebs, PA-C 09/13/15 2337  Lavera Guise,  MD 09/14/15 [phone]

## 2015-09-13 NOTE — ED Provider Notes (Signed)
Medical screening examination/treatment/procedure(s) were conducted as a shared visit with non-physician practitioner(s) and myself.  I personally evaluated the patient during the encounter.   EKG Interpretation   Date/Time:  Friday September 13 2015 18:52:53 EST Ventricular Rate:  79 PR Interval:  152 QRS Duration: 101 QT Interval:  365 QTC Calculation: 418 R Axis:   54 Text Interpretation:  Sinus rhythm Borderline T wave abnormalities No  significant change since last tracing Confirmed by Shashank Kwasnik MD, Tonia Avino [phone]) on  09/13/2015 6:55:75 PM      30 year old female with history of PTSD and depression who presents with allergic reaction. States that she was in her usual state of health yesterday evening and at 7 AM woke up with hives between her thighs. He took 50 mg of Benadryl and went back to sleep. Subsequently woke up at 11 AM with diffuse hives over her body. Took another 50 mg of Benadryl and subsequently came to the ED for evaluation. States that she feels itchy in her throat but denies any swelling of her throat or oropharynx, difficulty breathing, syncope or near syncope. Does endorse anxiety with some chest tightness. No known allergen and denies any recent new exposures, medications, foods, lotions, soaps or detergents. On arrival with stable vital signs. Does have urticaria throughout all extremities and trunk. Cardiopulmonary exam is unremarkable and oropharynx is clear. Do not suspect anaphylaxis. Treated with steroids, Benadryl, and Pepcid. We'll observe and likely discharge home if reaction improves.  Lavera Guise, MD 09/13/15 2109

## 2015-09-13 NOTE — ED Notes (Signed)
Pt with hives and redness since 0700, took benadryl went to sleep, woke up itching took 2 more benadryl and now is worse.

## 2015-09-13 NOTE — ED Notes (Signed)
Bed: WA02 Expected date:  Expected time:  Means of arrival:  Comments: No monitor

## 2015-09-18 ENCOUNTER — Encounter: Payer: Self-pay | Admitting: Podiatry

## 2015-10-25 NOTE — Progress Notes (Signed)
This encounter was created in error - please disregard.

## 2015-10-26 DIAGNOSIS — B009 Herpesviral infection, unspecified: Secondary | ICD-10-CM | POA: Insufficient documentation

## 2015-11-09 ENCOUNTER — Encounter (HOSPITAL_COMMUNITY): Payer: Self-pay | Admitting: *Deleted

## 2015-11-09 ENCOUNTER — Inpatient Hospital Stay (HOSPITAL_COMMUNITY)
Admission: AD | Admit: 2015-11-09 | Discharge: 2015-11-10 | Disposition: A | Discharge status: Home / Self Care | Payer: Medicaid Other | Source: Ambulatory Visit | Attending: Family Medicine | Admitting: Family Medicine

## 2015-11-09 DIAGNOSIS — R102 Pelvic and perineal pain: Secondary | ICD-10-CM | POA: Diagnosis present

## 2015-11-09 DIAGNOSIS — F17210 Nicotine dependence, cigarettes, uncomplicated: Secondary | ICD-10-CM | POA: Insufficient documentation

## 2015-11-09 DIAGNOSIS — N898 Other specified noninflammatory disorders of vagina: Secondary | ICD-10-CM

## 2015-11-09 DIAGNOSIS — N3001 Acute cystitis with hematuria: Secondary | ICD-10-CM | POA: Diagnosis not present

## 2015-11-09 LAB — POCT PREGNANCY, URINE: Preg Test, Ur: NEGATIVE

## 2015-11-09 NOTE — MAU Note (Signed)
I have a sore on my vagina (the lip) and sore on buttocks. Present for couple days. Area on buttocks has a smell to it.

## 2015-11-10 DIAGNOSIS — N898 Other specified noninflammatory disorders of vagina: Secondary | ICD-10-CM

## 2015-11-10 LAB — URINALYSIS, ROUTINE W REFLEX MICROSCOPIC
Bilirubin Urine: NEGATIVE
Glucose, UA: NEGATIVE mg/dL
Ketones, ur: NEGATIVE mg/dL
NITRITE: POSITIVE — AB
PROTEIN: NEGATIVE mg/dL
Specific Gravity, Urine: >1.03 — ABNORMAL HIGH (ref 1.005–1.030)
pH: 5 (ref 5.0–8.0)

## 2015-11-10 LAB — WET PREP, GENITAL
Sperm: NONE SEEN
Trich, Wet Prep: NONE SEEN
Yeast Wet Prep HPF POC: NONE SEEN

## 2015-11-10 LAB — URINE MICROSCOPIC-ADD ON

## 2015-11-10 LAB — HIV ANTIBODY (ROUTINE TESTING W REFLEX): HIV Screen 4th Generation wRfx: NONREACTIVE

## 2015-11-10 LAB — RPR: RPR Ser Ql: NONREACTIVE

## 2015-11-10 MED ORDER — VALACYCLOVIR HCL 1 G PO TABS: 1000.0000 mg | ORAL_TABLET | Freq: Two times a day (BID) | ORAL | Status: DC

## 2015-11-10 MED ORDER — SULFAMETHOXAZOLE-TRIMETHOPRIM 800-160 MG PO TABS: 1.0000 | ORAL_TABLET | Freq: Two times a day (BID) | ORAL | Status: AC

## 2015-11-10 MED ORDER — VALACYCLOVIR HCL 500 MG PO TABS
1000.0000 mg | ORAL_TABLET | Freq: Once | ORAL | Status: AC
  Administered 2015-11-10: 1000 mg via ORAL
  Filled 2015-11-10: qty 2

## 2015-11-10 MED ORDER — LIDOCAINE HCL 2 % EX GEL
1.0000 "application " | Freq: Once | CUTANEOUS | Status: AC
  Administered 2015-11-10: 1 via TOPICAL
  Filled 2015-11-10: qty 5

## 2015-11-10 NOTE — MAU Provider Note (Signed)
History     CSN: [id number]  Arrival date and time: 11/09/15 2234   First Provider Initiated Contact with Patient 11/10/15 0004      Chief Complaint  Patient presents with  . Vaginal Pain   HPI    Ms.Angela Mosley is a 30 y.o. female [id number] presenting to MAU with vaginal sores and rectal sores. She first noticed the sores 2 days ago; "the sores hurt very badly."  She denies history of genital herpes, and says she has never experienced anything like this. She denies trouble urinating, however does state that it is very painful when the urine touches the sores.  She has not tried anything over the counter for the symptoms.   OB History    Gravida Para Term Preterm AB TAB SAB Ectopic Multiple Living   0 0 0 0 0 0 5      Past Medical History  Diagnosis Date  . Hyperemesis gravidarum   . Anxiety   . PTSD (post-traumatic stress disorder)   . Depression     Past Surgical History  Procedure Laterality Date  . Mouth surgery  2005  . Tubal ligation      History reviewed. No pertinent family history.  Social History  Substance Use Topics  . Smoking status: Current Every Day Smoker -- 0.50 packs/day for 10 years  . Smokeless tobacco: Never Used  . Alcohol Use: No    Allergies:  Allergies  Allergen Reactions  . Hydrocodone Hives and Itching  . Latex Hives  . Tape Hives    Prescriptions prior to admission  Medication Sig Dispense Refill Last Dose  . acetaminophen (TYLENOL) 325 MG tablet Take 650 mg by mouth every 6 (six) hours as needed for moderate pain.   unknown  . cephALEXin (KEFLEX) 500 MG capsule Take 1 capsule (500 mg total) by mouth 2 (two) times daily. (Patient not taking: Reported on 09/13/2015) 14 capsule 0 Completed Course at Unknown time  . diphenhydrAMINE (BENADRYL) 25 MG tablet Take 100 mg by mouth every 6 (six) hours as needed for allergies.   09/13/2015 at Unknown time  . fluconazole (DIFLUCAN) 150 MG tablet Take 1 tablet (150 mg total) by  mouth once. (Patient not taking: Reported on 09/13/2015) 1 tablet 1 Completed Course at Unknown time  . metroNIDAZOLE (FLAGYL) 500 MG tablet Take 1 tablet (500 mg total) by mouth 2 (two) times daily. (Patient not taking: Reported on 09/13/2015) 14 tablet 0 Completed Course at Unknown time  . predniSONE (STERAPRED UNI-PAK 21 TAB) 10 MG (21) TBPK tablet Take 1 tablet (10 mg total) by mouth daily. Take 6 tabs by mouth daily  for 2 days, then 5 tabs for 2 days, then 4 tabs for 2 days, then 3 tabs for 2 days, 2 tabs for 2 days, then 1 tab by mouth daily for 2 days 42 tablet 0    Results for orders placed or performed during the hospital encounter of 11/09/15 (from the past 48 hour(s))  Urinalysis, Routine w reflex microscopic (not at [facility])     Status: Abnormal   Collection Time: 11/09/15 11:05 PM  Result Value Ref Range   Color, Urine YELLOW YELLOW   APPearance CLEAR CLEAR   Specific Gravity, Urine >1.030 (H) 1.005 - 1.030   pH 5.0 5.0 - 8.0   Glucose, UA NEGATIVE NEGATIVE mg/dL   Hgb urine dipstick TRACE (A) NEGATIVE   Bilirubin Urine NEGATIVE NEGATIVE   Ketones, ur NEGATIVE NEGATIVE mg/dL   Protein,  ur NEGATIVE NEGATIVE mg/dL   Nitrite POSITIVE (A) NEGATIVE   Leukocytes, UA TRACE (A) NEGATIVE  Urine microscopic-add on     Status: Abnormal   Collection Time: 11/09/15 11:05 PM  Result Value Ref Range   Squamous Epithelial / LPF 0-5 (A) NONE SEEN   WBC, UA 6-30 0 - 5 WBC/hpf   RBC / HPF 6-30 0 - 5 RBC/hpf   Bacteria, UA MANY (A) NONE SEEN  Pregnancy, urine POC     Status: None   Collection Time: 11/09/15 11:51 PM  Result Value Ref Range   Preg Test, Ur NEGATIVE NEGATIVE    Comment:        THE SENSITIVITY OF THIS METHODOLOGY IS >24 mIU/mL   Wet prep, genital     Status: Abnormal   Collection Time: 11/10/15 12:08 AM  Result Value Ref Range   Yeast Wet Prep HPF POC NONE SEEN NONE SEEN   Trich, Wet Prep NONE SEEN NONE SEEN   Clue Cells Wet Prep HPF POC PRESENT (A) NONE SEEN   WBC, Wet  Prep HPF POC MODERATE (A) NONE SEEN    Comment: MODERATE BACTERIA SEEN   Sperm NONE SEEN   HIV antibody (routine testing) (NOT for [facility])     Status: None   Collection Time: 11/10/15 12:28 AM  Result Value Ref Range   HIV Screen 4th Generation wRfx Non Reactive Non Reactive    Comment: (NOTE) Performed At: [facility] [address] [place], [place] [id number] Mila HomerHancock William F MD WU:[phone]   RPR     Status: None   Collection Time: 11/10/15 12:28 AM  Result Value Ref Range   RPR Ser Ql Non Reactive Non Reactive    Comment: (NOTE) Performed At: [facility] [address] [place], [place] [id number] Mila HomerHancock William F MD VH:[phone]     Review of Systems  Constitutional: Negative for fever and chills.  Gastrointestinal: Negative for nausea, vomiting, abdominal pain, diarrhea and constipation.  Genitourinary: Positive for urgency and frequency. Negative for dysuria.   Physical Exam   Blood pressure 110/59, pulse 89, temperature 98 F (36.7 C), resp. rate 18, height 5\' 3"  (1.6 m), weight 214 lb 6.4 oz (97.251 kg), last menstrual period 10/28/2015.  Physical Exam  Constitutional: She is oriented to person, place, and time. She appears well-developed and well-nourished. No distress.  HENT:  Head: Normocephalic.  Respiratory: Effort normal.  Genitourinary:    There is tenderness in the vagina.  Multiple herpetic, clustered lesions noted near labia minora, majora and rectum.  Tenderness to touch.   Musculoskeletal: Normal range of motion.  Neurological: She is alert and oriented to person, place, and time.  Skin: Skin is warm. She is not diaphoretic.  Psychiatric: Her behavior is normal.    MAU Course  Procedures  None  MDM  HIV RPR GC Wet prep HSV culture Valtrex 1 gram given PO Lidocaine jelly to affected area.   Assessment and Plan    A:  1. Vaginal lesion   2. Acute cystitis with hematuria     P:  Discharge home in stable  condition RX: Valtrex, Bactrim Patient will receive a call in the next 48-72 if HSV-GC is positive Condoms always Return to MAU if symptoms worsen   Duane LopeJennifer I Rasch, NP 11/11/2015 9:32 AM

## 2015-11-10 NOTE — Discharge Instructions (Signed)
Urinary Tract Infection Urinary tract infections (UTIs) can develop anywhere along your urinary tract. Your urinary tract is your body's drainage system for removing wastes and extra water. Your urinary tract includes two kidneys, two ureters, a bladder, and a urethra. Your kidneys are a pair of bean-shaped organs. Each kidney is about the size of your fist. They are located below your ribs, one on each side of your spine. CAUSES Infections are caused by microbes, which are microscopic organisms, including fungi, viruses, and bacteria. These organisms are so small that they can only be seen through a microscope. Bacteria are the microbes that most commonly cause UTIs. SYMPTOMS  Symptoms of UTIs may vary by age and gender of the patient and by the location of the infection. Symptoms in young women typically include a frequent and intense urge to urinate and a painful, burning feeling in the bladder or urethra during urination. Older women and men are more likely to be tired, shaky, and weak and have muscle aches and abdominal pain. A fever may mean the infection is in your kidneys. Other symptoms of a kidney infection include pain in your back or sides below the ribs, nausea, and vomiting. DIAGNOSIS To diagnose a UTI, your caregiver will ask you about your symptoms. Your caregiver will also ask you to provide a urine sample. The urine sample will be tested for bacteria and white blood cells. White blood cells are made by your body to help fight infection. TREATMENT  Typically, UTIs can be treated with medication. Because most UTIs are caused by a bacterial infection, they usually can be treated with the use of antibiotics. The choice of antibiotic and length of treatment depend on your symptoms and the type of bacteria causing your infection. HOME CARE INSTRUCTIONS  If you were prescribed antibiotics, take them exactly as your caregiver instructs you. Finish the medication even if you feel better after  you have only taken some of the medication.  Drink enough water and fluids to keep your urine clear or pale yellow.  Avoid caffeine, tea, and carbonated beverages. They tend to irritate your bladder.  Empty your bladder often. Avoid holding urine for long periods of time.  Empty your bladder before and after sexual intercourse.  After a bowel movement, women should cleanse from front to back. Use each tissue only once. SEEK MEDICAL CARE IF:   You have back pain.  You develop a fever.  Your symptoms do not begin to resolve within 3 days. SEEK IMMEDIATE MEDICAL CARE IF:   You have severe back pain or lower abdominal pain.  You develop chills.  You have nausea or vomiting.  You have continued burning or discomfort with urination. MAKE SURE YOU:   Understand these instructions.  Will watch your condition.  Will get help right away if you are not doing well or get worse.   This information is not intended to replace advice given to you by your health care provider. Make sure you discuss any questions you have with your health care provider.   Document Released: 04/22/2005 Document Revised: 04/03/2015 Document Reviewed: 08/21/2011 Elsevier Interactive Patient Education 2016 Elsevier Inc. Genital Herpes Genital herpes is a common sexually transmitted infection (STI) that is caused by a virus. The virus is spread from person to person through sexual contact. Infection can cause itching, blisters, and sores in the genital area or rectal area. This is called an outbreak. It affects both men and women. Genital herpes is particularly concerning for pregnant women   because the virus can be passed to the baby during delivery and cause serious problems. Genital herpes is also a concern for people with a weakened defense (immune) system. Symptoms of genital herpes may last several days and then go away. However, the virus remains in your body, so you may have more outbreaks of symptoms in  the future. The time between outbreaks varies and can be months or years. CAUSES Genital herpes is caused by a virus called herpes simplex virus (HSV) type 2 or HSV type 1. These viruses are contagious and are most often spread through sexual contact with an infected person. Sexual contact includes vaginal, anal, and oral sex. RISK FACTORS Risk factors for genital herpes include:  Being sexually active with multiple partners.  Having unprotected sex. SIGNS AND SYMPTOMS Symptoms may include:  Pain and itching in the genital area or rectal area.  Small red bumps that turn into blisters and then turn into sores.  Flu-like symptoms, including:  Fever.  Body aches.  Painful urination.  Vaginal discharge. DIAGNOSIS Genital herpes may be diagnosed by:  Physical exam.  Blood test.  Fluid culture test from an open sore. TREATMENT There is no cure for genital herpes. Oral antiviral medicines may be used to speed up healing and to help prevent the return of symptoms. These medicines can also help to reduce the spread of the virus to sexual partners. HOME CARE INSTRUCTIONS  Keep the affected areas dry and clean.  Take medicines only as directed by your health care provider.  Do not have sexual contact during active infections. Genital herpes is contagious.  Practice safe sex. Latex condoms and female condoms may help to prevent the spread of the herpes virus.  Avoid rubbing or touching the blisters and sores. If you do touch the blister or sores:  Wash your hands thoroughly.  Do not touch your eyes afterward.  If you become pregnant, tell your health care provider if you have had genital herpes.  Keep all follow-up visits as directed by your health care provider. This is important. PREVENTION  Use condoms. Although anyone can contract genital herpes during sexual contact even with the use of a condom, a condom can provide some protection.  Avoid having multiple sexual  partners.  Talk to your sexual partner about any symptoms and past history that either of you may have.  Get tested before you have sex. Ask your partner to do the same.  Recognize the symptoms of genital herpes. Do not have sexual contact if you notice these symptoms. SEEK MEDICAL CARE IF:  Your symptoms are not improving with medicine.  Your symptoms return.  You have new symptoms.  You have a fever.  You have abdominal pain.  You have redness, swelling, or pain in your eye. MAKE SURE YOU:  Understand these instructions.  Will watch your condition.  Will get help right away if you are not doing well or get worse.   This information is not intended to replace advice given to you by your health care provider. Make sure you discuss any questions you have with your health care provider.   Document Released: 07/10/2000 Document Revised: 08/03/2014 Document Reviewed: 11/28/2013 Elsevier Interactive Patient Education 2016 Elsevier Inc.  

## 2015-11-11 LAB — GC/CHLAMYDIA PROBE AMP (~~LOC~~) NOT AT ARMC
CHLAMYDIA, DNA PROBE: NEGATIVE
NEISSERIA GONORRHEA: NEGATIVE

## 2015-11-13 LAB — HERPES SIMPLEX VIRUS CULTURE
Culture: DETECTED — AB
SPECIAL REQUESTS: NORMAL

## 2015-11-14 ENCOUNTER — Telehealth: Payer: Self-pay | Admitting: Obstetrics and Gynecology

## 2015-11-14 NOTE — Telephone Encounter (Signed)
Unable to leave VM, mailbox full. Will call back later today.

## 2015-11-15 ENCOUNTER — Telehealth: Payer: Self-pay | Admitting: Advanced Practice Midwife

## 2015-11-15 MED ORDER — ONDANSETRON HCL 4 MG PO TABS: 4.0000 mg | ORAL_TABLET | Freq: Three times a day (TID) | ORAL | Status: DC | PRN

## 2015-11-15 MED ORDER — VALACYCLOVIR HCL 1 G PO TABS: 1000.0000 mg | ORAL_TABLET | Freq: Every day | ORAL | Status: DC

## 2015-11-15 NOTE — Telephone Encounter (Signed)
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  Pt taking Valtrex 1000 mg BID currently for primary outbreak and reports nausea with medication.  Zofran 4 mg PO Q 8 hours sent to pharmacy.  Also, Rx for Valtrex 1000 mg daily x 5 days sent to pharmacy for recurrent outbreaks.  Questions answered, pt to follow up with Gyn provider as needed.

## 2015-11-15 NOTE — Telephone Encounter (Signed)
duplicate

## 2016-06-13 ENCOUNTER — Emergency Department (HOSPITAL_COMMUNITY): Payer: Medicaid Other

## 2016-06-13 ENCOUNTER — Emergency Department (HOSPITAL_COMMUNITY)
Admission: EM | Admit: 2016-06-13 | Discharge: 2016-06-13 | Disposition: A | Discharge status: Home / Self Care | Payer: Medicaid Other | Attending: Physician Assistant | Admitting: Physician Assistant

## 2016-06-13 ENCOUNTER — Encounter (HOSPITAL_COMMUNITY): Payer: Self-pay | Admitting: *Deleted

## 2016-06-13 DIAGNOSIS — Z91040 Latex allergy status: Secondary | ICD-10-CM | POA: Diagnosis not present

## 2016-06-13 DIAGNOSIS — R51 Headache: Secondary | ICD-10-CM | POA: Insufficient documentation

## 2016-06-13 DIAGNOSIS — F17200 Nicotine dependence, unspecified, uncomplicated: Secondary | ICD-10-CM | POA: Insufficient documentation

## 2016-06-13 DIAGNOSIS — R519 Headache, unspecified: Secondary | ICD-10-CM

## 2016-06-13 LAB — URINALYSIS, ROUTINE W REFLEX MICROSCOPIC
BILIRUBIN URINE: NEGATIVE
GLUCOSE, UA: NEGATIVE mg/dL
HGB URINE DIPSTICK: NEGATIVE
KETONES UR: NEGATIVE mg/dL
Nitrite: NEGATIVE
PROTEIN: NEGATIVE mg/dL
Specific Gravity, Urine: 1.022 (ref 1.005–1.030)
pH: 8.5 — ABNORMAL HIGH (ref 5.0–8.0)

## 2016-06-13 LAB — CSF CELL COUNT WITH DIFFERENTIAL
RBC COUNT CSF: 0 /mm3
RBC Count, CSF: 6 /mm3 — ABNORMAL HIGH
TUBE #: 1
Tube #: 4
WBC CSF: 0 /mm3 (ref 0–5)
WBC, CSF: 1 /mm3 (ref 0–5)

## 2016-06-13 LAB — COMPREHENSIVE METABOLIC PANEL
ALBUMIN: 3.5 g/dL (ref 3.5–5.0)
ALT: 16 U/L (ref 14–54)
AST: 22 U/L (ref 15–41)
Alkaline Phosphatase: 47 U/L (ref 38–126)
Anion gap: 6 (ref 5–15)
BUN: 6 mg/dL (ref 6–20)
CHLORIDE: 110 mmol/L (ref 101–111)
CO2: 21 mmol/L — ABNORMAL LOW (ref 22–32)
CREATININE: 0.63 mg/dL (ref 0.44–1.00)
Calcium: 8.7 mg/dL — ABNORMAL LOW (ref 8.9–10.3)
GFR calc Af Amer: >60 mL/min (ref >60)
GLUCOSE: 101 mg/dL — AB (ref 65–99)
POTASSIUM: 4.4 mmol/L (ref 3.5–5.1)
Sodium: 137 mmol/L (ref 135–145)
Total Bilirubin: 0.8 mg/dL (ref 0.3–1.2)
Total Protein: 6 g/dL — ABNORMAL LOW (ref 6.5–8.1)

## 2016-06-13 LAB — CBC WITH DIFFERENTIAL/PLATELET
Basophils Absolute: 0 10*3/uL (ref 0.0–0.1)
Basophils Relative: 0 %
EOS ABS: 0.1 10*3/uL (ref 0.0–0.7)
EOS PCT: 1 %
HCT: 38.8 % (ref 36.0–46.0)
Hemoglobin: 12.4 g/dL (ref 12.0–15.0)
LYMPHS ABS: 2.1 10*3/uL (ref 0.7–4.0)
LYMPHS PCT: 26 %
MCH: 30.1 pg (ref 26.0–34.0)
MCHC: 32 g/dL (ref 30.0–36.0)
MCV: 94.2 fL (ref 78.0–100.0)
MONO ABS: 0.4 10*3/uL (ref 0.1–1.0)
MONOS PCT: 5 %
Neutro Abs: 5.6 10*3/uL (ref 1.7–7.7)
Neutrophils Relative %: 68 %
PLATELETS: 289 10*3/uL (ref 150–400)
RBC: 4.12 MIL/uL (ref 3.87–5.11)
RDW: 13 % (ref 11.5–15.5)
WBC: 8.2 10*3/uL (ref 4.0–10.5)

## 2016-06-13 LAB — PREGNANCY, URINE: Preg Test, Ur: NEGATIVE

## 2016-06-13 LAB — URINE MICROSCOPIC-ADD ON
RBC / HPF: NONE SEEN RBC/hpf (ref 0–5)
WBC UA: NONE SEEN WBC/hpf (ref 0–5)

## 2016-06-13 LAB — GLUCOSE, CSF: Glucose, CSF: 73 mg/dL — ABNORMAL HIGH (ref 40–70)

## 2016-06-13 LAB — PROTEIN, CSF: Total  Protein, CSF: 17 mg/dL (ref 15–45)

## 2016-06-13 MED ORDER — OXYCODONE-ACETAMINOPHEN 5-325 MG PO TABS: 1.0000 | ORAL_TABLET | Freq: Once | ORAL | Status: DC

## 2016-06-13 MED ORDER — LIDOCAINE HCL 1 % IJ SOLN
INTRAMUSCULAR | Status: AC
  Filled 2016-06-13: qty 20

## 2016-06-13 MED ORDER — DEXAMETHASONE SODIUM PHOSPHATE 10 MG/ML IJ SOLN
10.0000 mg | Freq: Once | INTRAMUSCULAR | Status: AC
Start: 2016-06-13 — End: 2016-06-13
  Administered 2016-06-13: 10 mg via INTRAMUSCULAR
  Filled 2016-06-13: qty 1

## 2016-06-13 MED ORDER — MORPHINE SULFATE (PF) 4 MG/ML IV SOLN
4.0000 mg | Freq: Once | INTRAVENOUS | Status: AC
  Administered 2016-06-13: 4 mg via INTRAVENOUS
  Filled 2016-06-13: qty 1

## 2016-06-13 MED ORDER — LIDOCAINE-EPINEPHRINE 1 %-1:100000 IJ SOLN
10.0000 mL | Freq: Once | INTRAMUSCULAR | Status: AC
  Administered 2016-06-13: 10 mL via INTRADERMAL
  Filled 2016-06-13: qty 1

## 2016-06-13 MED ORDER — DIPHENHYDRAMINE HCL 50 MG/ML IJ SOLN
25.0000 mg | Freq: Once | INTRAMUSCULAR | Status: AC
  Administered 2016-06-13: 25 mg via INTRAVENOUS
  Filled 2016-06-13: qty 1

## 2016-06-13 MED ORDER — PROCHLORPERAZINE EDISYLATE 5 MG/ML IJ SOLN
10.0000 mg | Freq: Once | INTRAMUSCULAR | Status: AC
  Administered 2016-06-13: 10 mg via INTRAVENOUS
  Filled 2016-06-13: qty 2

## 2016-06-13 NOTE — Procedures (Signed)
LP at L4-5 OP - 30 cm H20

## 2016-06-13 NOTE — ED Notes (Signed)
Patient sent to xray

## 2016-06-13 NOTE — ED Provider Notes (Signed)
Patient signed out to me a shift change.  Patient with sudden onset headache this morning.  Pending IR LP to rule out SAH.  CSF shows 6 RBCs in tube 1 and 0 in tube 4.  5:57 PM Patient's headache has resolved.  She is tolerating POs.  DC to home.  Return precautions and instructions given regarding spinal and rebound headaches.   Roxy Horsemanobert Teressa Mcglocklin, PA-C 06/13/16 1758    Margarita Grizzleanielle Ray, MD 06/14/16 [phone]

## 2016-06-13 NOTE — ED Notes (Signed)
Patient taken to CT.

## 2016-06-13 NOTE — ED Triage Notes (Signed)
Patient comes with c/o central, frontal h/a that started this AM at 9 that radiates down her neck. Patient states she took one extra strength tylenol and she then vomited. Patient states she has bilateral blurry vision, light sensitivity, and hot flashes. Hx of migraines.

## 2016-06-13 NOTE — ED Provider Notes (Signed)
[facility]-EMERGENCY DEPT Provider Note   CSN: [id number] Arrival date & time: 06/13/16  1022     History   Chief Complaint Chief Complaint  Patient presents with  . Migraine    HPI Angela Mosley is a 30 y.o. female.  Patient is 30 yo F with PMH of anxiety and depression, no history of migraines, presenting with chief complaint of severe headache that woke her up out of sleep this morning. The pain is located in the frontal region and described as constant and throbbing. She has had headaches in the past that resolved by taking Tylenol, but this current headache is different, rated 10/10.  She had no preceding aura and went to bed completely normal last night. Also reports vomiting, photophobia, and vision changes of "black spots" starting this morning. No recent fevers, chills, or neck pain. No numbness, weakness, or difficulty ambulating. She denies any personal or family history of ICH or aneurysm. She denies any chance she could be pregnant and had tubal ligation.      Past Medical History:  Diagnosis Date  . Anxiety   . Depression   . Hyperemesis gravidarum   . PTSD (post-traumatic stress disorder)     Patient Active Problem List   Diagnosis Date Noted  . PTSD (post-traumatic stress disorder) 02/25/2013  . 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 Cannabis abuse 02/25/2013    Past Surgical History:  Procedure Laterality Date  . MOUTH SURGERY  2005  . TUBAL LIGATION      OB History    Gravida Para Term Preterm AB Living   5 5 5  0 0 5   SAB TAB Ectopic Multiple Live Births   0 0 0 0 5       Home Medications    Prior to Admission medications   Medication Sig Start Date End Date Taking? Authorizing Provider  ondansetron (ZOFRAN) 4 MG tablet Take 1 tablet (4 mg total) by mouth every 8 (eight) hours as needed for nausea or vomiting. 11/15/15   Wilmer FloorLisa A Leftwich-Kirby, CNM  valACYclovir (VALTREX) 1000 MG tablet Take  1 tablet (1,000 mg total) by mouth 2 (two) times daily. For recurrent outbreak take 1,000 mg daily for 5 days. 11/10/15   Duane LopeJennifer I Rasch, NP  valACYclovir (VALTREX) 1000 MG tablet Take 1 tablet (1,000 mg total) by mouth daily. 11/15/15   Hurshel PartyLisa A Leftwich-Kirby, CNM    Family History History reviewed. No pertinent family history.  Social History Social History  Substance Use Topics  . Smoking status: Current Every Day Smoker    Packs/day: 0.50    Years: 10.00  . Smokeless tobacco: Never Used  . Alcohol use No     Allergies   Hydrocodone; Latex; and Tape   Review of Systems Review of Systems  Constitutional: Negative for chills and fever.  HENT: Negative for ear pain and sore throat.   Eyes: Positive for photophobia and visual disturbance.  Respiratory: Negative for cough and shortness of breath.   Cardiovascular: Negative for chest pain, palpitations and leg swelling.  Gastrointestinal: Negative for abdominal pain, blood in stool, nausea and vomiting.  Genitourinary: Negative for dysuria, flank pain and hematuria.  Musculoskeletal: Negative for back pain, neck pain and neck stiffness.  Skin: Negative for color change and rash.  Neurological: Positive for headaches. Negative for dizziness, seizures, syncope, weakness and numbness.     Physical Exam Updated Vital Signs BP 111/86   Pulse 75   Temp 98 F (  36.7 C) (Oral)   Resp 18   Ht 5\' 2"  (1.575 m)   Wt 97.1 kg   SpO2 100%   BMI 39.14 kg/m   Physical Exam  Constitutional: She is oriented to person, place, and time. No distress.  Appears uncomfortable, lying in bed with eyes covered.  HENT:  Head: Normocephalic and atraumatic.  Mouth/Throat: Oropharynx is clear and moist.  Eyes: Conjunctivae and EOM are normal. Pupils are equal, round, and reactive to light.  Neck: Normal range of motion. Neck supple.  No midline cervical tenderness, crepitus, or deformity. No TTP of paraspinal or trapezius musculature. No  nuchal rigidity or meningeal signs.  Cardiovascular: Normal rate, regular rhythm, normal heart sounds and intact distal pulses.   Pulmonary/Chest: Effort normal and breath sounds normal. No respiratory distress.  Abdominal: Soft. There is no tenderness.  Musculoskeletal: Normal range of motion. She exhibits no edema or tenderness.  Neurological: She is alert and oriented to person, place, and time.  Speech is clear and goal oriented, follows commands. Cranial nerves III - XII without deficit, no facial droop. Normal strength in upper and lower extremities bilaterally, strong and equal grip strength. Sensation normal to light and sharp touch. Moves extremities without ataxia, coordination intact. Normal finger to nose and rapid alternating movements. Neg Romberg, no pronator drift. Normal gait.  Skin: Skin is warm and dry.  Psychiatric: She has a normal mood and affect.  Nursing note and vitals reviewed.    ED Treatments / Results  Labs (all labs ordered are listed, but only abnormal results are displayed) Labs Reviewed  COMPREHENSIVE METABOLIC PANEL  CBC WITH DIFFERENTIAL/PLATELET  URINALYSIS, ROUTINE W REFLEX MICROSCOPIC (NOT AT [facility])  PREGNANCY, URINE    EKG  EKG Interpretation None       Radiology No results found.  Procedures Procedures (including critical care time)  Medications Ordered in ED Medications  prochlorperazine (COMPAZINE) injection 10 mg (10 mg Intravenous Given 06/13/16 1135)  diphenhydrAMINE (BENADRYL) injection 25 mg (25 mg Intravenous Given 06/13/16 1136)  dexamethasone (DECADRON) injection 10 mg (10 mg Intramuscular Given 06/13/16 1135)  lidocaine-EPINEPHrine (XYLOCAINE W/EPI) 1 %-1:100000 (with pres) injection 10 mL (10 mLs Intradermal Given 06/13/16 1336)  morphine 4 MG/ML injection 4 mg (4 mg Intravenous Given 06/13/16 1335)  morphine 4 MG/ML injection 4 mg (4 mg Intravenous Given 06/13/16 1524)     Initial Impression / Assessment and  Plan / ED Course  I have reviewed the triage vital signs and the nursing notes.  Pertinent labs & imaging results that were available during my care of the patient were reviewed by me and considered in my medical decision making (see chart for details).  Clinical Course    Patient is 30 yo F presenting with chief complaint of severe headache that woke her up out of sleep this morning. Also reports vomiting, photophobia, and vision changes. Normal neuro exam, but CT head ordered due to severity of headache. Migraine cocktail ordered with minimal improvement. CT head showed no acute pathology, but given concern for SAH, LP performed. IV morphine ordered for pain control prior to LP. LP performed by attending Dr. Margarita Grizzleanielle Ray, but after numerous attempts complicated by patient's body habitus, orders placed for fluoroscopy guided LP. Additional dose of morphine ordered for pain control, and patient is stable waiting for LP. Oncoming PA, Ivar DrapeRob Browning, assumed care at shift change and agreeable to admit patient or dispo home pending results of LP.  Final Clinical Impressions(s) / ED Diagnoses   Final  diagnoses:  Nonintractable headache, unspecified chronicity pattern, unspecified headache type    New Prescriptions New Prescriptions   No medications on file     Jari Pigg II, [place] 06/13/16 1939    Margarita Grizzle, MD 06/14/16 [id number]

## 2016-06-17 LAB — CSF CULTURE W GRAM STAIN

## 2016-06-17 LAB — CSF CULTURE
CULTURE: NO GROWTH
GRAM STAIN: NONE SEEN

## 2016-08-20 ENCOUNTER — Encounter: Payer: Self-pay | Admitting: Family Medicine

## 2016-08-20 ENCOUNTER — Ambulatory Visit (INDEPENDENT_AMBULATORY_CARE_PROVIDER_SITE_OTHER): Payer: Medicaid Other | Admitting: Family Medicine

## 2016-08-20 VITALS — BP 116/68 | HR 80 | Temp 98.6°F | Ht 63.5 in | Wt 204.4 lb

## 2016-08-20 DIAGNOSIS — Z0000 Encounter for general adult medical examination without abnormal findings: Secondary | ICD-10-CM | POA: Diagnosis not present

## 2016-08-20 DIAGNOSIS — F332 Major depressive disorder, recurrent severe without psychotic features: Secondary | ICD-10-CM

## 2016-08-20 DIAGNOSIS — B009 Herpesviral infection, unspecified: Secondary | ICD-10-CM | POA: Diagnosis not present

## 2016-08-20 DIAGNOSIS — Z23 Encounter for immunization: Secondary | ICD-10-CM | POA: Diagnosis not present

## 2016-08-20 LAB — POCT URINALYSIS DIPSTICK
GLUCOSE UA: NEGATIVE
KETONES UA: 40
LEUKOCYTES UA: NEGATIVE
NITRITE UA: NEGATIVE
PH UA: 6
Spec Grav, UA: >=1.03
Urobilinogen, UA: 1

## 2016-08-20 LAB — POCT UA - MICROSCOPIC ONLY

## 2016-08-20 MED ORDER — VALACYCLOVIR HCL 1 G PO TABS: 1000.0000 mg | ORAL_TABLET | Freq: Every day | ORAL | 5 refills | Status: DC

## 2016-08-20 NOTE — Patient Instructions (Signed)
You were seen in clinic today to establish care with me as your PCP.  We reviewed your medical history and I refilled your Valtrex.  Please take it as prescribed for recurrences.  Also, please make a follow up appointment this upcoming week to address your depression and anxiety.  Thank you for allowing me to participate in your care!  Freddrick MarchYashika Taaj Hurlbut, MD

## 2016-08-20 NOTE — Progress Notes (Signed)
Subjective:   Patient ID: Angela Mosley    DOB: May 03, 1986, 31 y.o. female   MRN: [id number]  CC: establish care  HPI: Angela Mosley is a 31 y.o. female who presents to clinic today to establish care with me as her PCP.  Problems discussed today are as follows:   Angela Mosley goes by Angela Mosley and moved to [place] from [place], [place] in 2013.  She has 5 children (3 daughters and 2 sons).  Has had a tubal ligation after having her kids.  Takes care of her mother.  States that she has not seen a doctor since she was seen at [facility] April 2017 for an outbreak and treated for herpes. She was prescribed Acyclovir at the time and takes 1000 mg (1 tab) for 5 days for recurrent outbreaks.  Would like a refill for Acyclovir today.    Patient states she recently became homeless on Monday.  Has been staying at the [facility] on [address] with her children.  She reports she also has a history of severe depression, anxiety and PTSD.  In August of 2015 she had attempted suicide and was feeling very hopeless.  Took Tylenol and cough medicine in an attempt to overdose.  Was seen at [facility] and admitted to [facility] for about 2 weeks.  She was put on Zyprexa which makes her feel "like a zombie".  She denies SI/HI at this time and has no plan in place for suicide.  She denies feelings of hopelessness and despair.  She states her mom also suffers from depression and anxiety.   ROS: Denies recent illnesses, fevers, chills, chest pain, shortness of breath.  Denies SI/HI, auditory or visual hallucinations.  Denies feelings of hopelessness or wanting to hurt herself or anybody around her.   PMH:  Depression, anxiety, PTSD Surgical Hx: BTL 2012, oral surgery in 2005 for teeth removal (had 23 accessory teeth that were removed).  FHx: Depression, anxiety in mom.   Social Hx: +THC, does not drink alcohol, does not smoke cigarettes  Meds: Valtrex   Health Maintenance: Pap 2017 -  normal results  Declines flu shot at this time.  Tdap  Allergies: Latex, hydrocodone, adhesive tape (can only use tegaderm and paper tape).   Objective:   BP 116/68 (BP Location: Right Arm, Patient Position: Sitting, Cuff Size: Large)   Pulse 80   Temp 98.6 F (37 C) (Oral)   Ht 5' 3.5" (1.613 m)   Wt 92.7 kg (204 lb 6.4 oz)   LMP 07/26/2016 (Exact Date)   SpO2 99%   BMI 35.64 kg/m  Vitals and nursing note reviewed.   General: obese, well nourished, well developed female, in NAD HEENT: NCAT, MMM, EOMI, no rhinorrhea  Neck: supple, non-tender without LAD CV: RRR no MRG  Lungs: Clear to auscultation bilaterally, normal work of breathing Abdomen: soft, non-tender, no masses or organomegaly palpable, +bs  Skin: warm, dry, no rashes or lesions, cap refill < 2 seconds Extremities: warm and well perfused, normal tone Psych: mood appropriate, pleasant   Assessment & Plan:   31 yo F presents to clinic to establish care with me as her PCP.    HSV-2 (herpes simplex virus 2) infection Stable.  No active lesions at this time but patient reports she breaks out and is out of Acyclovir.  Refilled at this visit.   -To take 1000 mg (1 tab) for 5 days for recurrent outbreaks.  -Will continue to monitor    MDD (major depressive  disorder), recurrent severe, without psychosis Stable.  Discussed prior history this visit and patient denies SI/HI.   -Would like to address this appropriately at next visit.  -Advised patient to make a follow up appointment to re-visit this as she states she has recently become homeless earlier this week and has several contributing stressors.     Orders Placed This Encounter  Procedures  . Tdap vaccine greater than or equal to 7yo IM  . POCT urinalysis dipstick  . POCT UA - Microscopic Only   Meds ordered this encounter  Medications  . valACYclovir (VALTREX) 1000 MG tablet    Sig: Take 1 tablet (1,000 mg total) by mouth daily.    Dispense:  5 tablet     Refill:  5   Follow up: 1 week to address depression   Freddrick MarchYashika Liliana Dang, MD [facility], PGY-1 08/21/2016 4:29 PM

## 2016-08-21 NOTE — Assessment & Plan Note (Signed)
Stable.  No active lesions at this time but patient reports she breaks out and is out of Acyclovir.  Refilled at this visit.   -To take 1000 mg (1 tab) for 5 days for recurrent outbreaks.  -Will continue to monitor

## 2016-08-21 NOTE — Assessment & Plan Note (Signed)
Stable.  Discussed prior history this visit and patient denies SI/HI.   -Would like to address this appropriately at next visit.  -Advised patient to make a follow up appointment to re-visit this as she states she has recently become homeless earlier this week and has several contributing stressors.

## 2016-08-28 ENCOUNTER — Encounter: Payer: Self-pay | Admitting: Family Medicine

## 2016-08-28 ENCOUNTER — Ambulatory Visit (INDEPENDENT_AMBULATORY_CARE_PROVIDER_SITE_OTHER): Payer: Medicaid Other | Admitting: Family Medicine

## 2016-08-28 ENCOUNTER — Encounter: Payer: Self-pay | Admitting: Psychology

## 2016-08-28 VITALS — BP 122/90 | HR 90 | Temp 99.0°F | Ht 64.0 in | Wt 203.0 lb

## 2016-08-28 DIAGNOSIS — F332 Major depressive disorder, recurrent severe without psychotic features: Secondary | ICD-10-CM

## 2016-08-28 DIAGNOSIS — F1210 Cannabis abuse, uncomplicated: Secondary | ICD-10-CM

## 2016-08-28 NOTE — Assessment & Plan Note (Signed)
Denies use over the last two weeks.  Would like to stay quit.
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
## 2016-08-28 NOTE — Patient Instructions (Signed)
Dr. Pascal LuxKane will call you with a time to be seen in the Mood Clinic on 09/16/16.  It will likely be late morning.  Addendum:  09/16/16 at 10:00.  Called patient.      Until then, if you have trouble managing thoughts about hurting yourself, you can call 911 or go to [facility].  Keeping yourself is the most important thing right now.  I expect you will start to feel better.  The CALM app might help you with your breathing when feeling stressed.  It has several tools as well
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
## 2016-08-28 NOTE — Patient Instructions (Signed)
Dr. Pascal LuxKane will call you with a time to be seen in the Mood Clinic on 09/16/16.  It will likely be late morning.    Until then, if you have trouble managing thoughts about hurting yourself, you can call 911 or go to [facility].  Keeping yourself is the most important thing right now.  I expect you will start to feel better.  The CALM app might help you with your breathing when feeling stressed.  It has several tools as well.
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
## 2016-09-16 ENCOUNTER — Ambulatory Visit (INDEPENDENT_AMBULATORY_CARE_PROVIDER_SITE_OTHER): Payer: Medicaid Other | Admitting: Psychology

## 2016-09-16 DIAGNOSIS — F4310 Post-traumatic stress disorder, unspecified: Secondary | ICD-10-CM

## 2016-09-16 DIAGNOSIS — F332 Major depressive disorder, recurrent severe without psychotic features: Secondary | ICD-10-CM | POA: Diagnosis not present

## 2016-09-16 NOTE — Progress Notes (Signed)
Reason for follow-up:  Patient seen for warm hand-off and I recommended [facility]DC to get a better sense of diagnosis and to look at medication options.     Issues discussed:   Angela Mosley moved out of the hotel into an apartment with her children and mother.  It is not a place where Angela Mosley wants to live - doesn't feel particularly safe.  Dirty.  Roaches.  This is upsetting but Angela Mosley doesn't want to feel ungrateful.    Angela Mosley does report periods of energy lasting about three days where Angela Mosley has increased goal directed activity (doing hair for instance), talking more than usual, accelerated speech, increased spending.  Spending though is $17 when Angela Mosley should have only spent $10.  Also notes increased sexual energy.    After a period of energy, Angela Mosley typically notes a depressed mood lasting five days or more where Angela Mosley tends to eat things Angela Mosley shouldn't, has difficulty getting out of bed, motivating but does still secondary to a need to take care of her children.  Also notes being "triggered" into bad moods.  Things that bother her mother minimally "set [her] off" and Angela Mosley curses, yells, slams things for hours on end.  This feels good in the moment.    More thorough evaluation was done on 08/28/16
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
## 2016-09-16 NOTE — Patient Instructions (Addendum)
Please schedule a follow-up for 3/21 at 9:30.  We will be thinking about medication options.  You continue to think about medicine and what role it might play.  We will discuss further next appointment.    Two options for therapy include [facility]: [phone].  The other is to look at Psychology Today "Find a Tourist information centre managerTherapist" search engine.  You can put in [place] and your zip code and find therapists nearby.  I will also look to see if there are any I would recommend.    [email or web address].org is a great website to explore.

## 2016-09-17 NOTE — Assessment & Plan Note (Signed)
Patient was initially guarded - not hostile but had difficulty generating answers and collaborating during the encounter.  Family history and report of symptoms today could be consistent with Bipolar 2 disorder.  She says her mom has been diagnosed but also says her mom take Paxil.  Unclear how much PTSD, temperament, and learned behaviors are playing into her report of irritability and rage behaviors.  She does report periods of about three days where her energy level is abnormally high with symptoms consistent with hypomania (racing thoughts, increase in goal directed activity, increased libido).  Working diagnosis is Bipolar 2 disorder with the caveat that this clinical picture is psychosocially complex.  PHQ-9 #9 was 2.  She has had a prior suicide attempt in 2015.  She denied active suicidality today and stated her children and mother were barriers.  She feels confident that if her thoughts took a turn for the worse, her mother would intervene and call 911.    Treatment team and patient agreed to a more measured approach.  She will research bipolar disorder and medications on the [facility] website and Dr. Kathrynn RunningManning and I will discuss medication options.  She is also interested in therapy.  Provided info on [facility] and also Psychology Today Find a Radiation protection practitionerTherapist website.    Finally - discussed the role of medication in helping change behavior.  She voiced an understanding that medication does not mean she will stop reacting with rage to real and perceived slights.  This is ultimately her responsibility but that the medication could help make this an easier choice.    See patient instructions for further plan.

## 2016-09-21 ENCOUNTER — Telehealth: Payer: Self-pay | Admitting: Psychology

## 2016-09-21 NOTE — Telephone Encounter (Signed)
Considered therapy options a bit more and thought that a therapist who provides Dialectical Behavior Therapy might be an especially good match.  [facility] does a good job with this.  Called patient and recommended she call [facility] and ask for a therapist that could do that.  She had not yet called either of the recommendations but stated she was still interested.  Will follow as scheduled.

## 2016-10-14 ENCOUNTER — Ambulatory Visit: Payer: Medicaid Other | Admitting: Psychology

## 2016-10-14 ENCOUNTER — Telehealth: Payer: Self-pay | Admitting: Psychology

## 2016-10-14 NOTE — Telephone Encounter (Signed)
Patient left a VM at 9:27 this morning related to her 9:30 appointment.  VM was cut off.  She did not show for her appointment.  I called her back and left a VM.

## 2016-11-16 ENCOUNTER — Telehealth: Payer: Self-pay | Admitting: Family Medicine

## 2016-11-16 NOTE — Telephone Encounter (Signed)
Pt had labs done back in January and is very upset she never heard back and is still having symptoms. ep

## 2016-11-17 ENCOUNTER — Ambulatory Visit (INDEPENDENT_AMBULATORY_CARE_PROVIDER_SITE_OTHER): Payer: Medicaid Other | Admitting: Student

## 2016-11-17 ENCOUNTER — Encounter: Payer: Self-pay | Admitting: Student

## 2016-11-17 VITALS — BP 122/76 | HR 85 | Temp 99.0°F | Wt 214.6 lb

## 2016-11-17 DIAGNOSIS — R300 Dysuria: Secondary | ICD-10-CM | POA: Diagnosis not present

## 2016-11-17 DIAGNOSIS — N898 Other specified noninflammatory disorders of vagina: Secondary | ICD-10-CM | POA: Insufficient documentation

## 2016-11-17 LAB — POCT URINALYSIS DIP (MANUAL ENTRY)
BILIRUBIN UA: NEGATIVE
BILIRUBIN UA: NEGATIVE mg/dL
Blood, UA: NEGATIVE
GLUCOSE UA: NEGATIVE mg/dL
LEUKOCYTES UA: NEGATIVE
Nitrite, UA: NEGATIVE
PROTEIN UA: NEGATIVE mg/dL
Spec Grav, UA: 1.02 (ref 1.010–1.025)
Urobilinogen, UA: 0.2 E.U./dL
pH, UA: 8.5 — AB (ref 5.0–8.0)

## 2016-11-17 LAB — POCT WET PREP (WET MOUNT): Clue Cells Wet Prep Whiff POC: POSITIVE

## 2016-11-17 MED ORDER — METRONIDAZOLE 500 MG PO TABS: 2000.0000 mg | ORAL_TABLET | Freq: Three times a day (TID) | ORAL | 0 refills | Status: DC

## 2016-11-17 MED ORDER — METRONIDAZOLE 500 MG PO TABS: 500.0000 mg | ORAL_TABLET | Freq: Two times a day (BID) | ORAL | 0 refills | Status: DC

## 2016-11-17 MED ORDER — FLUCONAZOLE 150 MG PO TABS: 150.0000 mg | ORAL_TABLET | Freq: Once | ORAL | 0 refills | Status: DC

## 2016-11-17 NOTE — Patient Instructions (Signed)
Follow up as needed You will be called about your results If you have questions or concerns, call the office at [phone]

## 2016-11-17 NOTE — Progress Notes (Signed)
   Subjective:    Patient ID: Angela Mosley, female    DOB: 12/15/1985, 31 y.o.   MRN: [id number]   CC: concern for yeast infection and UTI  HPI:  31 y/o F presents for concern for yeast infection and concern for UTI   Concern for UTI - has had low back pain with increased urinary frequency for the last week - had some dysuria but that has resolved - she feels she always has low back pain when she has a UTI - no fevers  Yeast infection - she has had increased vaginal discharge with itching for the last two weeks - no vaginal pain, no abdominal pain - she has not noted a foul odor - she is sexually active with one female partner   Smoking status reviewed  Review of Systems  Per HPI, else denies chest pain, shortness of breath,     Objective:  BP 122/76   Pulse 85   Temp 99 F (37.2 C) (Oral)   Wt 214 lb 9.6 oz (97.3 kg)   LMP 10/27/2016 (Exact Date)   SpO2 99%   BMI 36.84 kg/m  Vitals and nursing note reviewed  General: NAD Cardiac: RRR, Respiratory: CTAB, normal effort Skin: warm and dry, no rashes noted Neuro: alert and oriented, no focal deficits MSK: no CVA tenderness, non tender back to palpation Pelvic: Normal EGBUS, normal vaginal canal with moderate thick white discharge, normal cervix with no CMT, normal mobile uterus, normal adnexa with no masses, no adnexal tenderness     Assessment & Plan:    Vaginal discharge Vaginal discharge with normal vitals and benign exam. No clinical evidence of PID - Wet prep shows BV and trichomonas. Flagyl called into pharmacy for bother trich and BV.  - patient counseled to abstain until 7 days after treatment and to tell her partner to best tested and treated as needed - she will need GC/CT testing at next visit  Concern for UTI - UA negative, will follow   Alyssa A. Kennon Rounds MD, MS Family Medicine Resident PGY-3 Pager [phone]

## 2016-11-17 NOTE — Assessment & Plan Note (Signed)
Vaginal discharge with normal vitals and benign exam. No clinical evidence of PID - Wet prep shows BV and trichomonas. Flagyl called into pharmacy for bother trich and BV.  - patient counseled to abstain until 7 days after treatment and to tell her partner to best tested and treated as needed - she will need GC/CT testing at next visit

## 2016-11-17 NOTE — Telephone Encounter (Signed)
Saw that she had an appointment with Dr. Kennon Rounds today in clinic and was treated for her symptoms.  Will call patient to discuss.

## 2016-11-20 ENCOUNTER — Ambulatory Visit (INDEPENDENT_AMBULATORY_CARE_PROVIDER_SITE_OTHER): Payer: Medicaid Other | Admitting: Family Medicine

## 2016-11-20 ENCOUNTER — Other Ambulatory Visit (HOSPITAL_COMMUNITY)
Admission: RE | Admit: 2016-11-20 | Discharge: 2016-11-20 | Disposition: A | Discharge status: Home / Self Care | Payer: Medicaid Other | Source: Ambulatory Visit | Attending: Family Medicine | Admitting: Family Medicine

## 2016-11-20 ENCOUNTER — Encounter: Payer: Self-pay | Admitting: Family Medicine

## 2016-11-20 VITALS — BP 122/72 | HR 73 | Temp 98.5°F | Ht 64.0 in | Wt 210.4 lb

## 2016-11-20 DIAGNOSIS — Z202 Contact with and (suspected) exposure to infections with a predominantly sexual mode of transmission: Secondary | ICD-10-CM

## 2016-11-20 DIAGNOSIS — N898 Other specified noninflammatory disorders of vagina: Secondary | ICD-10-CM | POA: Diagnosis not present

## 2016-11-20 MED ORDER — PANTOPRAZOLE SODIUM 20 MG PO TBEC: 20.0000 mg | DELAYED_RELEASE_TABLET | Freq: Every day | ORAL | 2 refills | Status: DC

## 2016-11-20 NOTE — Patient Instructions (Addendum)
It was nice seeing you again today! You were seen in clinic for follow up on your vaginal discharge and we tested you for gonorrhea and chlamydia today in addition to your testing from 4/24.  Please continue to take the metronidazole as prescribed for the full course and I expect your symptoms will improve with that.  In the meantime, I will let you know what the results of today's tests were and can call in your medication to the pharmacy if needed.   Additionally, I have sent Protonix to your pharmacy for your reflux.  You can follow up in about 4 weeks time if needed.   Be well,  [name], MD

## 2016-11-20 NOTE — Progress Notes (Signed)
   Subjective:   Patient ID: Angela Mosley    DOB: [birth year], 31 y.o. female   MRN: [id number]  CC: follow up from 4/24   HPI: Angela Mosley is a 31 y.o. female who presents to clinic today for vaginal discharge.  Reports her vaginal discharge is still present. Is actually having more than before. Denies burning on urination but feels like her  pee is now dark yellow.  Admits she has not been drinking enough water either as she just doesn't like the taste of it.  Notes that she feels as though she has cuts when she wipes after urination.   Patient was recently seen on 4/24 for discharge by Dr. Kennon Rounds and prescribed Flagyl for Trichomonas and BV found on wet prep. She has been taking this as directed and is on day 3/7 of treatment.  She reports her partner was seen at [facility] and was not tested for Trichomonas but was given 4 pills of Metronidazole in the office as well, however he got hives from it.     ROS: Denies fevers, chills, nausea, vomiting, diarrhea.  Denies burning on urination, frequency, urgency.   PMFSH: Pertinent past medical, surgical, family, and social history were reviewed and updated as appropriate. Smoking status reviewed.  Medications reviewed.  Objective:   BP 122/72   Pulse 73   Temp 98.5 F (36.9 C) (Oral)   Ht  (1.626 m)   Wt 210 lb 6.4 oz (95.4 kg)   LMP 10/27/2016 (Exact Date)   SpO2 98%   BMI 36.12 kg/m  Vitals and nursing note reviewed.  General: pleasant 31 yo F, NAD  CV: regular rate and rhythm without murmurs rubs or gallops Lungs: clear to auscultation bilaterally with normal work of breathing Abdomen: soft, non-tender, no masses or organomegaly palpable, normoactive bowel sounds Pelvic: normal external female genitalia, no CMT noted on bimanual exam, scant white thick discharge noted on speculum exam, no erythema or bleeding Skin: warm, dry  Assessment & Plan:   Vaginal discharge Wet prep from 4/24 +for BV and trichomonas.   Patient prescribed Flagyl at that time and is on day 3/7.  Partner has also received treatment. UA at that time was negative for infection and patient with no dysuria.   Normal vaginal exam with no CMT or red flags, however thick white discharge noted on speculum exam.  -Advised to abstain from sex x7days after treatment   -GC Chlamydia tested today -Follow up:  4 weeks   Meds ordered this encounter  Medications  . pantoprazole (PROTONIX) 20 MG tablet    Sig: Take 1 tablet (20 mg total) by mouth daily.    Dispense:  30 tablet    Refill:  2    Angela March, MD [facility], PGY-1 11/24/2016 6:24 PM

## 2016-11-23 LAB — CERVICOVAGINAL ANCILLARY ONLY
CHLAMYDIA, DNA PROBE: NEGATIVE
NEISSERIA GONORRHEA: NEGATIVE

## 2016-11-24 NOTE — Assessment & Plan Note (Signed)
Wet prep from 4/24 +for BV and trichomonas.  Patient prescribed Flagyl at that time and is on day 3/7.  Partner has also received treatment. UA at that time was negative for infection and patient with no dysuria.   Normal vaginal exam with no CMT or red flags, however thick white discharge noted on speculum exam.  -Advised to abstain from sex x7days after treatment   -GC Chlamydia tested today -Follow up:  4 weeks

## 2016-12-02 NOTE — Telephone Encounter (Signed)
Called patient to inform her that her GC chlamydia was negative.  She notes she no longer has any vaginal discharge and will make an appointment with our clinic if she has any further symptoms.  All questions were answered and patient appreciative of the call.

## 2017-01-12 ENCOUNTER — Ambulatory Visit (INDEPENDENT_AMBULATORY_CARE_PROVIDER_SITE_OTHER): Payer: Medicaid Other | Admitting: Family Medicine

## 2017-01-12 ENCOUNTER — Encounter: Payer: Self-pay | Admitting: Family Medicine

## 2017-01-12 VITALS — BP 110/80 | HR 78 | Temp 98.6°F | Ht 64.0 in | Wt 202.0 lb

## 2017-01-12 DIAGNOSIS — K2900 Acute gastritis without bleeding: Secondary | ICD-10-CM | POA: Diagnosis not present

## 2017-01-12 MED ORDER — PANTOPRAZOLE SODIUM 20 MG PO TBEC: 20.0000 mg | DELAYED_RELEASE_TABLET | Freq: Every day | ORAL | 2 refills | Status: DC

## 2017-01-12 MED ORDER — ONDANSETRON HCL 4 MG PO TABS: 4.0000 mg | ORAL_TABLET | Freq: Three times a day (TID) | ORAL | 0 refills | Status: DC | PRN

## 2017-01-12 MED ORDER — SUCRALFATE 1 G PO TABS: 1.0000 g | ORAL_TABLET | Freq: Three times a day (TID) | ORAL | 0 refills | Status: DC

## 2017-01-12 NOTE — Progress Notes (Signed)
   Subjective:   Patient ID: Angela Mosley    DOB: 05/01/1986, 31 y.o. female   MRN: [id number]  CC: "Abdominal pain"  HPI: Angela FredricksonJeronae Beckles is a 31 y.o. female who presents to clinic today for abdominal pain. Problems discussed today are as follows:  Abdominal pain: Onset 3 days ago after wetting at which point patient drank excessive amounts of alcohol. Patient also endorses smoking weed but denies other substance use. Patient says abdominal pain is located in epigastric region and was 10/10 in intensity with burning sensation. Patient endorses episodes of emesis associated with pain and subsequent diarrhea the next morning. Patient currently on Protonix 20 mg daily for GERD but did not increase dose. Was [place] and [place] what it was and underwent ultrasound of gallbladder and appendix. Patient states her tubes are tied in there are no chance of pregnancy. ROS: Denies fevers or chills, hematemesis, melena or hematochezia, change in weight.  Complete ROS performed, see HPI for pertinent.  PMFSH: GERD, genital herpes, PTSD, MDD, cannabis abuse. No history of abdominal surgery. Mother and father without medical history. Smoking status reviewed. Medications reviewed.  Objective:   BP 110/80   Pulse 78   Temp 98.6 F (37 C) (Oral)   Ht 5\' 4"  (1.626 m)   Wt 202 lb (91.6 kg)   SpO2 97%   BMI 34.67 kg/m  Vitals and nursing note reviewed.  General: well nourished, well developed, in no acute distress with non-toxic appearance HEENT: normocephalic, atraumatic, moist mucous membranes Neck: supple, non-tender without lymphadenopathy CV: regular rate and rhythm without murmurs, rubs, or gallops, no lower extremity edema Lungs: clear to auscultation bilaterally with normal work of breathing Abdomen: soft, moderate tenderness at epigastric region, non-distended, no masses or organomegaly palpable, normoactive bowel sounds Skin: warm, dry, no rashes or lesions, cap refill  < 2 seconds Extremities: warm and well perfused, normal tone  Assessment & Plan:   Acute gastritis without bleeding Acute. History of GERD on PPI. Likely attributed to excessive EtOH use. No signs of acute abdomen. --Instructed patient to increase Protonix to 40 mg daily until resolved --Given prescription for Zofran 4 mg every 8 hours as needed and Carafate --Patient instructed to take Tums OTC in addition to PPI --Will consider repeat US if pain persists --RTC in 2 weeks, discussed red flags reasons for emergent medical care  No orders of the defined types were placed in this encounter.  Meds ordered this encounter  Medications  . ondansetron (ZOFRAN) 4 MG tablet    Sig: Take 1 tablet (4 mg total) by mouth every 8 (eight) hours as needed for nausea or vomiting.    Dispense:  20 tablet    Refill:  0  . sucralfate (CARAFATE) 1 g tablet    Sig: Take 1 tablet (1 g total) by mouth 4 (four) times daily -  with meals and at bedtime.    Dispense:  20 tablet    Refill:  0  . pantoprazole (PROTONIX) 20 MG tablet    Sig: Take 1 tablet (20 mg total) by mouth daily.    Dispense:  30 tablet    Refill:  2    Durward Parcelavid Ladon Vandenberghe, DO [facility], PGY-1 01/12/2017 12:26 PM

## 2017-01-12 NOTE — Assessment & Plan Note (Signed)
Acute. History of GERD on PPI. Likely attributed to excessive EtOH use. No signs of acute abdomen. --Instructed patient to increase Protonix to 40 mg daily until resolved --Given prescription for Zofran 4 mg every 8 hours as needed and Carafate --Patient instructed to take Tums OTC in addition to PPI --Will consider repeat US if pain persists --RTC in 2 weeks, discussed red flags reasons for emergent medical care

## 2017-01-12 NOTE — Patient Instructions (Signed)
Thank you for coming in to see us today. Please see below to review our plan for today's visit.  1. Please double up on your Protonix to 40 mg daily. Can also takes over-the-counter Tums in addition to this medication. I have also given you a prescription for Carafate which she can take 3 times daily. Take Zofran every 8 hours as needed for nausea. Continue to eat a bland diet and avoid foods and sepsis and 70 her to the stomach. These include: alcohol, chocolate, caffeine, excessive carbohydrates, NSAIDs. 2. If pain does not improve within 2 weeks, return to the clinic. If your pain worsens or he began to have intractable vomiting, seek emergent medical care.  Please call the clinic at [phone] if your symptoms worsen or you have any concerns. It was my pleasure to see you. -- Durward Parcelavid McMullen, DO [facility], PGY-1   Gastritis, Adult Gastritis is inflammation of the stomach. There are two kinds of gastritis:  Acute gastritis. This kind develops suddenly.  Chronic gastritis. This kind lasts for a long time.  Gastritis happens when the lining of the stomach becomes weak or gets damaged. Without treatment, gastritis can lead to stomach bleeding and ulcers. What are the causes? This condition may be caused by:  An infection.  Drinking too much alcohol.  Certain medicines.  Having too much acid in the stomach.  A disease of the intestines or stomach.  Stress.  What are the signs or symptoms? Symptoms of this condition include:  Pain or a burning in the upper abdomen.  Nausea.  Vomiting.  An uncomfortable feeling of fullness after eating.  In some cases, there are no symptoms. How is this diagnosed? This condition may be diagnosed with:  A description of your symptoms.  A physical exam.  Tests. These can include: ? Blood tests. ? Stool tests. ? A test in which a thin, flexible instrument with a light and camera on the end is passed down the  esophagus and into the stomach (upper endoscopy). ? A test in which a sample of tissue is taken for testing (biopsy).  How is this treated? This condition may be treated with medicines. If the condition is caused by a bacterial infection, you may be given antibiotic medicines. If it is caused by too much acid in the stomach, you may get medicines called H2 blockers, proton pump inhibitors, or antacids. Treatment may also involve stopping the use of certain medicines, such as aspirin, ibuprofen, or other nonsteroidal anti-inflammatory drugs (NSAIDs). Follow these instructions at home:  Take over-the-counter and prescription medicines only as told by your health care provider.  If you were prescribed an antibiotic, take it as told by your health care provider. Do not stop taking the antibiotic even if you start to feel better.  Drink enough fluid to keep your urine clear or pale yellow.  Eat small, frequent meals instead of large meals. Contact a health care provider if:  Your symptoms get worse.  Your symptoms return after treatment. Get help right away if:  You vomit blood or material that looks like coffee grounds.  You have black or dark red stools.  You are unable to keep fluids down.  Your abdominal pain gets worse.  You have a fever.  You do not feel better after 1 week. This information is not intended to replace advice given to you by your health care provider. Make sure you discuss any questions you have with your health care provider.  Document Released: 07/07/2001 Document Revised: 03/11/2016 Document Reviewed: 04/06/2015 Elsevier Interactive Patient Education  [employer].

## 2017-01-15 ENCOUNTER — Other Ambulatory Visit: Payer: Self-pay | Admitting: Family Medicine

## 2017-02-09 ENCOUNTER — Other Ambulatory Visit: Payer: Self-pay | Admitting: Family Medicine

## 2017-02-09 DIAGNOSIS — K2900 Acute gastritis without bleeding: Secondary | ICD-10-CM

## 2017-03-15 ENCOUNTER — Ambulatory Visit (INDEPENDENT_AMBULATORY_CARE_PROVIDER_SITE_OTHER): Payer: Medicaid Other | Admitting: Internal Medicine

## 2017-03-15 DIAGNOSIS — J069 Acute upper respiratory infection, unspecified: Secondary | ICD-10-CM | POA: Diagnosis present

## 2017-03-15 MED ORDER — FLUTICASONE PROPIONATE 50 MCG/ACT NA SUSP: 2.0000 | Freq: Every day | NASAL | 6 refills | Status: DC

## 2017-03-15 NOTE — Progress Notes (Signed)
   [facility] Noralee Chars, MD Phone: [phone]  Reason For Visit: SDA for URI  # 1 day of nasal congestion, headache, cough,. Patient states that she has had some chills but denies any fevers. She denies any nausea or vomiting. Patient has tried some cough drops for these cold symptoms  Past Medical History Reviewed problem list.  Medications- reviewed and updated No additions to family history Social history- patient is a nonsmoker  Objective: BP 118/70 (BP Location: Right Arm, Patient Position: Sitting, Cuff Size: Normal)   Pulse 78   Temp 98.9 F (37.2 C) (Oral)   Ht 5\' 4"  (1.626 m)   Wt 202 lb 12.8 oz (92 kg)   SpO2 97%   BMI 34.81 kg/m  Gen: NAD, alert, cooperative with exam HEENT: Tenderness along maxillary and ethmoid sinus     Neck: No masses palpated. No lymphadenopathy    Ears: Tympanic membranes intact, normal light reflex, no erythema, no bulging    Nose: nasal congestion    Throat: slight erythematous oropharynx  Cardio: regular rate and rhythm, S1S2 heard, no murmurs appreciated Pulm: clear to auscultation bilaterally, no wheezes, rhonchi or rales   Assessment/Plan: See problem based a/p  URI (upper respiratory infection) Consistent with URI  - Advised DayQuil and Nitquil - Provided script for flonase - Work note  - Follow up as needed

## 2017-03-15 NOTE — Patient Instructions (Addendum)
Take DayQuil and NyQuil. I will prescribe Flonase to help with the nasal congestion.

## 2017-03-15 NOTE — Assessment & Plan Note (Signed)
Consistent with URI  - Advised DayQuil and Nitquil - Provided script for flonase - Work note  - Follow up as needed

## 2017-04-02 ENCOUNTER — Other Ambulatory Visit: Payer: Self-pay | Admitting: Family Medicine

## 2017-04-02 DIAGNOSIS — K2900 Acute gastritis without bleeding: Secondary | ICD-10-CM

## 2017-06-07 ENCOUNTER — Ambulatory Visit (INDEPENDENT_AMBULATORY_CARE_PROVIDER_SITE_OTHER): Payer: Self-pay | Admitting: Student in an Organized Health Care Education/Training Program

## 2017-06-07 ENCOUNTER — Encounter: Payer: Self-pay | Admitting: Student in an Organized Health Care Education/Training Program

## 2017-06-07 VITALS — BP 108/64 | HR 83 | Temp 98.6°F | Ht 64.0 in | Wt 211.6 lb

## 2017-06-07 DIAGNOSIS — K219 Gastro-esophageal reflux disease without esophagitis: Secondary | ICD-10-CM | POA: Insufficient documentation

## 2017-06-07 DIAGNOSIS — R0781 Pleurodynia: Secondary | ICD-10-CM | POA: Insufficient documentation

## 2017-06-07 MED ORDER — PANTOPRAZOLE SODIUM 40 MG PO PACK: 40.0000 mg | PACK | Freq: Two times a day (BID) | ORAL | 0 refills | Status: DC

## 2017-06-07 MED ORDER — PANTOPRAZOLE SODIUM 40 MG PO TBEC: 40.0000 mg | DELAYED_RELEASE_TABLET | Freq: Every day | ORAL | 0 refills | Status: DC

## 2017-06-07 NOTE — Assessment & Plan Note (Addendum)
-   Low suspicion for PE at this time; no hypoxia, no tachycardia. Symptoms most likely go along with GERD. However given pain with inspiration on exam, will check D Dimer. - if positive, can consider LE U/S

## 2017-06-07 NOTE — Patient Instructions (Signed)
It was a pleasure seeing you today in our clinic. Today we discussed your reflux. Here is the treatment plan we have discussed and agreed upon together:  We drew blood work at today's visit. I will call or send you a letter with these results. If you do not hear from me within the next week, please give our office a call.  A consult was placed to Gastroenterology at today's visit.  You will receive a call to schedule an appointment. If you do not receive a call within two weeks please call our office so we can place the consult again.   Our clinic's number is [phone]. Please call with questions or concerns about what we discussed today.  Be well, Dr. Mosetta PuttFeng

## 2017-06-07 NOTE — Assessment & Plan Note (Signed)
-   Protonix 40 mg BID - GI referral given history of regurg - carafate, tums prn

## 2017-06-07 NOTE — Progress Notes (Signed)
   CC: Reflux  HPI: Angela Mosley is a 31 y.o. female with PMH significant for PTSD, gastritis, HSV-2, cannabis use who [facility] evaluation of suspected gastrointestinal reflux of 3 days duration.   Patient reports that since 3 days ago, she has had the sensation that something is stuck in her throat every time she eats. She will swallow feels like it comes back abnormal swallow again. Additionally she has been feeling very nauseous, has epigastric abdominal pain that is worse when she lies flat, and has been vomiting. She has been increasing her pantoprazole at home, taking 40 mg 3 days ago, 60 mg 2 days ago and 80 mg yesterday. She continues to have symptoms despite this.  Patient reports that in [place] she was previously diagnosed with an ulcer, however she had never been scoped. She states she has never followed with a gastroenterologist. She has not been losing weight. She is not endorsing melanotic or bloody stools.  She does endorse bilateral calf pain, pain with deep breath. No dyspnea or palpitations. No worsened pain with leaning forward. No recent travel or history of immobility. No history of clots.  Review of Symptoms:  See HPI for ROS.   CC, SH/smoking status, and VS noted.  Objective: BP 108/64   Pulse 83   Temp 98.6 F (37 C) (Oral)   Ht 5\' 4"  (1.626 m)   Wt 211 lb 9.6 oz (96 kg)   LMP 05/20/2017 (Approximate)   SpO2 98%   BMI 36.32 kg/m  GEN: NAD, alert, cooperative, and pleasant. RESPIRATORY: clear to auscultation bilaterally with no wheezes, rhonchi or rales, good effort. +complains of chest pain with deep inspiration CV: RRR, no m/r/g, no peripheral edema GI: soft, non-tender, non-distended, no hepatosplenomegaly EXT: no calf tenderness PSYCH: AAOx3, appropriate affect  Assessment and plan:  Pleuritic chest pain - Low suspicion for PE at this time; no hypoxia, no tachycardia. Symptoms most likely go along with GERD. However given  pain with inspiration on exam, will check D Dimer. - if positive, can consider LE U/S  Gastroesophageal reflux disease without esophagitis - Protonix 40 mg BID - GI referral given history of regurg - carafate, tums prn    Orders Placed This Encounter  Procedures  . D-dimer, quantitative (not at [facility])  . Ambulatory referral to Gastroenterology    Referral Priority:   Routine    Referral Type:   Consultation    Referral Reason:   Specialty Services Required    Number of Visits Requested:   1    Meds ordered this encounter  Medications  . DISCONTD: pantoprazole (PROTONIX) 40 MG tablet    Sig: Take 1 tablet (40 mg total) daily by mouth.    Dispense:  60 tablet    Refill:  0    This prescription was filled on 04/02/2017. Any refills authorized will be placed on file.  . pantoprazole sodium (PROTONIX) 40 mg/20 mL PACK     20 mLs (40 mg total) 2 (two) times daily.    Dispense:  1200 mL    Refill:  0     Howard PouchLauren Martesha Niedermeier, MD,MS,  PGY2 06/07/2017 4:18 PM

## 2017-06-08 ENCOUNTER — Telehealth: Payer: Self-pay | Admitting: *Deleted

## 2017-06-08 LAB — D-DIMER, QUANTITATIVE: D-DIMER: 0.26 mg/L FEU (ref 0.00–0.49)

## 2017-06-08 NOTE — Telephone Encounter (Signed)
Spoke to pharmacy. Gave them the information below. They have already sent out the tablets of the Protonix. If pt calls, they will send the liquid. I told them that the Carafate was not ordered. Sunday SpillersSharon T Saunders, CMA

## 2017-06-08 NOTE — Telephone Encounter (Signed)
Called back pharmacy but placed on hold for 15 minutes, unable to hold longer.  Protonix tablets were ordered however patient did not think she'd be able to keep these down, so liquid form was ordered. They can fill liquid. Carafate was not ordered.

## 2017-06-08 NOTE — Telephone Encounter (Signed)
Pharmacist states they have received 2 rx's for protonix for patient, one was the tablet and the other was a liquid. Patient states she is supposed to be getting the carafate liquid. Please contact pharmacy with clarification.

## 2017-06-09 ENCOUNTER — Encounter: Payer: Self-pay | Admitting: Gastroenterology

## 2017-06-22 ENCOUNTER — Ambulatory Visit (HOSPITAL_COMMUNITY)
Admission: RE | Admit: 2017-06-22 | Discharge: 2017-06-22 | Disposition: A | Discharge status: Home / Self Care | Payer: Medicaid Other | Source: Ambulatory Visit | Attending: Family Medicine | Admitting: Family Medicine

## 2017-06-22 ENCOUNTER — Other Ambulatory Visit: Payer: Self-pay

## 2017-06-22 ENCOUNTER — Ambulatory Visit (INDEPENDENT_AMBULATORY_CARE_PROVIDER_SITE_OTHER): Payer: Medicaid Other | Admitting: Family Medicine

## 2017-06-22 ENCOUNTER — Encounter: Payer: Self-pay | Admitting: Family Medicine

## 2017-06-22 VITALS — BP 110/72 | HR 76 | Temp 99.1°F | Ht 64.0 in | Wt 209.0 lb

## 2017-06-22 DIAGNOSIS — R1310 Dysphagia, unspecified: Secondary | ICD-10-CM

## 2017-06-22 DIAGNOSIS — K219 Gastro-esophageal reflux disease without esophagitis: Secondary | ICD-10-CM

## 2017-06-22 MED ORDER — SUCRALFATE 1 GM/10ML PO SUSP: 1.0000 g | Freq: Three times a day (TID) | ORAL | 0 refills | Status: DC

## 2017-06-22 NOTE — Progress Notes (Signed)
   Subjective:   Patient ID: Angela Mosley    DOB: Jan 17, 1986, 31 y.o. female   MRN: [id number]  CC: Reflux  HPI: Angela Mosley is a 31 y.o. female who presents to clinic today for difficulty swallowing.  Reflux Patient reports worsening symptoms of her reflux.  Additionally, she has a sensation of not being able to swallow.  She was recently seen by Dr. Mosetta PuttFeng in [facility] and referred to GI.  She has an appointment for 07/28/2017.  She reports her sucralfate tablet is too large to swallow.  She has to sit up in bed with pillows to sleep comfortably.  She denies eating acetic/citric foods.  She does not eat spicy foods.  Has not been drinking sodas or other caffeinated beverages.  Denies alcohol use.  She reports she is unable to eat as much due to the symptoms.  Denies sore throat and pain with swallowing.  Denies chest pain, shortness of breath, abdominal pain.  ROS: Denies fever, chills, nausea, vomiting.  Denies abdominal pain, pain with swallowing. PMFSH: Pertinent past medical, surgical, family, and social history were reviewed and updated as appropriate. Smoking status reviewed. Medications reviewed. Objective:   BP 110/72   Pulse 76   Temp 99.1 F (37.3 C) (Oral)   Ht 5\' 4"  (1.626 m)   Wt 209 lb (94.8 kg)   SpO2 99%   BMI 35.87 kg/m  Vitals and nursing note reviewed.  General: 31 year old female, no acute distress HEENT: NCAT, EOMI, MMM, oropharynx is clear Neck: Supple, nontender, normal range of motion CV: RRR no MRG Lungs: CTAB, nonlabored breathing Abdomen: Soft, NT ND, no epigastric tenderness, positive bowel sounds Skin: warm, dry, no rash Extremities: Brisk cap refill   Assessment & Plan:   Gastroesophageal reflux disease without esophagitis Symptoms likely consistent with GERD.  -Recommend continue Protonix 40 mg twice daily - As sucralfate tablet is difficult for her to swallow, I have changed to a liquid suspension form.  -DG barium swallow for further  evaluation   --GI appointment 07/28/2017  Orders Placed This Encounter  Procedures  . DG Esophagus    Standing Status:   Future    Number of Occurrences:   1    Standing Expiration Date:   08/22/2018    Order Specific Question:   Reason for Exam (SYMPTOM  OR DIAGNOSIS REQUIRED)    Answer:   difficulty swallowing    Order Specific Question:   Is patient pregnant?    Answer:   No    Order Specific Question:   Preferred imaging location?    Answer[facility]    Order Specific Question:   Radiology Contrast Protocol - do NOT remove file path    Answer:   [email or web address]   Meds ordered this encounter  Medications  . sucralfate (CARAFATE) 1 GM/10ML suspension    Sig: Take 10 mLs (1 g total) by mouth 4 (four) times daily -  with meals and at bedtime.    Dispense:  420 mL    Refill:  0    Freddrick MarchYashika Raniya Golembeski, MD [facility], PGY-2 06/29/2017 10:59 AM

## 2017-06-22 NOTE — Patient Instructions (Signed)
You were seen in clinic for worsening symptoms of reflux.  I have sent the medication sucralfate and a liquid form to your pharmacy and would like for you to take it 4 times daily (3 times with meals and once at bedtime).  Additionally, I would like for you to [facility] for [facility] barium swallow.  I would advise for you to keep your appointment to GI in early January for further evaluation.  You can return in 1 week for a physical and to discuss your concern regarding bipolar disorder.   Be well, Freddrick MarchYashika Eames Dibiasio, MD

## 2017-06-29 NOTE — Assessment & Plan Note (Signed)
Symptoms likely consistent with GERD.  -Recommend continue Protonix 40 mg twice daily - As sucralfate tablet is difficult for her to swallow, I have changed to a liquid suspension form.  -DG barium swallow for further evaluation   --GI appointment 07/28/2017

## 2017-07-01 ENCOUNTER — Ambulatory Visit (INDEPENDENT_AMBULATORY_CARE_PROVIDER_SITE_OTHER): Payer: Medicaid Other | Admitting: Family Medicine

## 2017-07-01 ENCOUNTER — Other Ambulatory Visit: Payer: Self-pay | Admitting: Family Medicine

## 2017-07-01 ENCOUNTER — Other Ambulatory Visit (HOSPITAL_COMMUNITY)
Admission: RE | Admit: 2017-07-01 | Discharge: 2017-07-01 | Disposition: A | Discharge status: Home / Self Care | Payer: Medicaid Other | Source: Ambulatory Visit | Attending: Family Medicine | Admitting: Family Medicine

## 2017-07-01 ENCOUNTER — Other Ambulatory Visit: Payer: Self-pay

## 2017-07-01 VITALS — BP 120/70 | HR 93 | Temp 98.2°F | Wt 208.6 lb

## 2017-07-01 DIAGNOSIS — H938X1 Other specified disorders of right ear: Secondary | ICD-10-CM | POA: Diagnosis not present

## 2017-07-01 DIAGNOSIS — Z111 Encounter for screening for respiratory tuberculosis: Secondary | ICD-10-CM

## 2017-07-01 DIAGNOSIS — H938X3 Other specified disorders of ear, bilateral: Secondary | ICD-10-CM | POA: Diagnosis not present

## 2017-07-01 DIAGNOSIS — N898 Other specified noninflammatory disorders of vagina: Secondary | ICD-10-CM

## 2017-07-01 DIAGNOSIS — Z0000 Encounter for general adult medical examination without abnormal findings: Secondary | ICD-10-CM | POA: Insufficient documentation

## 2017-07-01 DIAGNOSIS — B009 Herpesviral infection, unspecified: Secondary | ICD-10-CM

## 2017-07-01 DIAGNOSIS — K219 Gastro-esophageal reflux disease without esophagitis: Secondary | ICD-10-CM

## 2017-07-01 LAB — POCT WET PREP (WET MOUNT)
CLUE CELLS WET PREP WHIFF POC: NEGATIVE
TRICHOMONAS WET PREP HPF POC: ABSENT

## 2017-07-01 MED ORDER — VALACYCLOVIR HCL 1 G PO TABS: 1000.0000 mg | ORAL_TABLET | Freq: Every day | ORAL | 5 refills | Status: DC

## 2017-07-01 NOTE — Progress Notes (Signed)
   Subjective:   Patient ID: Angela Mosley    DOB: 1985/10/30, 31 y.o. female   MRN: [id number]  CC: herpes, follow up barium swallow, annual physical, TB test   HPI: Angela FredricksonJeronae Mareno is a 31 y.o. female who presents to clinic today for   Herpes outbreak  Patient notes minimal vaginal discharge.  She has not had any new sexual partners.  She states a few months back she used a poise pad for her menstrual cycle and ended up with a very bad yeast infection.  She reports vaginal itching however no bleeding.  No urinary symptoms of urgency, frequency or burning.  She reports she currently has an outbreak of herpes.  She recently got a refill on her Valtrex a few days ago but may need another.  She notes her outbreaks are out of the time of the menstrual cycle.  LMP is 06/26/2017.   Difficulty swallowing Patient recently had a barium swallow and would like to discuss the results.  No new or worsening symptoms.  She has a appointment with GI specialist in January.  ROS: No fevers, chills, nausea, vomiting, abdominal pain.  No urinary frequency or burning with urination. PMFSH: Pertinent past medical, surgical, family, and social history were reviewed and updated as appropriate. Smoking status reviewed. Medications reviewed.  Objective:   BP 120/70   Pulse 93   Temp 98.2 F (36.8 C) (Oral)   Wt 208 lb 9.6 oz (94.6 kg)   SpO2 99%   BMI 35.81 kg/m  Vitals and nursing note reviewed.  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  Requesting refill of her Valtrex.  She has not had any new sexual  partners.  She reports minimal vaginal discharge and would like to be tested for yeast/BV. - Wet prep  Gastroesophageal reflux disease without esophagitis Have discussed results of barium swallow with patient.  No strictures, masses or esophageal spasm noted. All questions were answered.  Will defer further workup to GI as she has an appointment coming up in January. -Recommend continue Protonix and sucralfate as directed - Advised patient to keep her appointment with gastroenterologist in January  Patient reports she would like to see an ENT for follow-up for right ear fullness. Provided referral to ENT -We will continue to follow  Health maintenance: -Physical exam today -Pap smear -TB QuantiFERON today -Work note given   Orders Placed This Encounter  Procedures  . QuantiFERON-TB Gold Plus  . Ambulatory referral to ENT    Referral Priority:   Routine    Referral Type:   Consultation    Referral Reason:   Specialty Services Required    Requested Specialty:   Otolaryngology    Number of Visits Requested:   1  . POCT Wet Prep [facility]([place] [place])   Meds ordered this encounter  Medications  . valACYclovir (VALTREX) 1000 MG tablet    Sig: Take 1 tablet (1,000 mg total) by mouth daily.    Dispense:  5 tablet    Refill:  5   Follow-up: PRN   Freddrick MarchYashika Florestine Carmical, MD [facility], PGY-2 07/02/2017 3:54 PM

## 2017-07-01 NOTE — Patient Instructions (Addendum)
You were seen in clinic for vaginal irritation most likely related to herpes outbreak.  I have sent in a refill of your Valtrex which she can pick up from your pharmacy.  Additionally, today we did a wet prep to check for other sources of vaginal irritation and a Pap smear. Along with your physical exam, we did a TB test.   I will call you with the results of these.  We reviewed the results of your barium swallow and I would advise for you to keep your appointment with gastroenterology for further workup.  For your sensation of not being able to hear, I have referred you to an ENT specialist.  You can expect a call within a week or so to have an appointment scheduled for this.  Please call clinic if you have any questions.  Be well, Freddrick MarchYashika Destany Severns, MD

## 2017-07-02 ENCOUNTER — Other Ambulatory Visit: Payer: Self-pay | Admitting: Family Medicine

## 2017-07-02 LAB — CYTOLOGY - PAP: DIAGNOSIS: NEGATIVE

## 2017-07-02 MED ORDER — FLUCONAZOLE 150 MG PO TABS: 150.0000 mg | ORAL_TABLET | Freq: Once | ORAL | 0 refills | Status: AC

## 2017-07-02 NOTE — Assessment & Plan Note (Addendum)
Patient reports a current outbreak.  Requesting refill of her Valtrex.  She has not had any new sexual partners.  She reports minimal vaginal discharge and would like to be tested for yeast/BV. - Wet prep

## 2017-07-02 NOTE — Assessment & Plan Note (Signed)
Have discussed results of barium swallow with patient.  No strictures, masses or esophageal spasm noted. All questions were answered.  Will defer further workup to GI as she has an appointment coming up in January. -Recommend continue Protonix and sucralfate as directed - Advised patient to keep her appointment with gastroenterologist in January

## 2017-07-07 ENCOUNTER — Other Ambulatory Visit: Payer: Self-pay | Admitting: Student in an Organized Health Care Education/Training Program

## 2017-07-07 DIAGNOSIS — K219 Gastro-esophageal reflux disease without esophagitis: Secondary | ICD-10-CM

## 2017-07-08 LAB — QUANTIFERON-TB GOLD PLUS
QUANTIFERON TB1 AG VALUE: 0.03 [IU]/mL
QUANTIFERON-TB GOLD PLUS: NEGATIVE
QuantiFERON Nil Value: 0.03 IU/mL
QuantiFERON TB2 Ag Value: 0.03 IU/mL

## 2017-07-12 ENCOUNTER — Other Ambulatory Visit: Payer: Self-pay

## 2017-07-12 ENCOUNTER — Encounter: Payer: Self-pay | Admitting: Family Medicine

## 2017-07-12 ENCOUNTER — Ambulatory Visit (INDEPENDENT_AMBULATORY_CARE_PROVIDER_SITE_OTHER): Payer: Medicaid Other | Admitting: Family Medicine

## 2017-07-12 ENCOUNTER — Ambulatory Visit: Payer: Self-pay | Admitting: Family Medicine

## 2017-07-12 VITALS — BP 112/64 | HR 80 | Temp 98.4°F | Ht 64.0 in | Wt 208.6 lb

## 2017-07-12 DIAGNOSIS — F3181 Bipolar II disorder: Secondary | ICD-10-CM | POA: Insufficient documentation

## 2017-07-12 MED ORDER — LURASIDONE HCL 20 MG PO TABS: 20.0000 mg | ORAL_TABLET | Freq: Every day | ORAL | 0 refills | Status: DC

## 2017-07-12 NOTE — Progress Notes (Signed)
Subjective:  Angela Mosley is a 31 y.o. female [facility] and anxiety  HPI:  Possible bipolar 2 disorder:  History of suicide attempt in 2015. Last followed up with Angela Mosley and Angela Mosley in February and came up with working diagnosis of bipolar 2.  Plan was for patient to research and follow up with [facility] and were going to discuss medication.  Based on chart review patient was contacted in March 2018 and she did not show up for her appointment and did not return phone message. Over the past couple of weeks feeling emotionally unstable, not cleaning her house and took first shower last night in two weeks. Has no thoughts of self-harm or thoughts of harming others. 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 Reports that she has been unable to concentrate and has been having difficulty with motivation as well, moving slowly and speaking slowly and increasing fatigue as well as decreased appetite.   PMH: GERD Tobacco use: none 1/2 gram marijuana daily Medication: reviewed and updated ROS: see HPI   Objective:  Physical Exam: BP 112/64   Pulse 80   Temp 98.4 F (36.9 C) (Oral)   Ht 5\' 4"  (1.626 m)   Wt 208 lb 9.6 oz (94.6 kg)   LMP 06/22/2017 (Approximate)   SpO2 99%   BMI 35.81 kg/m   Gen: 31 year old NAD, resting comfortably CV: RRR with no murmurs appreciated Pulm: NWOB, CTAB with no crackles, wheezes, or rhonchi GI: Normal bowel sounds present. Soft, Nontender, Nondistended. MSK: no edema, cyanosis, or clubbing noted Skin: warm, dry Neuro: grossly normal, moves all extremities Psych: Normal affect and thought content, depressed mood  No results found for this or any previous visit (from the past 72 hour(s)).   Dep[facility] screen [facility] 2[facility] 07/12/2017 07/12/2017 07/01/2017 06/22/2017 06/07/2017  Decreased Interest 3 3 0 0 0  Down, Depressed, Hopeless  3 3 0 0 0  PHQ - 2 Score 6 6 0 0 0  Altered sleeping 3 - - - -  Tired, decreased energy 3 - - - -  Change in appetite 3 - - - -  Feeling bad or failure about yourself  2 - - - -  Trouble concentrating 3 - - - -  Moving slowly or fidgety/restless 2 - - - -  Suicidal thoughts 0 - - - -  PHQ-9 Score 22 - - - -  Difficult doing work/chores Extremely dIfficult - - - -   GAD 7 : Generalized Anxiety Score 07/12/2017 08/28/2016  Nervous, Anxious, on Edge 3 3  Control/stop worrying 3 3  Worry too much - different things 3 3  Trouble relaxing 3 3  Restless 3 2  Easily annoyed or irritable 3 3  Afraid - awful might happen 2 2  Total GAD 7 Score 20 19  Anxiety Difficulty Extremely difficult (No Data)      Assessment/Plan:  Bipolar 2 disorder (HCC) Working diagnosis of bipolar 2 disorder first noted back in February 2018.  Patient has previously been on Zyprexa and reportedly "felt like a zombie".  However did note that symptoms were improved compared to currently.  Given the patient has previously established with mood clinic and follows up regularly with her clinic, I feel okay starting her on Latuda, which tends to have lower side effect profile compared to Zyprexa.  Discussed side effects and recommended  the patient follow-up in 1 week for safety monitoring, tolerability and efficacy.  Additionally patient is scheduled with mood clinic on August 11, 2016 at 11 AM.  Provided patient with card for Angela Mosley and recommended that she call and confirm appointment.  Discussed return precautions.  Additionally patient has GAD7 of 20 and PHQ9 of 22.  Likely has component of anxiety.  We will hold off on treating symptoms at this time.  Hopefully patient has good response with Latuda and will readdress additional symptoms at follow-up.   Angela Pina L. Myrtie SomanWarden, MD [facility] Resident PGY-2 07/12/2017 11:51 AM

## 2017-07-12 NOTE — Patient Instructions (Signed)
Angela Mosley, you were seen today to discuss your mood.  I have prescribed you a medication called Latuda.  I want you to take this medication daily with the largest meal of the day.  I want you to follow-up next week to make sure that you are not having any side effects and the medication is working.   I also scheduled an appointment free to follow-up with Dr. Pascal LuxKane on August 11, 2016 at 11 AM.   It was very nice to meet you today and help feel better.  [name] L. Myrtie SomanWarden, MD [facility] Resident PGY-2 07/12/2017 11:35 AM

## 2017-07-12 NOTE — Assessment & Plan Note (Addendum)
Working diagnosis of bipolar 2 disorder first noted back in February 2018.  Patient has previously been on Zyprexa and reportedly "felt like a zombie".  However did note that symptoms were improved compared to currently.  Given the patient has previously established with mood clinic and follows up regularly with her clinic, I feel okay starting her on Latuda, which tends to have lower side effect profile compared to Zyprexa.  Discussed side effects and recommended the patient follow-up in 1 week for safety monitoring, tolerability and efficacy.  Additionally patient is scheduled with mood clinic on August 11, 2016 at 11 AM.  Provided patient with card for Dr. Pascal LuxKane and recommended that she call and confirm appointment.  Discussed return precautions.  Additionally patient has GAD7 of 20 and PHQ9 of 22.  Likely has component of anxiety.  We will hold off on treating symptoms at this time.  Hopefully patient has good response with Latuda and will readdress additional symptoms at follow-up.

## 2017-07-14 ENCOUNTER — Other Ambulatory Visit: Payer: Self-pay

## 2017-07-14 ENCOUNTER — Ambulatory Visit (HOSPITAL_COMMUNITY)
Admission: EM | Admit: 2017-07-14 | Discharge: 2017-07-14 | Disposition: A | Discharge status: Home / Self Care | Payer: Medicaid Other | Attending: Family Medicine | Admitting: Family Medicine

## 2017-07-14 ENCOUNTER — Encounter (HOSPITAL_COMMUNITY): Payer: Self-pay | Admitting: Emergency Medicine

## 2017-07-14 DIAGNOSIS — L509 Urticaria, unspecified: Secondary | ICD-10-CM | POA: Diagnosis not present

## 2017-07-14 MED ORDER — PREDNISONE 10 MG (21) PO TBPK: ORAL_TABLET | Freq: Every day | ORAL | 0 refills | Status: DC

## 2017-07-14 NOTE — ED Triage Notes (Signed)
Pt c/o rash since yesterday all over body. Recently started [employer]

## 2017-07-14 NOTE — Discharge Instructions (Signed)
Stop Latuda. Follow up if not showing significant improvement over the next 24 hours.

## 2017-07-21 ENCOUNTER — Encounter: Payer: Self-pay | Admitting: Family Medicine

## 2017-07-21 ENCOUNTER — Other Ambulatory Visit: Payer: Self-pay

## 2017-07-21 ENCOUNTER — Other Ambulatory Visit: Payer: Self-pay | Admitting: Otolaryngology

## 2017-07-21 ENCOUNTER — Ambulatory Visit: Payer: Medicaid Other | Admitting: Family Medicine

## 2017-07-21 DIAGNOSIS — F3181 Bipolar II disorder: Secondary | ICD-10-CM

## 2017-07-21 DIAGNOSIS — H93A1 Pulsatile tinnitus, right ear: Secondary | ICD-10-CM

## 2017-07-21 NOTE — Patient Instructions (Addendum)
It was nice seeing you again! You were seen in clinic for follow up for your new medication - Latuda.  Unfortunately, due to the hives and itching, you should not continue this medication.  I have documented that you have an allergy to this and will be changing to a different one for your mood.   Due to the holiday schedule, the behavioral health clinic was not available today for us to consult.  However, I would like for you to try and move your next appointment date with them from 08/11/2017 to sooner so that you can be seen in mood clinic.  Please call clinic if you have any questions.   Be well,  Freddrick MarchYashika Kimberlynn Lumbra, MD

## 2017-07-21 NOTE — ED Provider Notes (Signed)
  [facility]   [id number] 07/14/17 Arrival Time: 1720  ASSESSMENT & PLAN:  1. Hives     Meds ordered this encounter  Medications  . predniSONE (STERAPRED UNI-PAK 21 TAB) 10 MG (21) TBPK tablet    Sig: Take by mouth daily. Take as directed.    Dispense:  21 tablet    Refill:  0   OTC Benadryl if needed. Will f/u in the next few days if not showing significant improvement. Reviewed expectations re: course of current medical issues. Questions answered. Outlined signs and symptoms indicating need for more acute intervention. Patient verbalized understanding. After Visit Summary given.   SUBJECTIVE:  Angela Mosley is a 31 y.o. female who presents with complaint of hives. Abrupt onset yesterday. "Just itching all over with a rash. Rash kind of comes and goes." Heat makes worse. Afebrile. No new exposures. H/O similar in distant past. No self treatment.  ROS: As per HPI.  OBJECTIVE: Vitals:   07/14/17 1757  BP: (!) 171/138  Pulse: 78  Resp: 16  Temp: 99 F (37.2 C)  SpO2: 100%    General appearance: alert; no distress Lungs: clear to auscultation bilaterally Heart: regular rate and rhythm Extremities: no edema Skin: warm and dry; diffuse wheals on trunk and extremities Psychological: alert and cooperative; normal mood and affect   Allergies  Allergen Reactions  . Hydrocodone Hives and Itching  . Latex Hives  . Latuda [Lurasidone Hcl]   . Tape Hives    Past Medical History:  Diagnosis Date  . Anxiety   . Depression   . Hyperemesis gravidarum   . PTSD (post-traumatic stress disorder)    Social History   Socioeconomic History  . Marital status: Single    Spouse name: Not on file  . Number of children: Not on file  . Years of education: Not on file  . Highest education level: Not on file  Social Needs  . Financial resource strain: Not on file  . Food insecurity - worry: Not on file  . Food insecurity - inability: Not on file  .  Transportation needs - medical: Not on file  . Transportation needs - non-medical: Not on file  Occupational History  . Not on file  Tobacco Use  . Smoking status: Former Smoker    Packs/day: 0.50    Years: 10.00    Pack years: 5.00    Types: Cigarettes    Last attempt to quit: 03/11/2016    Years since quitting: 1.3  . Smokeless tobacco: Never Used  Substance and Sexual Activity  . Alcohol use: No  . Drug use: No  . Sexual activity: Not Currently    Birth control/protection: Surgical  Other Topics Concern  . Not on file  Social History Narrative  . Not on file    Past Surgical History:  Procedure Laterality Date  . MOUTH SURGERY  2005  . TUBAL LIGATION       Mardella LaymanHagler, Sharel Behne, MD 07/21/17 1006

## 2017-07-21 NOTE — Progress Notes (Signed)
   Subjective:   Patient ID: Angela Mosley    DOB: Nov 25, 1985, 31 y.o. female   MRN: [id number]  CC: f/u mood disorder   HPI: Angela Mosley is a 31 y.o. female who presents to clinic today to follow up on mood disorder.  Bipolar 2  Patient started on Latuda 20 mg daily by Dr. Myrtie SomanWarden at office visit on 12/17 and went to Urgent care on 12/19 due to hives and itching.  Felt like her skin was on fire. Urgent care prescribed Prednisone taper pack and advised to stop taking Latuda.  She has completed the Prednisone pack and takes 2 Benadryl a day which has not helped much.  Rash swelling and redness has resolved and she only has the itching sensation now.   Hot water showers make her feel better.  She is still having symptoms of racing thoughts, difficulty sleeping, crying and emotional state. She states earlier this week she hadn't showered or done her hair in a while until yesterday because she was depressed but yesterday she felt like she had to do all of those things to "put on a show" for people coming over for the holidays.  She reports still feeling hopeless, but denies SI/HI.  She has no access to firearms and no plan to hurt herself. She reports just "feeling bad on the inside."    ROS: Denies fevers, chills, nausea, vomiting, diarrhea. Denies auditory and visual hallucinations.  Denies SI/HI.   PMFSH: Pertinent past medical, surgical, family, and social history were reviewed and updated as appropriate. Smoking status reviewed. Medications reviewed.  Objective:   BP 122/80   Pulse 66   Temp 98.8 F (37.1 C) (Oral)   Wt 210 lb 6.4 oz (95.4 kg)   LMP 06/22/2017 (Approximate)   SpO2 99%   BMI 36.12 kg/m  Vitals and nursing note reviewed.  General: 31 yo female, NAD  CV: RRR no MRG  Lungs: CTAB, non-laboured  Skin: warm, dry, no rash, slight redness on bilateral thighs from scratching her skin Extremities: warm and well perfused  Assessment & Plan:   Bipolar 2 disorder  (HCC) Recently started on Latuda 20 mg daily and developed allergic reaction after using for 2 days.  Reaction of hives and intense pruritus all over body.  Seen in UC and Rx for Pred dose pack which she has completed with resolution of symptoms. Has previously been on Zyprexa however this did not work well for her either.  Will likely need to switch to a different medication.  She is currently stable and has appointment scheduled with mood clinic for 08/11/2017.    Unfortunately due to holiday scheduling, unable to [facility], however advised patient to try to move mood clinic appointment closer for follow up.  She has agreed to call and confirm this appointment.  -Discontinue Kasandra KnudsenLatuda; Have documented as allergy on chart  -Advised continue Benadryl prn for itchiness, not to operate machinery after taking  -Return precautions discussed    Angela MarchYashika Will Heinkel, MD [facility], PGY-2 07/21/2017 11:21 AM

## 2017-07-21 NOTE — Assessment & Plan Note (Signed)
Recently started on Latuda 20 mg daily and developed allergic reaction after using for 2 days.  Reaction of hives and intense pruritus all over body.  Seen in UC and Rx for Pred dose pack which she has completed with resolution of symptoms. Has previously been on Zyprexa however this did not work well for her either.  Will likely need to switch to a different medication.  She is currently stable and has appointment scheduled with mood clinic for 08/11/2017.    Unfortunately due to holiday scheduling, [facility], however advised patient to try to move mood clinic appointment closer for follow up.  She has agreed to call and confirm this appointment.  -Discontinue Angela KnudsenLatuda; Have documented as allergy on chart  -Advised continue Benadryl prn for itchiness, not to operate machinery after taking  -Return precautions discussed

## 2017-07-31 ENCOUNTER — Ambulatory Visit
Admission: RE | Admit: 2017-07-31 | Discharge: 2017-07-31 | Disposition: A | Discharge status: Home / Self Care | Payer: Medicaid Other | Source: Ambulatory Visit | Attending: Otolaryngology | Admitting: Otolaryngology

## 2017-07-31 DIAGNOSIS — H93A1 Pulsatile tinnitus, right ear: Secondary | ICD-10-CM

## 2017-07-31 MED ORDER — GADOBENATE DIMEGLUMINE 529 MG/ML IV SOLN
20.0000 mL | Freq: Once | INTRAVENOUS | Status: AC | PRN
  Administered 2017-07-31: 20 mL via INTRAVENOUS

## 2017-08-02 ENCOUNTER — Encounter: Payer: Self-pay | Admitting: Gastroenterology

## 2017-08-02 ENCOUNTER — Ambulatory Visit: Payer: Medicaid Other | Admitting: Gastroenterology

## 2017-08-02 VITALS — BP 104/70 | HR 76 | Ht 63.5 in | Wt 205.0 lb

## 2017-08-02 DIAGNOSIS — K219 Gastro-esophageal reflux disease without esophagitis: Secondary | ICD-10-CM

## 2017-08-02 NOTE — Patient Instructions (Addendum)
You will be set up for an upper endoscopy for GERD, dysphagia.  Continue prontonix twice daily.  Start ranitidine 150mg  pill, one pill at bedtime nightly.  Normal BMI (Body Mass Index- based on height and weight) is between 19 and 25. Your BMI today is Body mass index is 35.74 kg/m. Marland Kitchen. Please consider follow up  regarding your BMI with your Primary Care Provider.

## 2017-08-02 NOTE — Progress Notes (Signed)
HPI: This is a very pleasant 32 year old woman who was referred to me by Howard PouchFeng, Lauren, MD  to evaluate GERD, dysphasia.    Chief complaint is GERD, dysphasia  10 years ago when she was pregnant she had hyperemesis gravidarum.  Around that time she was put on proton pump inhibitor and her symptoms improved although it does sound like she required hospitalization for nonstop vomiting for at least several months during that pregnancy.  She has chronic GERD symptoms of pyrosis especially when recumbent.  She intermittently takes some of her mom's proton pump inhibitor.  For the past 3 or 4 months, she has had worse recumbent symptoms and also a globus/dysphagia type symptoms.  She has a feeling of a lump moving back and forth up and down in her chest when she eats.  It does not usually limit her eating.  Her symptoms are much worse when she lays down.  She has not been on any antibiotics recently  Overall weight down 5 pounds.   Lately on protonix: BID;  Before BF and dinner.  For 6 weeks and this helps somewhat.  Night time is the worst for her.  Can have chest pains.  Old Data Reviewed: Barium esophagram 05/2017: IMPRESSION: 1. Poor initiation of the primary peristaltic stripping wave with resultant "to and fro" motion of barium in the high cervical esophagus. Recommend clinical correlation for etiologies of esophageal dysmotility in this young patient. 2. No mucosal irregularity, stricture or mass within the esophagus. 3. GE junction normal. 4. No gastroesophageal reflux demonstrated during exam.   Review of systems: Pertinent positive and negative review of systems were noted in the above HPI section. All other review negative.   Past Medical History:  Diagnosis Date  . Anxiety   . Depression   . Herpes   . Hyperemesis gravidarum   . PTSD (post-traumatic stress disorder)     Past Surgical History:  Procedure Laterality Date  . MOUTH SURGERY  2005  . TUBAL LIGATION       Current Outpatient Medications  Medication Sig Dispense Refill  . CARAFATE 1 GM/10ML suspension Take 10 mLs (1 g total) by mouth 4 (four) times daily - with meals and at bedtime. 420 mL 0  . pantoprazole (PROTONIX) 40 MG tablet Take 40 mg by mouth 2 (two) times daily.    . valACYclovir (VALTREX) 1000 MG tablet Take 1 tablet (1,000 mg total) by mouth daily. 5 tablet 5   No current facility-administered medications for this visit.     Allergies as of 08/02/2017 - Review Complete 08/02/2017  Allergen Reaction Noted  . Hydrocodone Hives and Itching 06/02/2011  . Latex Hives 06/02/2011  . Latuda [lurasidone hcl]  07/21/2017  . Tape Hives 06/02/2011    Family History  Problem Relation Age of Onset  . Diabetes Mother   . Hypertension Mother   . Heart attack Mother   . Hyperlipidemia Mother   . Pulmonary embolism Mother   . Cataracts Mother   . Glaucoma Mother   . Heart attack Brother   . Diabetes Maternal Grandmother   . Diabetes Maternal Grandfather   . Heart disease Maternal Grandfather   . Prostate cancer Paternal Grandfather   . Pancreatic cancer Paternal Grandfather   . Stomach cancer Paternal Grandfather        mets all to back and lung    Social History   Socioeconomic History  . Marital status: Single    Spouse name: Not on file  .  Number of children: 5  . Years of education: Not on file  . Highest education level: Not on file  Social Needs  . Financial resource strain: Not on file  . Food insecurity - worry: Not on file  . Food insecurity - inability: Not on file  . Transportation needs - medical: Not on file  . Transportation needs - non-medical: Not on file  Occupational History  . Occupation: Building surveyor  Tobacco Use  . Smoking status: Former Smoker    Packs/day: 0.50    Years: 10.00    Pack years: 5.00    Types: Cigarettes    Last attempt to quit: 03/11/2016    Years since quitting: 1.3  . Smokeless tobacco: Never Used  Substance and Sexual  Activity  . Alcohol use: No  . Drug use: Yes    Types: Marijuana  . Sexual activity: Not Currently    Birth control/protection: Surgical  Other Topics Concern  . Not on file  Social History Narrative  . Not on file     Physical Exam: BP 104/70 (BP Location: Left Arm, Patient Position: Sitting, Cuff Size: Normal)   Pulse 76   Ht 5' 3.5" (1.613 m) Comment: height measured without shoes  Wt 205 lb (93 kg)   LMP 07/25/2017   BMI 35.74 kg/m  Constitutional: generally well-appearing Psychiatric: alert and oriented x3 Eyes: extraocular movements intact Mouth: oral pharynx moist, no lesions Neck: supple no lymphadenopathy Cardiovascular: heart regular rate and rhythm Lungs: clear to auscultation bilaterally Abdomen: soft, nontender, nondistended, no obvious ascites, no peritoneal signs, normal bowel sounds Extremities: no lower extremity edema bilaterally Skin: no lesions on visible extremities   Assessment and plan: 32 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  I am giving her a GERD diet handout.  I recommended she stay on her proton pump inhibitor twice daily and that she add ranitidine 150 mg pills at bedtime every night as this is particularly helpful for overnight acid suppression.  I recommended upper endoscopy to evaluate for significant esophagitis, gastritis.  If the upper endoscopy is normal and she does not respond well to additional nighttime acid suppression then I would possibly proceed with motility workup with esophageal manometry test.  Please see the "Patient Instructions" section for addition details about the plan.   Rob Bunting, MD [facility] 08/02/2017, 8:55 AM  Cc: Howard Pouch, MD

## 2017-08-09 ENCOUNTER — Encounter: Payer: Self-pay | Admitting: Gastroenterology

## 2017-08-09 ENCOUNTER — Ambulatory Visit (AMBULATORY_SURGERY_CENTER): Payer: Medicaid Other | Admitting: Gastroenterology

## 2017-08-09 ENCOUNTER — Other Ambulatory Visit: Payer: Self-pay

## 2017-08-09 ENCOUNTER — Other Ambulatory Visit: Payer: Self-pay | Admitting: Family Medicine

## 2017-08-09 VITALS — BP 115/68 | HR 61 | Temp 98.0°F | Resp 9 | Ht 63.5 in | Wt 205.0 lb

## 2017-08-09 DIAGNOSIS — K219 Gastro-esophageal reflux disease without esophagitis: Secondary | ICD-10-CM | POA: Diagnosis not present

## 2017-08-09 DIAGNOSIS — R1310 Dysphagia, unspecified: Secondary | ICD-10-CM | POA: Diagnosis not present

## 2017-08-09 MED ORDER — SODIUM CHLORIDE 0.9 % IV SOLN: 500.0000 mL | Freq: Once | INTRAVENOUS | Status: DC

## 2017-08-09 MED ORDER — PANTOPRAZOLE SODIUM 40 MG PO TBEC: 40.0000 mg | DELAYED_RELEASE_TABLET | Freq: Two times a day (BID) | ORAL | 3 refills | Status: AC

## 2017-08-09 NOTE — Op Note (Signed)
[facility] Patient Name: Angela FredricksonJeronae Mosley Procedure Date: 08/09/2017 10:17 AM MRN: [id number] Endoscopist: Rachael Feeaniel P Lithzy Bernard , MD Age: 32 Referring MD:  Date of Birth: 12-05-1985 Gender: Female Account #: [id number] Procedure:                Upper GI endoscopy Indications:              Dysphagia, Heartburn Medicines:                Monitored Anesthesia Care Procedure:                Pre-Anesthesia Assessment:                           - Prior to the procedure, a History and Physical                            was performed, and patient medications and                            allergies were reviewed. The patient's tolerance of                            previous anesthesia was also reviewed. The risks                            and benefits of the procedure and the sedation                            options and risks were discussed with the patient.                            All questions were answered, and informed consent                            was obtained. Prior Anticoagulants: The patient has                            taken no previous anticoagulant or antiplatelet                            agents. ASA Grade Assessment: II - A patient with                            mild systemic disease. After reviewing the risks                            and benefits, the patient was deemed in                            satisfactory condition to undergo the procedure.                           After obtaining informed consent, the endoscope was  passed under direct vision. Throughout the                            procedure, the patient's blood pressure, pulse, and                            oxygen saturations were monitored continuously. The                            Endoscope was introduced through the mouth, and                            advanced to the second part of duodenum. The upper                            GI endoscopy was  accomplished without difficulty.                            The patient tolerated the procedure well. Scope In: Scope Out: Findings:                 The esophagus was normal.                           The stomach was normal.                           The examined duodenum was normal. Complications:            No immediate complications. Estimated blood loss:                            None. Estimated Blood Loss:     Estimated blood loss: none. Impression:               - Normal UGI tract. Recommendation:           - Patient has a contact number available for                            emergencies. The signs and symptoms of potential                            delayed complications were discussed with the                            patient. Return to normal activities tomorrow.                            Written discharge instructions were provided to the                            patient.                           - Resume previous diet.                           -  Continue present medications; Protonix twice                            daily and ranitidine (OTC) 150mg  pill, one pill at                            bedtime nightly.                           - Call Dr. Christella Hartigan' office in 6-7 weeks to report on                            your response. Rachael Fee, MD 08/09/2017 10:29:05 AM This report has been signed electronically.

## 2017-08-09 NOTE — Progress Notes (Signed)
Report given to PACU, vss 

## 2017-08-09 NOTE — Telephone Encounter (Signed)
Unsure why pt is requesting refill of this as she has had an allergic reaction to it and it was discontinued.

## 2017-08-09 NOTE — Progress Notes (Signed)
Pt. Reports no change in her medical or surgical history since her pre-visit 08/02/2017.

## 2017-08-09 NOTE — Patient Instructions (Signed)
Impressions/recommendations:  Normal UGI tract  YOU HAD AN ENDOSCOPIC PROCEDURE TODAY AT THE [facility]:   Refer to the procedure report that was given to you for any specific questions about what was found during the examination.  If the procedure report does not answer your questions, please call your gastroenterologist to clarify.  If you requested that your care partner not be given the details of your procedure findings, then the procedure report has been included in a sealed envelope for you to review at your convenience later.  YOU SHOULD EXPECT: Some feelings of bloating in the abdomen. Passage of more gas than usual.  Walking can help get rid of the air that was put into your GI tract during the procedure and reduce the bloating. If you had a lower endoscopy (such as a colonoscopy or flexible sigmoidoscopy) you may notice spotting of blood in your stool or on the toilet paper. If you underwent a bowel prep for your procedure, you may not have a normal bowel movement for a few days.  Please Note:  You might notice some irritation and congestion in your nose or some drainage.  This is from the oxygen used during your procedure.  There is no need for concern and it should clear up in a day or so.  SYMPTOMS TO REPORT IMMEDIATELY:   Following upper endoscopy (EGD)  Vomiting of blood or coffee ground material  New chest pain or pain under the shoulder blades  Painful or persistently difficult swallowing  New shortness of breath  Fever of 100F or higher  Black, tarry-looking stools  For urgent or emergent issues, a gastroenterologist can be reached at any hour by calling [phone].   DIET:  We do recommend a small meal at first, but then you may proceed to your regular diet.  Drink plenty of fluids but you should avoid alcoholic beverages for 24 hours.  ACTIVITY:  You should plan to take it easy for the rest of today and you should NOT DRIVE or use heavy machinery  until tomorrow (because of the sedation medicines used during the test).    FOLLOW UP: Our staff will call the number listed on your records the next business day following your procedure to check on you and address any questions or concerns that you may have regarding the information given to you following your procedure. If we do not reach you, we will leave a message.  However, if you are feeling well and you are not experiencing any problems, there is no need to return our call.  We will assume that you have returned to your regular daily activities without incident.  If any biopsies were taken you will be contacted by phone or by letter within the next 1-3 weeks.  Please call us at [phone] if you have not heard about the biopsies in 3 weeks.    SIGNATURES/CONFIDENTIALITY: You and/or your care partner have signed paperwork which will be entered into your electronic medical record.  These signatures attest to the fact that that the information above on your After Visit Summary has been reviewed and is understood.  Full responsibility of the confidentiality of this discharge information lies with you and/or your care-partner.

## 2017-08-10 ENCOUNTER — Telehealth: Payer: Self-pay | Admitting: *Deleted

## 2017-08-10 NOTE — Telephone Encounter (Signed)
  Follow up Call-  Call back number 08/09/2017  Post procedure Call Back phone  # [phone]  Permission to leave phone message Yes  Some recent data might be hidden     Patient questions:  Do you have a fever, pain , or abdominal swelling? No. Pain Score  0 *  Have you tolerated food without any problems? Yes.    Have you been able to return to your normal activities? Yes.    Do you have any questions about your discharge instructions: Diet   No. Medications  No. Follow up visit  No.  Do you have questions or concerns about your Care? No.  Actions: * If pain score is 4 or above: No action needed, pain <4.

## 2017-08-11 ENCOUNTER — Ambulatory Visit: Payer: Self-pay | Admitting: Psychology

## 2017-08-17 ENCOUNTER — Encounter: Payer: Self-pay | Admitting: Neurology

## 2017-08-17 ENCOUNTER — Ambulatory Visit: Payer: Medicaid Other | Admitting: Neurology

## 2017-08-17 VITALS — BP 101/56 | HR 67 | Ht 62.0 in | Wt 209.0 lb

## 2017-08-17 DIAGNOSIS — G935 Compression of brain: Secondary | ICD-10-CM

## 2017-08-17 DIAGNOSIS — H547 Unspecified visual loss: Secondary | ICD-10-CM | POA: Diagnosis not present

## 2017-08-17 DIAGNOSIS — G932 Benign intracranial hypertension: Secondary | ICD-10-CM

## 2017-08-17 DIAGNOSIS — H93A9 Pulsatile tinnitus, unspecified ear: Secondary | ICD-10-CM

## 2017-08-17 DIAGNOSIS — G08 Intracranial and intraspinal phlebitis and thrombophlebitis: Secondary | ICD-10-CM

## 2017-08-17 MED ORDER — ACETAZOLAMIDE 250 MG PO TABS: 250.0000 mg | ORAL_TABLET | Freq: Three times a day (TID) | ORAL | 6 refills | Status: DC

## 2017-08-17 NOTE — Progress Notes (Signed)
[facility]    Provider:  Dr Lucia Gaskins Referring Provider: [name], MD Primary Care Physician:  [name], MD  CC:  Fluid/pressure on my brain  HPI:  Angela Mosley is a 32 y.o. female here as a referral from Dr. Nelson Chimes for pulsatile tinnitus.  Past medical history of depression and anxiety. Symptoms started 2 years ago, pulsing noise, if she puts pressure on the right upper neck it stops, continuous. It has gotten louder and more annoying. She also has severe headaches, they though they were tension headaches, she has been to the hospital, she has had nausea and vomiibt, all over and severe pressure. The headaches come and go, severe, unknown triggers. She has episodes where her eyes get blurry, she has to come back into focus with her eyes. No other focal neurologic deficits, associated symptoms, inciting events or modifiable factors.   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 This constellation of findings is most commonly associated with headache, but can also result in pulsatile tinnitus.  This is a 2-year history of pulsating sound in the right ear, makes it hard to hear, unable to use the telephone, when is not pulsating she can hear just fine.  Resting the right upper neck area makes noise go away.  Also turning her head to the right has the same effect.  She has no symptoms related to the left ear.  No change in her weight.  Otherwise in good health.  Exam was essentially normal, diagnosed with a probable venous hum, MRI and MRA were apparently ordered by ear nose and throat.   Review of  Systems: Patient complains of symptoms per HPI as well as the following symptoms: Hearing loss, ringing the ears, headache. Pertinent negatives and positives per HPI. All others negative.   Social History   Socioeconomic History  . Marital status: Single    Spouse name: Not on file  . Number of children: 5  . Years of education: Not on file  . Highest education level: GED or equivalent  Social Needs  . Financial resource strain: Not on file  . Food insecurity - worry: Not on file  . Food insecurity - inability: Not on file  . Transportation needs - medical: Not on file  . Transportation needs - non-medical: Not on file  Occupational History  . Occupation: Building surveyor  Tobacco Use  . Smoking status: Former Smoker    Packs/day: 0.50    Years: 10.00    Pack years: 5.00    Types: Cigarettes    Last attempt to quit: 03/11/2016    Years since quitting: 1.4  . Smokeless tobacco: Never Used  Substance and Sexual Activity  . Alcohol use: No  . Drug use: Yes    Types: Marijuana  . Sexual activity: Not Currently    Birth control/protection: Surgical  Other Topics Concern  . Not on file  Social History Narrative   Lives at home with fiance & children   Right handed   Drinks 3 sodas daily    Family History  Problem Relation Age of Onset  . Diabetes Mother   . Hypertension Mother   . Heart attack Mother   . Hyperlipidemia Mother   .  Pulmonary embolism Mother   . Cataracts Mother   . Glaucoma Mother   . Heart attack Brother   . Diabetes Maternal Grandmother   . Diabetes Maternal Grandfather   . Heart disease Maternal Grandfather   . Prostate cancer Paternal Grandfather   . Pancreatic cancer Paternal Grandfather   . Stomach cancer Paternal Grandfather        mets all to back and lung  . Cerebral aneurysm Paternal Aunt     Past Medical History:  Diagnosis Date  . Anxiety   . Depression   . Headache   . Herpes   . Hyperemesis gravidarum   . PTSD (post-traumatic  stress disorder)     Past Surgical History:  Procedure Laterality Date  . MOUTH SURGERY  2005  . TUBAL LIGATION  2012    Current Outpatient Medications  Medication Sig Dispense Refill  . CARAFATE 1 GM/10ML suspension Take 10 mLs (1 g total) by mouth 4 (four) times daily - with meals and at bedtime. 420 mL 0  . pantoprazole (PROTONIX) 40 MG tablet Take 1 tablet (40 mg total) by mouth 2 (two) times daily. 180 tablet 3  . valACYclovir (VALTREX) 1000 MG tablet Take 1 tablet (1,000 mg total) by mouth daily. 5 tablet 5  . acetaZOLAMIDE (DIAMOX) 250 MG tablet Take 1 tablet (250 mg total) by mouth 3 (three) times daily. 90 tablet 6   No current facility-administered medications for this visit.     Allergies as of 08/17/2017 - Review Complete 08/17/2017  Allergen Reaction Noted  . Hydrocodone Hives and Itching 06/02/2011  . Latex Hives 06/02/2011  . Latuda [lurasidone hcl]  07/21/2017  . Tape Hives 06/02/2011    Vitals: BP (!) 101/56 (BP Location: Right Arm, Patient Position: Sitting)   Pulse 67   Ht 5\' 2"  (1.575 m)   Wt 209 lb (94.8 kg)   LMP 07/25/2017   BMI 38.23 kg/m  Last Weight:  Wt Readings from Last 1 Encounters:  08/17/17 209 lb (94.8 kg)   Last Height:   Ht Readings from Last 1 Encounters:  08/17/17 5\' 2"  (1.575 m)     Physical exam: Exam: Gen: NAD, conversant, well nourised, obese, well groomed                     CV: RRR, no MRG. No Carotid Bruits. No peripheral edema, warm, nontender Eyes: Conjunctivae clear without exudates or hemorrhage  Neuro: Detailed Neurologic Exam  Speech:    Speech is normal; fluent and spontaneous with normal comprehension.  Cognition:    The patient is oriented to person, place, and time;     recent and remote memory intact;     language fluent;     normal attention, concentration,     fund of knowledge Cranial Nerves:    The pupils are equal, round, and reactive to light. The fundi are normal and spontaneous venous  pulsations are present. Visual fields are full to finger confrontation. Extraocular movements are intact. Trigeminal sensation is intact and the muscles of mastication are normal. The face is symmetric. The palate elevates in the midline. Hearing intact. Voice is normal. Shoulder shrug is normal. The tongue has normal motion without fasciculations.   Coordination:    Normal finger to nose and heel to shin. Normal rapid alternating movements.   Gait:    Heel-toe and tandem gait are normal.   Motor Observation:    No asymmetry, no atrophy, and no involuntary movements noted. Tone:  Normal muscle tone.    Posture:    Posture is normal. normal erect    Strength:    Strength is V/V in the upper and lower limbs.      Sensation: intact to LT     Reflex Exam:  DTR's:    Deep tendon reflexes in the upper and lower extremities are normal bilaterally.   Toes:    The toes are downgoing bilaterally.   Clonus:    Clonus is absent.     Assessment/Plan: 32 year old female, obese BMI 38, with pulsatile tinnitus, headaches, vision changes.  MRI of the brain was consistent with intracranial hypertension with an empty sella and flattening of the posterior globes, also seen Chiari malformation with pegged appearance, questionable stenosis transverse sinus.  Dr. 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 Completed 06/13/2016 with opening pressure of 30. Start Diamox 250 mg 3 times daily and can further increase Discussed intracranial hypertension, pseudotumor cerebra, risk of vision loss, if she has any acute changes she needs to go to the emergency room immediately. 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 CT VENOGRAM HEAD  . Ambulatory referral to Ophthalmology  . Ambulatory referral to Neurosurgery    Discussed: To prevent or relieve headaches, try the following: Cool Compress. Lie down and place a cool compress on your head.  Avoid headache triggers. If certain foods or odors seem to have triggered your migraines in the past, avoid them. A headache diary might help you identify triggers.  Include physical activity in your daily routine. Try a daily walk or other moderate aerobic exercise.  Manage stress. Find healthy ways to cope with the stressors, such as delegating tasks on your to-do list.  Practice relaxation techniques. Try deep breathing, yoga, massage and visualization.  Eat regularly. Eating regularly scheduled meals and maintaining a healthy diet might help prevent headaches. Also, drink plenty of fluids.  Follow a regular sleep schedule. Sleep deprivation might contribute to headaches Consider biofeedback. With this mind-body technique, you learn to control certain bodily functions - such as muscle tension, heart rate and blood pressure - to prevent headaches or reduce headache pain.    Proceed to emergency room if you experience new or worsening symptoms or symptoms do not resolve, if you have new neurologic symptoms or if headache is severe, or for any concerning symptom.   Provided education and documentation from American headache Society toolbox including articles on: chronic migraine medication overuse headache, chronic migraines, prevention of migraines, behavioral and other nonpharmacologic treatments for headache.     Angela Dean, MD  [facility] [address] [place], [place] [address]  Phone [phone] Fax [phone]

## 2017-08-17 NOTE — Patient Instructions (Addendum)
Dr. Dione Booze Ophthalmologist Cat Scan of the veins in the head Acetazolamide: three times a day Lumbar Puncture   Idiopathic Intracranial Hypertension Idiopathic intracranial hypertension (IIH) is a condition that increases pressure around the brain. The fluid that surrounds the brain and spinal cord (cerebrospinal fluid, CSF) increases and causes the pressure. Idiopathic means that the cause of this condition is not known. IIH affects the brain and spinal cord (is a neurological disorder). If this condition is not treated, it can cause vision loss or blindness. What increases the risk? You are more likely to develop this condition if:  You are severely overweight (obese).  You are a woman who has not gone through menopause.  You take certain medicines, such as birth control or steroids.  What are the signs or symptoms? Symptoms of IIH include:  Headaches. This is the most common symptom.  Pain in the shoulders or neck.  Nausea and vomiting.  A "rushing water" or pulsing sound within the ears (pulsatile tinnitus).  Double vision.  Blurred vision.  Brief episodes of complete vision loss.  How is this diagnosed? This condition may be diagnosed based on:  Your symptoms.  Your medical history.  CT scan of the brain.  MRI of the brain.  Magnetic resonance venogram (MRV) to check veins in the brain.  Diagnostic lumbar puncture. This is a procedure to remove and examine a sample of cerebrospinal fluid. This procedure can determine whether too much fluid may be causing IIH.  A thorough eye exam to check for swelling or nerve damage in the eyes.  How is this treated? Treatment for this condition depends on your symptoms. The goal of treatment is to decrease the pressure around your brain. Common treatments include:  Medicines to decrease the production of spinal fluid and lower the pressure within your skull.  Medicines to prevent or treat headaches.  Surgery to place  drains (shunts) in your brain to remove excess fluid.  Lumbar puncture to remove excess cerebrospinal fluid.  Follow these instructions at home:  If you are overweight or obese, work with your health care provider to lose weight.  Take over-the-counter and prescription medicines only as told by your health care provider.  Do not drive or use heavy machinery while taking medicines that can make you sleepy.  Keep all follow-up visits as told by your health care provider. This is important. Contact a health care provider if:  You have changes in your vision, such as: ? Double vision. ? Not being able to see colors (color vision). Get help right away if:  You have any of the following symptoms and they get worse or do not get better. ? Headaches. ? Nausea. ? Vomiting. ? Vision changes or difficulty seeing. Summary  Idiopathic intracranial hypertension (IIH) is a condition that increases pressure around the brain. The cause is not known (is idiopathic).  The most common symptom of IIH is headaches.  Treatment may include medicines or surgery to relieve the pressure on your brain. This information is not intended to replace advice given to you by your health care provider. Make sure you discuss any questions you have with your health care provider. Document Released: 09/21/2001 Document Revised: 06/03/2016 Document Reviewed: 06/03/2016 Elsevier Interactive Patient Education  2017 [employer]  Chiari Malformation Chiari malformation (CM) is a type of brain abnormality that involves the parts of your brain that are important for balance (cerebellum) and basic body functions (brain stem). Normally, the cerebellum is located in a space  at the back part of the skull, just above the opening for the spinal cord (foramen magnum). With CM, part of the cerebellum is located below the foramen magnum instead. This can cause neck pain, balance problems, and other symptoms. There are five types  of CM:  Type I. ? This is the most common type. It can cause cerebrospinal fluid (CSF) to not flow between your brain and spinal cord as it normally should. ? This type may not cause symptoms, and it often goes unnoticed.  Type II. ? This type is present at birth (congenital), and it usually involves a condition in which the spine does not form properly (spina bifida). ? Type II also involves having a larger portion of brain structures move down and push through (herniate) the foramen magnum into the spinal canal.  Type III. ? This type is more severe than Types I and II, and it is the least common type. ? Type III often occurs with a type of congenital disability in which an opening in the skull causes the cerebellum and brain stem and their coverings to bulge out in a sac (encephalocele).  Type IV. ? This is also a severe form of CM. ? Part of the cerebellum may be missing, and parts of the spine and skull may be visible.  Type 0. ? In this type, the cerebellum does not herniate into or below the foramen magnum. However, abnormal flow of CSF between the brain and spinal cord creates a collection of fluid inside the spinal cord (syrinx). This may cause symptoms.  What are the causes? CM is most commonly congenital. CM can occur when the skull forms incorrectly and provides less space for the cerebellum than normal. In rare cases, CM may also develop later in life (acquired CM or secondary CM). These cases may be caused by:  Injury.  Infection.  Abnormal structure or pressure develops in the brain, and this pushes the cerebellum down into the foramen magnum. What increases the risk? Congenital CM is more likely to occur in:  Females.  People who have a family history of CM.  What are the signs or symptoms? CM may not cause any symptoms. Symptoms may also vary depending on the type and severity of CM. Symptoms may also come and go. The most common symptom is a severe headache in  the back of the head. Headache pain may come and go. It may also spread to your neck and shoulders. The pain may be worse when you cough, sneeze, or strain. Other symptoms include:  Difficulty balancing.  Loss of coordination.  Trouble swallowing or speaking.  Muscle weakness.  Feeling dizzy.  Ringing in the ears.  Fainting.  Trouble sleeping.  Fatigue.  Tingling or burning sensations in the fingers or toes.  Hearing problems.  Vision problems.  Vomiting.  Depression.  Seizures. These are only present in more severe types of CM.  How is this diagnosed? This condition may be diagnosed with a medical history and physical exam. This may include tests to check your balance and your memory. You may also have other tests, including:  X-ray.  CT scan.  MRI.  How is this treated? Treatment for this condition depends on your symptoms and the type of CM that you have. If you have headaches or neck pain, you may be treated with pain medicine or massage therapy. If you have symptoms of CM that are severe or are getting worse, your health care provider may recommend surgery to  control your symptoms and prevent the malformation from getting worse. If you do not have symptoms, you may not need treatment. Follow these instructions at home:  Take medicines only as directed by your health care provider.  Avoid activities that involve straining and heavy lifting.  Consider joining a CM support group.  Keep all follow-up visits as directed by your health care provider. This is important. Contact a health care provider if:  You have new symptoms.  Your symptoms get worse. Get help right away if:  You have seizures that are new or different than before. This information is not intended to replace advice given to you by your health care provider. Make sure you discuss any questions you have with your health care provider. Document Released: 07/03/2002 Document Revised:  12/19/2015 Document Reviewed: 03/21/2014 Elsevier Interactive Patient Education  2018 [employer].   Acetazolamide tablets What is this medicine? ACETAZOLAMIDE (a set a ZOLE a mide) is used to treat glaucoma and some seizure disorders. It may be used to treat edema or swelling from heart failure or from other medicines. This medicine is also used to treat and to prevent altitude or mountain sickness. This medicine may be used for other purposes; ask your health care provider or pharmacist if you have questions. COMMON BRAND NAME(S): Diamox What should I tell my health care provider before I take this medicine? They need to know if you have any of these conditions: -diabetes -kidney disease -liver disease -lung disease -an unusual or allergic reaction to acetazolamide, sulfa drugs, other medicines, foods, dyes, or preservatives -pregnant or trying to get pregnant -breast-feeding How should I use this medicine? Take this medicine by mouth with a glass of water. Follow the directions on the prescription label. Take this medicine with food if it upsets your stomach. Take your doses at regular intervals. Do not take your medicine more often than directed. Do not stop taking except on your doctor's advice. Talk to your pediatrician regarding the use of this medicine in children. Special care may be needed. Patients over 64 years old may have a stronger reaction and need a smaller dose. Overdosage: If you think you have taken too much of this medicine contact a poison control center or emergency room at once. NOTE: This medicine is only for you. Do not share this medicine with others. What if I miss a dose? If you miss a dose, take it as soon as you can. If it is almost time for your next dose, take only that dose. Do not take double or extra doses. What may interact with this medicine? Do not take this medicine with any of the following medications: -methazolamide This medicine may also  interact with the following medications: -aspirin and aspirin-like medicines -cyclosporine -lithium -medicine for diabetes -methenamine -other diuretics -phenytoin -primidone -quinidine -sodium bicarbonate -stimulant medicines like dextroamphetamine This list may not describe all possible interactions. Give your health care provider a list of all the medicines, herbs, non-prescription drugs, or dietary supplements you use. Also tell them if you smoke, drink alcohol, or use illegal drugs. Some items may interact with your medicine. What should I watch for while using this medicine? Visit your doctor or health care professional for regular checks on your progress. You will need blood work done regularly. If you are diabetic, check your blood sugar as directed. You may need to be on a special diet while taking this medicine. Ask your doctor. Also, ask how many glasses of fluid you need to  drink a day. You must not get dehydrated. You may get drowsy or dizzy. Do not drive, use machinery, or do anything that needs mental alertness until you know how this medicine affects you. Do not stand or sit up quickly, especially if you are an older patient. This reduces the risk of dizzy or fainting spells. This medicine can make you more sensitive to the sun. Keep out of the sun. If you cannot avoid being in the sun, wear protective clothing and use sunscreen. Do not use sun lamps or tanning beds/booths. What side effects may I notice from receiving this medicine? Side effects that you should report to your doctor or health care professional as soon as possible: -allergic reactions like skin rash, itching or hives, swelling of the face, lips, or tongue -breathing problems -confusion, depression -dark urine -fever -numbness, tingling in hands or feet -redness, blistering, peeling or loosening of the skin, including inside the mouth -ringing in the ears -seizures -unusually weak or tired -yellowing of  the eyes or skin Side effects that usually do not require medical attention (report to your doctor or health care professional if they continue or are bothersome): -change in taste -diarrhea -headache -loss of appetite -nausea, vomiting -passing urine more often This list may not describe all possible side effects. Call your doctor for medical advice about side effects. You may report side effects to FDA at 1-800-FDA-1088. Where should I keep my medicine? Keep out of the reach of children. Store at room temperature between 20 and 25 degrees C (68 and 77 degrees F). Throw away any unused medicine after the expiration date. NOTE: This sheet is a summary. It may not cover all possible information. If you have questions about this medicine, talk to your doctor, pharmacist, or health care provider.  2018 Elsevier/Gold Standard (2007-10-05 10:59:40)

## 2017-08-27 DIAGNOSIS — B958 Unspecified staphylococcus as the cause of diseases classified elsewhere: Secondary | ICD-10-CM

## 2017-08-27 HISTORY — DX: Unspecified staphylococcus as the cause of diseases classified elsewhere: B95.8

## 2017-08-29 ENCOUNTER — Inpatient Hospital Stay (HOSPITAL_COMMUNITY)
Admission: EM | Admit: 2017-08-29 | Discharge: 2017-08-31 | DRG: 103 | Disposition: A | Discharge status: Home / Self Care | Payer: Medicaid Other | Attending: Family Medicine | Admitting: Family Medicine

## 2017-08-29 ENCOUNTER — Emergency Department (HOSPITAL_COMMUNITY): Payer: Medicaid Other

## 2017-08-29 ENCOUNTER — Encounter (HOSPITAL_COMMUNITY): Payer: Self-pay

## 2017-08-29 ENCOUNTER — Other Ambulatory Visit: Payer: Self-pay

## 2017-08-29 DIAGNOSIS — K219 Gastro-esophageal reflux disease without esophagitis: Secondary | ICD-10-CM | POA: Diagnosis present

## 2017-08-29 DIAGNOSIS — Z83511 Family history of glaucoma: Secondary | ICD-10-CM

## 2017-08-29 DIAGNOSIS — G932 Benign intracranial hypertension: Principal | ICD-10-CM | POA: Diagnosis present

## 2017-08-29 DIAGNOSIS — E669 Obesity, unspecified: Secondary | ICD-10-CM | POA: Diagnosis present

## 2017-08-29 DIAGNOSIS — G2402 Drug induced acute dystonia: Secondary | ICD-10-CM | POA: Diagnosis present

## 2017-08-29 DIAGNOSIS — Z91040 Latex allergy status: Secondary | ICD-10-CM

## 2017-08-29 DIAGNOSIS — R200 Anesthesia of skin: Secondary | ICD-10-CM | POA: Diagnosis not present

## 2017-08-29 DIAGNOSIS — T450X5A Adverse effect of antiallergic and antiemetic drugs, initial encounter: Secondary | ICD-10-CM | POA: Diagnosis present

## 2017-08-29 DIAGNOSIS — R42 Dizziness and giddiness: Secondary | ICD-10-CM

## 2017-08-29 DIAGNOSIS — G43909 Migraine, unspecified, not intractable, without status migrainosus: Secondary | ICD-10-CM | POA: Diagnosis present

## 2017-08-29 DIAGNOSIS — R519 Headache, unspecified: Secondary | ICD-10-CM

## 2017-08-29 DIAGNOSIS — Z885 Allergy status to narcotic agent status: Secondary | ICD-10-CM

## 2017-08-29 DIAGNOSIS — Z8349 Family history of other endocrine, nutritional and metabolic diseases: Secondary | ICD-10-CM

## 2017-08-29 DIAGNOSIS — Z8249 Family history of ischemic heart disease and other diseases of the circulatory system: Secondary | ICD-10-CM

## 2017-08-29 DIAGNOSIS — F3181 Bipolar II disorder: Secondary | ICD-10-CM | POA: Diagnosis present

## 2017-08-29 DIAGNOSIS — A6000 Herpesviral infection of urogenital system, unspecified: Secondary | ICD-10-CM | POA: Diagnosis present

## 2017-08-29 DIAGNOSIS — R51 Headache: Secondary | ICD-10-CM

## 2017-08-29 DIAGNOSIS — Z6837 Body mass index (BMI) 37.0-37.9, adult: Secondary | ICD-10-CM

## 2017-08-29 DIAGNOSIS — R110 Nausea: Secondary | ICD-10-CM

## 2017-08-29 DIAGNOSIS — Z888 Allergy status to other drugs, medicaments and biological substances status: Secondary | ICD-10-CM

## 2017-08-29 DIAGNOSIS — Z833 Family history of diabetes mellitus: Secondary | ICD-10-CM

## 2017-08-29 DIAGNOSIS — Y92230 Patient room in hospital as the place of occurrence of the external cause: Secondary | ICD-10-CM | POA: Diagnosis present

## 2017-08-29 DIAGNOSIS — Z87891 Personal history of nicotine dependence: Secondary | ICD-10-CM

## 2017-08-29 DIAGNOSIS — H539 Unspecified visual disturbance: Secondary | ICD-10-CM

## 2017-08-29 LAB — I-STAT BETA HCG BLOOD, ED (MC, WL, AP ONLY)

## 2017-08-29 MED ORDER — ACETAZOLAMIDE 250 MG PO TABS
250.0000 mg | ORAL_TABLET | Freq: Three times a day (TID) | ORAL | Status: DC
  Administered 2017-08-30 (×2): 250 mg via ORAL
  Filled 2017-08-29 (×3): qty 1

## 2017-08-29 MED ORDER — VALACYCLOVIR HCL 500 MG PO TABS
1000.0000 mg | ORAL_TABLET | Freq: Every day | ORAL | Status: DC
  Administered 2017-08-30 – 2017-08-31 (×2): 1000 mg via ORAL
  Filled 2017-08-29 (×2): qty 2

## 2017-08-29 MED ORDER — KETOROLAC TROMETHAMINE 30 MG/ML IJ SOLN
30.0000 mg | Freq: Once | INTRAMUSCULAR | Status: AC
  Administered 2017-08-29: 30 mg via INTRAVENOUS
  Filled 2017-08-29: qty 1

## 2017-08-29 MED ORDER — SODIUM CHLORIDE 0.9 % IV BOLUS (SEPSIS)
1000.0000 mL | Freq: Once | INTRAVENOUS | Status: AC
  Administered 2017-08-29: 1000 mL via INTRAVENOUS

## 2017-08-29 MED ORDER — PANTOPRAZOLE SODIUM 40 MG PO TBEC
40.0000 mg | DELAYED_RELEASE_TABLET | Freq: Two times a day (BID) | ORAL | Status: DC
  Administered 2017-08-29 – 2017-08-31 (×4): 40 mg via ORAL
  Filled 2017-08-29 (×4): qty 1

## 2017-08-29 MED ORDER — MORPHINE SULFATE (PF) 4 MG/ML IV SOLN: 4.0000 mg | Freq: Once | INTRAVENOUS | Status: DC

## 2017-08-29 MED ORDER — IOPAMIDOL (ISOVUE-300) INJECTION 61%
INTRAVENOUS | Status: AC
  Administered 2017-08-29: 75 mL
  Filled 2017-08-29: qty 75

## 2017-08-29 MED ORDER — LORAZEPAM 2 MG/ML IJ SOLN: 1.0000 mg | Freq: Once | INTRAMUSCULAR | Status: DC

## 2017-08-29 MED ORDER — ACETAMINOPHEN 650 MG RE SUPP: 650.0000 mg | Freq: Four times a day (QID) | RECTAL | Status: DC | PRN

## 2017-08-29 MED ORDER — SUCRALFATE 1 GM/10ML PO SUSP
2.0000 g | Freq: Two times a day (BID) | ORAL | Status: DC
  Administered 2017-08-30 – 2017-08-31 (×3): 2 g via ORAL
  Filled 2017-08-29 (×3): qty 20

## 2017-08-29 MED ORDER — DIPHENHYDRAMINE HCL 50 MG/ML IJ SOLN
25.0000 mg | Freq: Once | INTRAMUSCULAR | Status: AC
  Administered 2017-08-29: 25 mg via INTRAVENOUS
  Filled 2017-08-29: qty 1

## 2017-08-29 MED ORDER — IBUPROFEN 200 MG PO TABS
600.0000 mg | ORAL_TABLET | Freq: Four times a day (QID) | ORAL | Status: DC | PRN
  Administered 2017-08-30 – 2017-08-31 (×2): 600 mg via ORAL
  Filled 2017-08-29 (×2): qty 3

## 2017-08-29 MED ORDER — ENOXAPARIN SODIUM 40 MG/0.4ML ~~LOC~~ SOLN
40.0000 mg | Freq: Every day | SUBCUTANEOUS | Status: DC
  Administered 2017-08-30 – 2017-08-31 (×2): 40 mg via SUBCUTANEOUS
  Filled 2017-08-29 (×2): qty 0.4

## 2017-08-29 MED ORDER — ACETAMINOPHEN 325 MG PO TABS: 650.0000 mg | ORAL_TABLET | Freq: Four times a day (QID) | ORAL | Status: DC | PRN

## 2017-08-29 MED ORDER — METOCLOPRAMIDE HCL 5 MG/ML IJ SOLN
10.0000 mg | Freq: Once | INTRAMUSCULAR | Status: AC
  Administered 2017-08-29: 10 mg via INTRAVENOUS
  Filled 2017-08-29: qty 2

## 2017-08-29 NOTE — Consult Note (Signed)
Neurology Consultation Reason for Consult: headache Referring Physician: Rhona Raider [facility]   History is obtained from:Patient and chart  HPI: Angela Mosley is a 32 y.o. female 32 y.o. female with a PMHx of idiopathic intracranial hypertension, chiari malformation, PTSD, bipolar 2 disorder rises with 2 days history of headache. Was recently diagnosed with idiopathic intracranial hypertension and was started on Diamox. Patient states that she was having small intermittent headaches until 2 days ago she is now has severe 10/10 headache. She describes her headache as constant pressure over her head worsening on laying on one side or flat and bending over. Also contains a visual symptoms, namely white line around objects as well as blurring of vision in both eyes. In the emergency room, she received a migraine cocktail with little benefit. CTV was done which showed no evidence of sinus thrombosis.    ROS: A 14 point ROS was performed and is negative except as noted in the HPI.  Past Medical History:  Diagnosis Date  . Anxiety   . Depression   . Headache   . Herpes   . Hyperemesis gravidarum   . PTSD (post-traumatic stress disorder)      Family History  Problem Relation Age of Onset  . Diabetes Mother   . Hypertension Mother   . Heart attack Mother   . Hyperlipidemia Mother   . Pulmonary embolism Mother   . Cataracts Mother   . Glaucoma Mother   . Heart attack Brother   . Diabetes Maternal Grandmother   . Diabetes Maternal Grandfather   . Heart disease Maternal Grandfather   . Prostate cancer Paternal Grandfather   . Pancreatic cancer Paternal Grandfather   . Stomach cancer Paternal Grandfather        mets all to back and lung  . Cerebral aneurysm Paternal Aunt      Social History:  reports that she quit smoking about 17 months ago. Her smoking use included cigarettes. She has a 5.00 pack-year smoking history. she has never used smokeless tobacco. She reports that she  uses drugs. Drug: Marijuana. She reports that she does not drink alcohol.   Exam: Current vital signs: BP 128/83   Pulse 78   Temp 98.9 F (37.2 C)   Resp 18   SpO2 100%  Vital signs in last 24 hours: Temp:  [98.9 F (37.2 C)] 98.9 F (37.2 C) (02/03 1203) Pulse Rate:  [78] 78 (02/03 1600) Resp:  [18] 18 (02/03 1203) BP: (128-135)/(83-90) 128/83 (02/03 1600) SpO2:  [99 %-100 %] 100 % (02/03 1600)   Physical Exam  Constitutional: Appears well-developed and well-nourished. Slightly distressed Psych: Affect appropriate to situation Eyes: No scleral injection HENT: No OP obstrucion Head: Normocephalic.  Cardiovascular: Normal rate and regular rhythm.  Respiratory: Effort normal, non-labored breathing GI: Soft.  No distension. There is no tenderness.  Skin: WDI  Neuro: Mental Status: Patient is awake, alert, oriented to person, place, month, year, and situation. Patient is able to give a clear and coherent history. No signs of aphasia or neglect Fundus: Slightly blurred margins in left eye, unable to see right eye due to non cooper Cranial Nerves: II: Visual Fields are full. Pupils are equal, round, and reactive to light.   III,IV, VI: EOMI without ptosis or diploplia.  V: Facial sensation is symmetric to temperature VII: Facial movement is symmetric.  VIII: hearing is intact to voice X: Uvula elevates symmetrically XI: Shoulder shrug is symmetric. XII: tongue is midline without atrophy or fasciculations.  Motor:  Tone is normal. Bulk is normal. 5/5 strength was present in all four extremities.  Sensory: Sensation is symmetric to light touch and temperature in the arms and legs. Deep Tendon Reflexes: 2+ and symmetric in the biceps and patellae.  Plantars: Toes are downgoing bilaterally.  Cerebellar:  FNF and HKS are intact bilaterally      I have reviewed labs in epic and the results pertinent to this consultation are:   I have reviewed the images  obtained:MRI brain and CTV   ASSESSMENT AND PLAN  Idiopathic Intracranial Hypertenision Chiari malformation  Plan Recommend Neurosurgery consultation to assess safety to performing lumbar puncture Obtain CTV: negative for sinus venous thrombosis  Continue Diamox, can increase dose to 500mg  TID   NEUROHOSPITALIST ADDENDUM Seen and examined the patient today. Formulated plan as documented above by PAC/Resident. I agree with recommendations as above. Will follow.  Georgiana SpinnerSushanth Aroor MD [facility] [phone]  If 7pm to 7am, please call on call as listed on AMION.

## 2017-08-29 NOTE — H&P (Signed)
Family Medicine Teaching [facility] Admission History and Physical Service Pager: [phone]  Patient name: Angela Mosley Medical record number: [id number] Date of birth: [birth year] Age: 32 y.o. Gender: female  Primary Care Provider: [name], MD Consultants: Neurology, neurosurgery, interventional radiology Code Status: Full  Chief Complaint: Headache  Assessment and Plan: Angela Mosley is a 32 y.o. female presenting with 3 days of worsening headache. PMH is significant for idiopathic intracranial hypertension, Chiari malformation, PTSD, obesity, marijuana substance use disorder, bipolar type II disorder, GERD  Idiopathic intracranial hypertension 3 days worsening headache likely due to recent diagnosis of idiopathic intracranial hypertension by MRI on 0 1/11.  Patient has required diagnostic/therapeutic lumbar puncture.  Neurology and neurosurgery were consulted in the ED, recommend IR drainage due to history of Chiari malformation.  Neurologist noted slightly blurred margins in left eye.  In ED, received CT venogram head negative for thrombosis and consistent with IIH.  Patient received migraine cocktail including diphenhydramine, Reglan, Toradol x2, Lorazepam with perceived minimal improvement in headache.  Patient takes Diamox 250 mg 3 times daily at home for migraine. -Admit to family practice teaching service, attending Dr. Deirdre Priest -Continue Diamox 250 mg 3 times daily, can increase to 500 mg 3 times daily per neurology recommendation -Tylenol and ibuprofen as needed for headache -Consult to IR for LP placed, likely tomorrow -N.p.o. at midnight  Bipolar disorder  PTSD Patient reports having tried Jordan in the past, however she had a poor reaction to this.  She is discontinued using it.  She uses marijuana daily for her mood. -Monitor mood  GERD Patient uses pantoprazole and Carafate at home -Continue home medications as needed  HSV type II No current  outbreak reported.  Takes Valtrex at home.  Continue as needed  FEN/GI: Regular diet Prophylaxis: Lovenox  Disposition: Observation for headache  History of Present Illness:  Angela Mosley is a 32 y.o. female presenting with 2 days of worsening headache.  Recent diagnosis of idiopathic intracranial hypertension with Cardama formation.  She took hydrocodone 5 mg / 325 mg on Friday and Tylenol thousand milligrams on Saturday.  Headache has been persistently worsening.  She endorses left blurry vision, photophobia, nausea.  Describes pain as 9 out of 10.  Says marijuana helps with headache as well.  She smokes roughly 1 g a day.  Denies any other alcohol or illicit substances.  She has had headaches like this before in the past, which were treated with therapeutic/diagnostic LP.  She says that lying upright feels better and lying on her side and standing up makes her feel dizzy.  Denies any chest pain, shortness of breath.  Review Of Systems: Per HPI with the following additions:   Review of Systems  HENT: Positive for congestion.   Eyes: Positive for blurred vision.  Respiratory: Negative for shortness of breath.   Cardiovascular: Negative for chest pain.  Gastrointestinal: Positive for constipation, diarrhea, heartburn and nausea.  Musculoskeletal: Positive for neck pain.  Neurological: Positive for dizziness and headaches.  Psychiatric/Behavioral: Positive for substance abuse.    Patient Active Problem List   Diagnosis Date Noted  . IIH (idiopathic intracranial hypertension) 08/17/2017  . Bipolar 2 disorder (HCC) 07/12/2017  . Pleuritic chest pain 06/07/2017  . Gastroesophageal reflux disease without esophagitis 06/07/2017  . URI (upper respiratory infection) 03/15/2017  . Acute gastritis without bleeding 01/12/2017  . Vaginal discharge 11/17/2016  . 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 PTSD (post-traumatic stress disorder) 02/25/2013  . MDD (major depressive  disorder), recurrent severe, without psychosis (HCC) 02/25/2013  . Cannabis abuse 02/25/2013    Past Medical History: Past Medical History:  Diagnosis Date  . Anxiety   . Depression   . Headache   . Herpes   . Hyperemesis gravidarum   . PTSD (post-traumatic stress disorder)     Past Surgical History: Past Surgical History:  Procedure Laterality Date  . MOUTH SURGERY  2005  . TUBAL LIGATION  2012    Social History: Social History   Tobacco Use  . Smoking status: Former Smoker    Packs/day: 0.50    Years: 10.00    Pack years: 5.00    Types: Cigarettes    Last attempt to quit: 03/11/2016    Years since quitting: 1.4  . Smokeless tobacco: Never Used  Substance Use Topics  . Alcohol use: No  . Drug use: Yes    Types: Marijuana   Additional social history: Lives at home with fianc mother and children.  Smokes marijuana daily.  Denies alcohol use, tobacco use. Please also refer to relevant sections of EMR.  Family History: Family History  Problem Relation Age of Onset  . Diabetes Mother   . Hypertension Mother   . Heart attack Mother   . Hyperlipidemia Mother   . Pulmonary embolism Mother   . Cataracts Mother   . Glaucoma Mother   . Heart attack Brother   . Diabetes Maternal Grandmother   . Diabetes Maternal Grandfather   . Heart disease Maternal Grandfather   . Prostate cancer Paternal Grandfather   . Pancreatic cancer Paternal Grandfather   . Stomach cancer Paternal Grandfather        mets all to back and lung  . Cerebral aneurysm Paternal Aunt    (If not completed, MUST add something in)  Allergies and Medications: Allergies  Allergen Reactions  . Hydrocodone Hives and Itching  . Latex Hives  . Latuda [Lurasidone Hcl]   . Tape Hives   No current facility-administered medications on file prior to encounter.    Current Outpatient Medications on File Prior to Encounter  Medication Sig Dispense Refill  . acetaZOLAMIDE (DIAMOX) 250 MG tablet Take 1  tablet (250 mg total) by mouth 3 (three) times daily. 90 tablet 6  . CARAFATE 1 GM/10ML suspension Take 10 mLs (1 g total) by mouth 4 (four) times daily - with meals and at bedtime. 420 mL 0  . pantoprazole (PROTONIX) 40 MG tablet Take 1 tablet (40 mg total) by mouth 2 (two) times daily. 180 tablet 3  . valACYclovir (VALTREX) 1000 MG tablet Take 1 tablet (1,000 mg total) by mouth daily. 5 tablet 5  . [DISCONTINUED] ipratropium (ATROVENT) 0.06 % nasal spray Place 2 sprays into both nostrils 4 (four) times daily. 15 mL 12  . [DISCONTINUED] promethazine (PHENERGAN) 25 MG tablet Take 1 tablet (25 mg total) by mouth every 6 (six) hours as needed for nausea or vomiting. (Patient not taking: Reported on 06/12/2014) 15 tablet 0    Objective: BP 128/83   Pulse 78   Temp 98.9 F (37.2 C)   Resp 18   SpO2 100%  Exam: Physical Exam  Constitutional: She is oriented to person, place, and time. No distress.  Obese  HENT:  Head: Normocephalic and atraumatic.  Nose: Nose normal.  Eyes: Conjunctivae and EOM are normal. Pupils are equal, round, and reactive to light. No scleral icterus.  Neck: Normal range of motion.  Mild trapezius and deltoid spasm bilaterally  Cardiovascular: Normal rate  and regular rhythm.  Respiratory: No respiratory distress.  GI: Soft. She exhibits no distension. There is no tenderness.  Musculoskeletal: Normal range of motion. She exhibits no edema.  Neurological: She is alert and oriented to person, place, and time. She exhibits normal muscle tone.  5/5 strength in upper and lower extremities throughout, sensation to light touch throughout cranial nerves II through XII grossly intact  Skin: She is not diaphoretic.    Labs and Imaging: CBC BMET  No results for input(s): WBC, HGB, HCT, PLT in the last 168 hours. No results for input(s): NA, K, CL, CO2, BUN, CREATININE, GLUCOSE, CALCIUM in the last 168 hours.   Garnette Gunner, MD 08/29/2017, 8:01 PM PGY-1, [facility]  Family Medicine FPTS Intern pager: [phone], text pages welcome  FPTS Upper-Level Resident Addendum  I have independently interviewed and examined the patient. I have discussed the above with the original author and agree with their documentation. I have made edits as necessary.   Elwanda Brooklyn, MD PGY-2, [facility] FPTS Service pager: [phone] (text pages welcome through [facility])

## 2017-08-29 NOTE — ED Notes (Signed)
Patient ambulated to the bathroom independently.  When arriving back to the room she states she is feeling fidgety, frustrated and uncomfortable.  This RN explained side effects of Reglan and made PA aware.  Patient states "I'm about to get irrate".  This RN reassured patient and attempted to comfort with warm blankets and a towel to "take your frustrations out on".

## 2017-08-29 NOTE — ED Triage Notes (Signed)
Patient complains of 3-4 days of increased headache. States she has neurologist and taking diuretics to decrease pressure in brain. Alert and oriented, NAD. No neuro deficits

## 2017-08-29 NOTE — ED Notes (Signed)
Neurology at bedside.

## 2017-08-29 NOTE — ED Provider Notes (Signed)
[facility] EMERGENCY DEPARTMENT Provider Note   CSN: [id number] Arrival date & time: 08/29/17  1148     History   Chief Complaint Chief Complaint  Patient presents with  . Headache    HPI Angela Mosley is a 32 y.o. female with a PMHx of idiopathic intracranial hypertension, chiari malformation, PTSD, bipolar 2 disorder, and other conditions listed below, who presents to the ED with complaints of gradually worsening HA x2 days.  Chart review reveals that she had an MRI brain/MRA neck on 07/31/17 (ordered by ENT due to c/o 51yrs of pulsatile tinnitus) which showed "Probable idiopathic intracranial hypertension, with abnormal tonsillar impaction of 11 mm, marked empty sella with expansion, and suspected BILATERAL papilledema. 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 This constellation of findings is most commonly associated with headache, but can also result in pulsatile tinnitus"; she was referred by ENT to neurology for this abnormal finding.  Notably, in the past during an ED visit on 06/13/16, she had a head CT which was unremarkable and an LP which revealed an opening pressure of 30 cm H20.  She was evaluated by Dr. Lucia Gaskins of [facility] on 08/17/17 following her abnormal brain MRI, who started her on Diamox 250mg  TID for IIH, and recommended she get a CTV of her head for further evaluate the transverse sinus stenosis, and be cautious with any further LPs (chose to hold off for now, due to the Chiari malformation).  Patient states that for about 2 days after starting the Diamox she had a headache but then after that she felt better, had perhaps a mild headache that was nothing significant or bothersome, until 2 days ago when her headaches started to worsen.  She describes her headache today is 10/10 constant sharp right temporal headache which radiates into the roof of her mouth and into  the right side of her neck, worse with chewing, standing, and laying completely flat, and unrelieved with hydrocodone and Tylenol.  She continues to take the Diamox as directed and has not missed any doses.  She reports associated nausea, lightheadedness with standing, blurred vision on the right eye, and seeing a white lining around objects through the left eye.  She reports that these vision changes are new as of 2 days ago.  Her PCP is Dr. Nelson Chimes at the [facility].  She denies any head injury.  Of note, she mentions that she's required IR to do LPs in the past, including the one on 06/13/16 (EDP attempted it and failed, so IR had to do it); she's been told she has "scar tissue" that interferes with the ability to get it easily.   She denies any fevers, chills, neck stiffness, loss of vision or spots in her vision, URI symptoms, ear pain or drainage, CP, SOB, abd pain, V/D/C, hematuria, dysuria, myalgias, arthralgias, numbness, tingling, focal weakness, or any other complaints at this time.    The history is provided by the patient and medical records. No language interpreter was used.  Headache   This is a recurrent problem. The current episode started 2 days ago. The problem occurs constantly. The problem has been gradually worsening. The headache is associated with an unknown factor. The pain is located in the right unilateral and temporal region. The quality of the pain is described as sharp. The pain is at a severity of 10/10. The pain is severe. The pain radiates to  the right neck. Associated symptoms include nausea. Pertinent negatives include no fever, no shortness of breath and no vomiting. She has tried acetaminophen and oral narcotic analgesics (and diamox) for the symptoms. The treatment provided no relief.    Past Medical History:  Diagnosis Date  . Anxiety   . Depression   . Headache   . Herpes   . Hyperemesis gravidarum   . PTSD (post-traumatic stress  disorder)     Patient Active Problem List   Diagnosis Date Noted  . IIH (idiopathic intracranial hypertension) 08/17/2017  . Bipolar 2 disorder (HCC) 07/12/2017  . Pleuritic chest pain 06/07/2017  . Gastroesophageal reflux disease without esophagitis 06/07/2017  . URI (upper respiratory infection) 03/15/2017  . Acute gastritis without bleeding 01/12/2017  . Vaginal discharge 11/17/2016  . 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 PTSD (post-traumatic stress disorder) 02/25/2013  . 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 Cannabis abuse 02/25/2013    Past Surgical History:  Procedure Laterality Date  . MOUTH SURGERY  2005  . TUBAL LIGATION  2012    OB History    Gravida Para Term Preterm AB Living   5 5 5  0 0 5   SAB TAB Ectopic Multiple Live Births   0 0 0 0 5       Home Medications    Prior to Admission medications   Medication Sig Start Date End Date Taking? Authorizing Provider  acetaZOLAMIDE (DIAMOX) 250 MG tablet Take 1 tablet (250 mg total) by mouth 3 (three) times daily. 08/17/17   Anson Fret, MD  CARAFATE 1 GM/10ML suspension Take 10 mLs (1 g total) by mouth 4 (four) times daily - with meals and at bedtime. 07/01/17   [name], MD  pantoprazole (PROTONIX) 40 MG tablet Take 1 tablet (40 mg total) by mouth 2 (two) times daily. 08/09/17   Rachael Fee, MD  valACYclovir (VALTREX) 1000 MG tablet Take 1 tablet (1,000 mg total) by mouth daily. 07/01/17   [name], MD  ipratropium (ATROVENT) 0.06 % nasal spray Place 2 sprays into both nostrils 4 (four) times daily. 08/30/14 12/12/14  Reuben Likes, MD  promethazine (PHENERGAN) 25 MG tablet Take 1 tablet (25 mg total) by mouth every 6 (six) hours as needed for nausea or vomiting. Patient not taking: Reported on 06/12/2014 12/18/13 12/12/14  Jaynie Crumble, PA-C    Family History Family History  Problem Relation Age of Onset  . Diabetes  Mother   . Hypertension Mother   . Heart attack Mother   . Hyperlipidemia Mother   . Pulmonary embolism Mother   . Cataracts Mother   . Glaucoma Mother   . Heart attack Brother   . Diabetes Maternal Grandmother   . Diabetes Maternal Grandfather   . Heart disease Maternal Grandfather   . Prostate cancer Paternal Grandfather   . Pancreatic cancer Paternal Grandfather   . Stomach cancer Paternal Grandfather        mets all to back and lung  . Cerebral aneurysm Paternal Aunt     Social History Social History   Tobacco Use  . Smoking status: Former Smoker    Packs/day: 0.50    Years: 10.00    Pack years: 5.00    Types: Cigarettes    Last attempt to quit: 03/11/2016    Years since quitting: 1.4  . Smokeless tobacco: Never Used  Substance Use Topics  . Alcohol use: No  . Drug use:  Yes    Types: Marijuana     Allergies   Hydrocodone; Latex; Latuda [lurasidone hcl]; and Tape   Review of Systems Review of Systems  Constitutional: Negative for chills and fever.  HENT: Negative for congestion, ear discharge, ear pain, sinus pressure and sore throat.   Eyes: Positive for photophobia and visual disturbance.  Respiratory: Negative for cough and shortness of breath.   Cardiovascular: Negative for chest pain.  Gastrointestinal: Positive for nausea. Negative for abdominal pain, constipation, diarrhea and vomiting.  Genitourinary: Negative for dysuria and hematuria.  Musculoskeletal: Positive for neck pain. Negative for arthralgias and neck stiffness.  Skin: Negative for color change.  Allergic/Immunologic: Negative for immunocompromised state.  Neurological: Positive for light-headedness (with standing) and headaches. Negative for weakness and numbness.  Psychiatric/Behavioral: Negative for confusion.   All other systems reviewed and are negative for acute change except as noted in the HPI.    Physical Exam Updated Vital Signs BP 135/90   Pulse 78   Temp 98.9 F (37.2 C)    Resp 18   SpO2 99%   Physical Exam  Constitutional: She is oriented to person, place, and time. Vital signs are normal. She appears well-developed and well-nourished.  Non-toxic appearance. No distress.  Afebrile, nontoxic, NAD although appears slightly uncomfortable and very photophobic  HENT:  Head: Normocephalic and atraumatic.  Mouth/Throat: Oropharynx is clear and moist and mucous membranes are normal.  Eyes: Conjunctivae and EOM are normal. Pupils are equal, round, and reactive to light. Right eye exhibits no discharge. Left eye exhibits no discharge.  PERRL, EOMI, no nystagmus, unable to visualize fundus due to photophobia and nondilated exam  Visual acuity (with glasses): Right Eye Distance: 20/40 Left Eye Distance: 20/50 Bilateral Distance: 20/32  Neck: Normal range of motion. Neck supple. Muscular tenderness present. No spinous process tenderness present. No neck rigidity. Normal range of motion present.  FROM intact without spinous process TTP, no bony stepoffs or deformities, with mild diffuse b/l paraspinous muscle TTP and slight muscle spasms. No rigidity or meningeal signs. No bruising or swelling.   Cardiovascular: Normal rate, regular rhythm, normal heart sounds and intact distal pulses. Exam reveals no gallop and no friction rub.  No murmur heard. Pulmonary/Chest: Effort normal and breath sounds normal. No respiratory distress. She has no decreased breath sounds. She has no wheezes. She has no rhonchi. She has no rales.  Abdominal: Soft. Normal appearance and bowel sounds are normal. She exhibits no distension. There is no tenderness. There is no rigidity, no rebound, no guarding, no CVA tenderness, no tenderness at McBurney's point and negative Murphy's sign.  Musculoskeletal: Normal range of motion.  MAE x4 Strength and sensation grossly intact in all extremities Distal pulses intact Gait steady  Neurological: She is alert and oriented to person, place, and time. She  has normal strength. No cranial nerve deficit or sensory deficit. Coordination and gait normal. GCS eye subscore is 4. GCS verbal subscore is 5. GCS motor subscore is 6.  CN 2-12 grossly intact A&O x4 GCS 15 Sensation and strength intact Gait nonataxic including with tandem walking Coordination with finger-to-nose WNL Neg pronator drift   Skin: Skin is warm, dry and intact. No rash noted.  Psychiatric: She has a normal mood and affect.  Nursing note and vitals reviewed.    ED Treatments / Results  Labs (all labs ordered are listed, but only abnormal results are displayed) Labs Reviewed  I-STAT BETA HCG BLOOD, ED ([facility], [facility], [facility] ONLY)  EKG  EKG Interpretation None       Radiology Ct Venogram Head  Result Date: 08/29/2017 CLINICAL DATA:  Suspected idiopathic intracranial hypertension. Increasing headaches for 3-4 days. EXAM: CT VENOGRAM HEAD TECHNIQUE: Noncontrast exam of the head was followed by post infusion imaging, and multiplanar examination of the venous sinuses. CONTRAST:  75mL ISOVUE-300 IOPAMIDOL (ISOVUE-300) INJECTION 61% COMPARISON:  MRI and MRV 07/31/2017. FINDINGS: No evidence for acute infarction, hemorrhage, mass lesion, hydrocephalus, or extra-axial fluid. Normal cerebral volume. No white matter disease. Partial empty sella.  Approximate 1 cm tonsillar herniation. Post infusion imaging of the head demonstrates no areas of venous sinus thrombosis. IMPRESSION: No evidence for venous sinus thrombosis. Signs of idiopathic intracranial hypertension similar to prior MR. Electronically Signed   By: Elsie Stain M.D.   On: 08/29/2017 17:32     MRI Brain/MRA neck 07/31/17: Study Result  CLINICAL DATA:  RIGHT-sided hearing loss.  Pulsatile tinnitus.  EXAM: MRI HEAD WITHOUT AND WITH CONTRAST  MRA NECK WITHOUT AND WITH CONTRAST  TECHNIQUE: Multiplanar, multiecho pulse sequences of the brain and surrounding structures were obtained without and with intravenous  contrast. Angiographic images of the neck were obtained using MRA technique with and without intravenous contrast. Carotid stenosis measurements (when applicable) are obtained utilizing NASCET criteria, using the distal internal carotid diameter as the denominator.  CONTRAST:  20mL MULTIHANCE GADOBENATE DIMEGLUMINE 529 MG/ML IV SOLN  COMPARISON:  CT head 06/13/2016. Symptoms at that time were headache and blurred vision.  FINDINGS: MRI HEAD FINDINGS  Brain: No evidence for acute infarction, hemorrhage, mass lesion, hydrocephalus, or extra-axial fluid. Normal cerebral volume. No white matter disease.  Several signs consistent with idiopathic intracranial hypertension are present. There is abnormal tonsillar descent and impaction below the foramen magnum measuring 11 mm. There is marked empty sella with expansion. There is slight prominence of the optic nerve CSF sheaths and suspected BILATERAL papilledema.  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 See coronal image 12, series 15 and 16. No abnormal intracranial enhancement of the brain or meninges is observed  Thin-section imaging through the internal auditory canals does not demonstrate by retrocochlear lesion such as vestibular schwannoma. There is no vascular loop. No venous sinus thrombosis is evident.  Vascular: Normal flow voids.  Skull and upper cervical spine: Normal marrow signal. Abnormal impaction of the cerebellar tonsils into the foramen magnum and upper cervical canal, greater on the RIGHT.  Sinuses/Orbits: No layering sinus fluid. See orbital discussion above.  Other: None.  MRA NECK FINDINGS  Conventional branching of the great vessels from the arch. No proximal stenosis. Carotid bifurcations are free of disease including stenosis or dissection. No abnormal enlargement of the external carotid arteries. Both  vertebral arteries are patent, LEFT dominant, and contribute to intracranial formation of the basilar.  IMPRESSION: Probable idiopathic intracranial hypertension, with abnormal tonsillar impaction of 11 mm, marked empty sella with expansion, and suspected BILATERAL papilledema. 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 This constellation of findings is most commonly associated with headache, but can also result in pulsatile tinnitus.  No retrocochlear lesion.  Consider neurology consultation, and confirmation with lumbar puncture for measurement of opening pressure.  No extracranial vascular abnormality is detected.   Electronically Signed   By: Elsie Stain M.D.   On: 07/31/2017 11:25     Procedures Procedures (including critical care time)  Medications Ordered in ED Medications  LORazepam (ATIVAN) injection 1 mg (0 mg Intravenous Hold 08/29/17 1652)  metoCLOPramide (REGLAN) injection 10 mg (10 mg Intravenous Given 08/29/17 1602)    And  diphenhydrAMINE (BENADRYL) injection 25 mg (25 mg Intravenous Given 08/29/17 1557)    And  sodium chloride 0.9 % bolus 1,000 mL (0 mLs Intravenous Stopped 08/29/17 1901)    And  ketorolac (TORADOL) 30 MG/ML injection 30 mg (30 mg Intravenous Given 08/29/17 1559)  diphenhydrAMINE (BENADRYL) injection 25 mg (25 mg Intravenous Given 08/29/17 1618)  iopamidol (ISOVUE-300) 61 % injection (75 mLs  Contrast Given 08/29/17 1652)  ketorolac (TORADOL) 30 MG/ML injection 30 mg (30 mg Intravenous Given 08/29/17 1901)     Initial Impression / Assessment and Plan / ED Course  I have reviewed the triage vital signs and the nursing notes.  Pertinent labs & imaging results that were available during my care of the patient were reviewed by me and considered in my medical decision making (see chart for details).     32 y.o. female here with worsening HA x2 days and new vision changes. Pt  has hx of recently diagnosed IIH and chiari malformation, was started on diamox on 08/17/17. States she had a headche for 2 days after starting that med, but then was alright with only mild headache until 2 days ago when she started having a more severe headache. On exam, mild paracervical muscle TTP, no focal neuro deficits, photophobic but in NAD. Unable to visualize fundus due to pt discomfort with bright light as well as due to nondilated exam. Will get visual acuity done, and likely touch base with neurology to see what recommendations they may have; will hold off on migraine cocktail in case this would interfere with anything neurology would want done. Discussed case with my attending Dr. Ranae Palms who agrees with plan.   2:33 PM Dr. Laurence Slate of neurology returning page and recommends trying migraine cocktail (benadryl/reglan/toradol/fluids) and seeing how she does with that. Doesn't feel she needs labs at this time, will get [facility] to ensure no pregnancy but otherwise we'll hold off on anything else for now. Awaiting visual acuity still. Will reassess shortly.   4:03 PM BetaHCG negative. Visual acuity fairly well preserved at 20/40 R, 20/50 L, 20/32 bilateral. Pt just now receiving migraine cocktail due to delays in getting IV started. Dr. Laurence Slate of neurology calling back, states he'd like to go ahead and get the CT venogram head that neurology ordered outpatient; would also like CT head. He will come down and see pt after migraine cocktail is given and after these results come back. Will reassess shortly.   4:20 PM Pt having somewhat dystonic reaction to reglan, very anxious and pacing around room; no tremors or jerking movements noted. Will give more benadryl and encouraged pt to try to remain calm. Also, CT tech calling stating that CT venogram will include a CT head without, so she will cancel the CT head order. Will reassess shortly.   4:33 PM Pt still having significant anxiety from reglan  reaction; will give ativan and reassess.   6:56 PM CT venogram head with signs of IIH similar to prior MRI, but otherwise no acute findings and no venous sinus thrombosis. Pt with ongoing 8/10 headache still, though the nausea has improved and she no longer has the reaction symptoms from reglan; Dr. Laurence Slate recommends more toradol, wants to avoid morphine. Dr. Laurence Slate down to see pt and initially wanted her admitted, then just came  down and asked that we touch base with NSG to see if pt can even have an LP due to the chiari malformation; if she can then she would likely need to be admitted for the LP since she's required IR to perform them in the past. Will contact NSG to discuss case, and then likely admit for intractable headache and potentially for the LP depending on what NSG says.   7:48 PM Dr. Yetta BarreJones of NSG returning page, states as long as she doesn't have vision loss, she doesn't need emergent LP; could do decadron and diamox; regarding the chiari malformation, the LP could cause some worsening if you took too much CSF; would only recommend ~553mL drainage, to get an accurate opening pressure and to see if that helped her headache, but not much more than that. Pt still having headache, given neurology recommendations of admission for LP, will proceed with this option, and for control of her headache.   7:58 PM [facility] and will admit. Holding orders to be placed by admitting team. Please see their notes for further documentation of care. I appreciate their help with this pleasant pt's care. Pt stable at time of admission.    Final Clinical Impressions(s) / ED Diagnoses   Final diagnoses:  Bad headache  Visual disturbance  Nausea  Lightheadedness  Idiopathic intracranial hypertension    ED Discharge Orders    [address]       Charise Leinbach, [place], [place]PA-C 08/29/17 [address]1956    Andreanna Mikolajczak, [place], [place]PA-C 08/30/17 0159    Loren RacerYelverton, David, MD 09/02/17 [phone]

## 2017-08-29 NOTE — ED Notes (Addendum)
Patient stating she is still very uncomfortable, and now states she would like to "just go home".  This RN explained this would not be a good thing for her in this situation and PA and primary RN made aware

## 2017-08-30 ENCOUNTER — Observation Stay (HOSPITAL_COMMUNITY): Payer: Medicaid Other

## 2017-08-30 ENCOUNTER — Other Ambulatory Visit: Payer: Self-pay | Admitting: Diagnostic Radiology

## 2017-08-30 DIAGNOSIS — T450X5A Adverse effect of antiallergic and antiemetic drugs, initial encounter: Secondary | ICD-10-CM | POA: Diagnosis present

## 2017-08-30 DIAGNOSIS — R42 Dizziness and giddiness: Secondary | ICD-10-CM

## 2017-08-30 DIAGNOSIS — E669 Obesity, unspecified: Secondary | ICD-10-CM | POA: Diagnosis present

## 2017-08-30 DIAGNOSIS — H539 Unspecified visual disturbance: Secondary | ICD-10-CM | POA: Diagnosis not present

## 2017-08-30 DIAGNOSIS — Z91040 Latex allergy status: Secondary | ICD-10-CM | POA: Diagnosis not present

## 2017-08-30 DIAGNOSIS — G935 Compression of brain: Secondary | ICD-10-CM | POA: Diagnosis not present

## 2017-08-30 DIAGNOSIS — Z87891 Personal history of nicotine dependence: Secondary | ICD-10-CM | POA: Diagnosis not present

## 2017-08-30 DIAGNOSIS — G932 Benign intracranial hypertension: Principal | ICD-10-CM

## 2017-08-30 DIAGNOSIS — F3181 Bipolar II disorder: Secondary | ICD-10-CM | POA: Diagnosis present

## 2017-08-30 DIAGNOSIS — Z83511 Family history of glaucoma: Secondary | ICD-10-CM | POA: Diagnosis not present

## 2017-08-30 DIAGNOSIS — Z833 Family history of diabetes mellitus: Secondary | ICD-10-CM | POA: Diagnosis not present

## 2017-08-30 DIAGNOSIS — R51 Headache: Secondary | ICD-10-CM

## 2017-08-30 DIAGNOSIS — Y92230 Patient room in hospital as the place of occurrence of the external cause: Secondary | ICD-10-CM | POA: Diagnosis present

## 2017-08-30 DIAGNOSIS — Z8249 Family history of ischemic heart disease and other diseases of the circulatory system: Secondary | ICD-10-CM | POA: Diagnosis not present

## 2017-08-30 DIAGNOSIS — R200 Anesthesia of skin: Secondary | ICD-10-CM | POA: Diagnosis not present

## 2017-08-30 DIAGNOSIS — Z885 Allergy status to narcotic agent status: Secondary | ICD-10-CM | POA: Diagnosis not present

## 2017-08-30 DIAGNOSIS — Z888 Allergy status to other drugs, medicaments and biological substances status: Secondary | ICD-10-CM | POA: Diagnosis not present

## 2017-08-30 DIAGNOSIS — K219 Gastro-esophageal reflux disease without esophagitis: Secondary | ICD-10-CM | POA: Diagnosis present

## 2017-08-30 DIAGNOSIS — R110 Nausea: Secondary | ICD-10-CM

## 2017-08-30 DIAGNOSIS — A6000 Herpesviral infection of urogenital system, unspecified: Secondary | ICD-10-CM | POA: Diagnosis present

## 2017-08-30 DIAGNOSIS — G2402 Drug induced acute dystonia: Secondary | ICD-10-CM | POA: Diagnosis present

## 2017-08-30 DIAGNOSIS — Z8349 Family history of other endocrine, nutritional and metabolic diseases: Secondary | ICD-10-CM | POA: Diagnosis not present

## 2017-08-30 DIAGNOSIS — Z6837 Body mass index (BMI) 37.0-37.9, adult: Secondary | ICD-10-CM | POA: Diagnosis not present

## 2017-08-30 DIAGNOSIS — G43909 Migraine, unspecified, not intractable, without status migrainosus: Secondary | ICD-10-CM | POA: Diagnosis present

## 2017-08-30 LAB — CBC
HEMATOCRIT: 33.2 % — AB (ref 36.0–46.0)
HEMOGLOBIN: 11 g/dL — AB (ref 12.0–15.0)
MCH: 31.1 pg (ref 26.0–34.0)
MCHC: 33.1 g/dL (ref 30.0–36.0)
MCV: 93.8 fL (ref 78.0–100.0)
Platelets: 274 10*3/uL (ref 150–400)
RBC: 3.54 MIL/uL — AB (ref 3.87–5.11)
RDW: 13.2 % (ref 11.5–15.5)
WBC: 5.9 10*3/uL (ref 4.0–10.5)

## 2017-08-30 LAB — CREATININE, SERUM: Creatinine, Ser: 0.62 mg/dL (ref 0.44–1.00)

## 2017-08-30 LAB — TSH: TSH: 0.555 u[IU]/mL (ref 0.350–4.500)

## 2017-08-30 LAB — HIV ANTIBODY (ROUTINE TESTING W REFLEX): HIV SCREEN 4TH GENERATION: NONREACTIVE

## 2017-08-30 MED ORDER — SODIUM CHLORIDE 0.9 % IV BOLUS (SEPSIS)
500.0000 mL | Freq: Once | INTRAVENOUS | Status: AC
  Administered 2017-08-30: 500 mL via INTRAVENOUS

## 2017-08-30 MED ORDER — LIDOCAINE HCL (PF) 1 % IJ SOLN
5.0000 mL | Freq: Once | INTRAMUSCULAR | Status: AC
  Administered 2017-08-30: 3 mL via INTRADERMAL

## 2017-08-30 MED ORDER — ONDANSETRON HCL 4 MG/2ML IJ SOLN
4.0000 mg | Freq: Three times a day (TID) | INTRAMUSCULAR | Status: DC | PRN
  Administered 2017-08-30 – 2017-08-31 (×2): 4 mg via INTRAVENOUS
  Filled 2017-08-30 (×2): qty 2

## 2017-08-30 MED ORDER — LIDOCAINE HCL (PF) 1 % IJ SOLN
INTRAMUSCULAR | Status: AC
  Administered 2017-08-30: 3 mL via INTRADERMAL
  Filled 2017-08-30: qty 5

## 2017-08-30 MED ORDER — KETOROLAC TROMETHAMINE 30 MG/ML IJ SOLN
30.0000 mg | Freq: Four times a day (QID) | INTRAMUSCULAR | Status: DC | PRN
  Administered 2017-08-30 – 2017-08-31 (×2): 30 mg via INTRAVENOUS
  Filled 2017-08-30 (×2): qty 1

## 2017-08-30 MED ORDER — KETOROLAC TROMETHAMINE 30 MG/ML IJ SOLN
30.0000 mg | Freq: Once | INTRAMUSCULAR | Status: AC
  Administered 2017-08-30: 30 mg via INTRAVENOUS
  Filled 2017-08-30: qty 1

## 2017-08-30 MED ORDER — RAMELTEON 8 MG PO TABS
8.0000 mg | ORAL_TABLET | Freq: Every day | ORAL | Status: DC
  Administered 2017-08-30: 8 mg via ORAL
  Filled 2017-08-30 (×2): qty 1

## 2017-08-30 MED ORDER — DIPHENHYDRAMINE HCL 25 MG PO CAPS
25.0000 mg | ORAL_CAPSULE | Freq: Once | ORAL | Status: AC
  Administered 2017-08-30: 25 mg via ORAL
  Filled 2017-08-30: qty 1

## 2017-08-30 NOTE — Progress Notes (Signed)
FPTS Interim Progress Note  Received page informing that patient vomited yellow liquid, received Zofran. On evaluation, patient is neurologically intact, speaking appropriately and able to walk to bathroom without assistance. Headache persists, receiving Toradol as needed.   O: BP (!) 102/53 (BP Location: Left Arm)   Pulse 60   Temp 98.2 F (36.8 C) (Oral)   Resp 18   Ht 5\' 2"  (1.575 m)   Wt 204 lb (92.5 kg)   LMP 08/23/2017 (Exact Date)   SpO2 98%   BMI 37.31 kg/m    A/P: Continue current management.  Ellwood DenseRumball, Dardan Shelton, DO 08/30/2017, 10:42 PM PGY-1, [facility] pager [phone]

## 2017-08-30 NOTE — Progress Notes (Signed)
Family Medicine Teaching Service Daily Progress Note Intern Pager: [phone]  Patient name: Angela FredricksonJeronae Meidinger Medical record number: [id number] Date of birth: [birth year] Age: 32 y.o. Gender: female  Primary Care Provider: Freddrick MarchAmin, Yashika, MD Consultants: Neurology, IR Code Status: Full  Pt Overview and Major Events to Date:  Angela Mosley is a 32 y.o. female presenting with 3 days of worsening headache. PMH is significant for idiopathic intracranial hypertension, Chiari malformation, PTSD, obesity, marijuana substance use disorder, bipolar type II disorder, GERD  Assessment and Plan:  Idiopathic Intracranial Hypertension: Acute. Stable. 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 Her CT venogram was negative for thrombosis further supporting diagnosis of IIH causing her current symptoms. - Continue Diamox 250mg  TID, consider increasing to 500mg  before discharge per Neurology - per H&P Neurosurgery was consulted but no note, so consult to neurosurgery today - Tylenol and Ibuprofen PRN for headache - Awaiting LP from IR today - Consider Valproate for headache prophylaxis - Could also consider adding furosemide to help with headache prophylaxis  Bipolar Disorder: Chronic. No current symptoms this admission. Patient self medicates with marijuana. Says she has taken Latuda in the past but had a bad reaction. - monitor mood - consider Seroquel for maintenance if patient is agreeable or if mood deteriorates  GERD: Chronic. Patient symptoms are well controlled on home regimen. - Continue Pantoprazole 40mg  BID - Continue Carafate 2g BID  Poor Sleep: Acute. Patient states she had poor sleep and is tired but cannot rest due to headache. She states she usually does not have this problem but started in the hospital.  - melatonin to help with sleep  FEN/GI: Reg diet PPx: Lovenox  Disposition: stable  Subjective:  Patient  states today her headache is the same. She is also still having blurry vision more in her left eye than her right that has been unchanged since admission. She states she is getting dizzy whenever she sits up or stands and it makes her headache worse. She only had short mild relief with Toradol.  Objective: Temp:  [97.8 F (36.6 C)-100 F (37.8 C)] 100 F (37.8 C) (02/04 0539) Pulse Rate:  [65-89] 65 (02/04 0539) Resp:  [16-18] 16 (02/04 0539) BP: (105-135)/(52-92) 111/67 (02/04 0539) SpO2:  [99 %-100 %] 100 % (02/04 0539) Weight:  [204 lb (92.5 kg)] 204 lb (92.5 kg) (02/03 2322) Physical Exam: Gen: Alert and Oriented x 3, NAD HEENT: Normocephalic, atraumatic, PERRLA, EOMI CV: RRR, no murmur, normal S1, S2 split, +2 pulses dorsalis pedis bilaterally Resp: CTAB, no wheezing, rales, or rhonchi, comfortable work of breathing Abd: non-distended, non-tender, soft, +bs in all four quadrants MSK: FROM in all four extremities Ext: no clubbing, cyanosis, or edema Neuro: CN II-XII intact, no focal or gross deficits Skin: warm, dry, intact, no rashes Psych: appropriate behavior, mood, denies any suicidal ideation  Laboratory: Recent Labs  Lab 08/29/17 2327  WBC 5.9  HGB 11.0*  HCT 33.2*  PLT 274   Recent Labs  Lab 08/29/17 2327  CREATININE 0.62   2/3 - beta hCG: <5.0 2/3 - HIV antibody: pending  Imaging/Diagnostic Tests:  2/3 - CT Venogram: FINDINGS: No evidence for acute infarction, hemorrhage, mass lesion, hydrocephalus, or extra-axial fluid. Normal cerebral volume. No white matter disease.  Partial empty sella.  Approximate 1 cm tonsillar herniation.  Post infusion imaging of the head demonstrates no areas of venous sinus thrombosis.  2/4 - Lumbar Puncture: Pending  IMPRESSION:  No evidence for venous sinus thrombosis.  Signs of idiopathic intracranial hypertension similar to prior MR.  Arlyce Harman, DO 08/30/2017, 6:59 AM PGY-1, [facility]  [facility] FPTS Intern pager: [phone], text pages welcome

## 2017-08-30 NOTE — Progress Notes (Signed)
Family Practice Teaching Service Interval Progress Note  Noted that on LP today patient had 25 ml of CSF drawn by interventional radiology. Per ED PA note, neurosurgery had recommended on the phone to limit CSF removed to 3 ml.  I called and spoke with Dr. Amada JupiterKirkpatrick of neurology regarding this excess of CSF drawn, and whether this poses harm to patient and whether patient requires additional imaging tonight to evaluate for herniation. In his opinion this did not cause patient any harm, as her CSF was communicating above and below the tonsils. Additionally patient showed no signs of new neurological compromise after LP. He did not feel patient needed additional imaging tonight.  I stopped by patient's room around 5:30p to check on her. She reports continued headache and vision changes but otherwise is unchanged. Patient is fully alert and coherent, following commands, in no distress.  Has outpatient neurosurgery appointment scheduled for next week. Will need ophtho evaluation outpatient prior to that visit.  Greatly appreciate neurology team's assistance in caring for Angela Mosley.   Lavonta Tillis, MD

## 2017-08-30 NOTE — Progress Notes (Signed)
FPTS Interim Progress Note  Called Neurosurgery and Dr. Yetta BarreJones returned my call to discuss need for intervention while in hospital. Dr. Yetta BarreJones said nothing urgent that Neurosurgery would need to do unless she begins to lose vision while in the hospital. She has outpatient follow up with Neurosurgery. Dr. Yetta BarreJones stated we could do LP with 3mm fluid draw to help diagnose Pseudotumor Cerebri.    A/P: Possible IIH: LP was performed but due to no mention of limit of amount of fluid to be drawn off more than the normal 3mm was taken. LP did show elevated pressure but if was not a very impressive elevation and could be falsely elevated due to LP being done in prone position.   Spoke to neurology who feels after LP result Pseudotumor Cerebri is less likely and now etiology of headache is unclear. Neurology is going to possible consult opthalmology to determine if inpatient consult is needed. Appreciate recs from both Neurosurgery and Neurology and will continue to manage headache symptoms.  Arlyce HarmanLockamy, Hafsa Lohn, DO 08/30/2017, 9:35 PM PGY-1, [facility] pager [phone]

## 2017-08-30 NOTE — Progress Notes (Signed)
Subjective: Patient has just returned from her lumbar puncture that was done by interventional radiology.  She still states in her left eye there is blurred vision with a white halo around the contour of my body.  She states that she is having a 7/10 headache right now but appeared to have worsening of headache during the lumbar puncture.  At this point it has evened out.  She is laying flat for the next 2 hours secondary to the lumbar puncture just recently obtained.  Exam: Vitals:   08/30/17 0539 08/30/17 1100  BP: 111/67 115/81  Pulse: 65 73  Resp: 16 18  Temp: 100 F (37.8 C)   SpO2: 100% 100%    Physical Exam   HEENT-  Normocephalic, no lesions, without obvious abnormality.  Normal external eye and conjunctiva.   Cardiovascular- S1-S2 audible, pulses palpable throughout   Lungs-no rhonchi or wheezing noted, no excessive working breathing.  Saturations within normal limits Abdomen- All 4 quadrants palpated and nontender Extremities- Warm, dry and intact Musculoskeletal-no joint tenderness, deformity or swelling Skin-warm and dry, no hyperpigmentation, vitiligo, or suspicious lesions    Neuro:  Mental Status: Alert, oriented, thought content appropriate.  Speech fluent without evidence of aphasia.  Able to follow 3 step commands without difficulty. Cranial Nerves: II:  Visual fields grossly normal,  III,IV, VI: ptosis not present, extra-ocular motions intact bilaterally pupils equal, round, reactive to light and accommodation--no ADP was noted, no 6th nerve palsy was noted V,VII: smile symmetric, facial light touch sensation normal bilaterally VIII: hearing normal bilaterally IX,X: uvula rises symmetrically XI: bilateral shoulder shrug XII: midline tongue extension Motor: Right : Upper extremity   5/5    Left:     Upper extremity   5/5  Lower extremity   5/5     Lower extremity   5/5 Tone and bulk:normal tone throughout; no atrophy noted Sensory: Pinprick and light touch  intact throughout, bilaterally Deep Tendon Reflexes: 2+ and symmetric throughout Plantars: Right: downgoing   Left: downgoing Cerebellar: normal finger-to-nose, normal rapid alternating movements and normal heel-to-shin test     Medications:  Prior to Admission:  Medications Prior to Admission  Medication Sig Dispense Refill Last Dose  . acetaminophen (TYLENOL) 500 MG tablet Take 1,000 mg by mouth every 6 (six) hours as needed (for headaches).   Past Week at Unknown time  . acetaZOLAMIDE (DIAMOX) 250 MG tablet Take 1 tablet (250 mg total) by mouth 3 (three) times daily. 90 tablet 6 08/27/2017 at Unknown time  . CARAFATE 1 GM/10ML suspension Take 10 mLs (1 g total) by mouth 4 (four) times daily - with meals and at bedtime. 420 mL 0 08/27/2017 at Unk  . pantoprazole (PROTONIX) 40 MG tablet Take 1 tablet (40 mg total) by mouth 2 (two) times daily. 180 tablet 3 08/28/2017 at Unknown time  . valACYclovir (VALTREX) 1000 MG tablet Take 1 tablet (1,000 mg total) by mouth daily. (Patient taking differently: Take 1,000 mg by mouth daily as needed (for flares). ) 5 tablet 5 Unk at Unk   Scheduled: . acetaZOLAMIDE  250 mg Oral TID WC  . enoxaparin (LOVENOX) injection  40 mg Subcutaneous Daily  . LORazepam  1 mg Intravenous Once  . pantoprazole  40 mg Oral BID  . ramelteon  8 mg Oral QHS  . sucralfate  2 g Oral BID WC  . valACYclovir  1,000 mg Oral Daily   Continuous:   Pertinent Labs/Diagnostics: Lumbar puncture: clear colorless CSF with an opening pressure  of 24 cm water. Closing pressure was 0 cm of water. 25.5 ml of CSF were obtained for laboratory studies.  Ct Venogram Head  Result Date: 08/29/2017  IMPRESSION: No evidence for venous sinus thrombosis. Signs of idiopathic intracranial hypertension similar to prior MR. Electronically Signed   By: Elsie Stain M.D.   On: 08/29/2017 17:32   Dg Fluoro Guide Lumbar Puncture  Result Date: 08/30/2017  IMPRESSION: Therapeutic lumbar puncture  performed without complication. Electronically Signed   By: Francene Boyers M.D.   On: 08/30/2017 10:32     Felicie Morn PA-C [facility] [phone]  Have seen the patient and reviewed the above note.  Of note, her headaches are markedly positional, worse with sitting not better with laying down.  She states that it was this way prior to starting Diamox, but got worse after starting Diamox.  Her headaches have been worsening, and due to concern for ICPs, she had an LP and she states that her headache became much worse during the LP, same character, still positional.  I suspect that much of the vision change is diplopia.  Impression:  32 year old female with positional headaches, worsened with Diamox and lumbar puncture.  Her lumbar puncture today was done in the prone position, which if she is putting pressure on her belly can falsely elevate the ICP.  Unclear what to make of her previous elevation at 30.  With the symptoms, and the timing of her worsening, I am very hesitant about the diagnosis of pseudotumor.  She needs an ophthalmologic examination I do wonder  Recommendations: 1) discontinue Diamox 2) ophthalmologic examination prior to seeing Dr. Conchita Paris on Monday 3) will give a small fluid bolus 500 mL x1  Ritta Slot, MD [facility] [phone]  If 7pm- 7am, please page neurology on call as listed in AMION.  08/30/2017, 1:10 PM

## 2017-08-30 NOTE — Discharge Summary (Signed)
Family Medicine Teaching [facility] Discharge Summary  Patient name: Angela Mosley Medical record number: [id number] Date of birth: Dec 09, 1985 Age: 32 y.o. Gender: female Date of Admission: 08/29/2017  Date of Discharge: 08/31/2017 Admitting Physician: Carney LivingMarshall L Chambliss, MD  Primary Care Provider: Freddrick MarchAmin, Yashika, MD Consultants: Neurology  Indication for Hospitalization:   Idiopathic Intracranial Hypertension  Discharge Diagnoses/Problem List:   Idiopathic Intracranial Hypertension Chiari Malformation Bipolar Type II GERD Herpes Simplex 2  Disposition: home  Discharge Condition: stable  Discharge Exam:   Gen: Alert and Oriented x 3, NAD HEENT: Normocephalic, atraumatic, PERRLA, EOMI, diffusely TTP CV: RRR, no murmur, normal S1, S2 split, +2 pulses dorsalis pedis bilaterally Resp: CTAB, no wheezing, rales, or rhonchi, comfortable work of breathing Abd: non-distended, non-tender, soft, +bs in all four quadrants MSK: neck and trapezius tightness and tenderness to palpation; FROM in all four extremities Ext: no clubbing, cyanosis, or edema Neuro: CN II-XII intact, no focal or gross deficits Skin: warm, dry, intact, no rashes  Brief Hospital Course:  Angela Mosley is a 32y/o female with PMH of Idiopathic Intracranial Hypertension, Bipolar Type 2, GERD, and Herpes Simplex 2 who presented with an intractable headache. A CT venogram was done to rule out venous thromboembolism as a potential cause of her symptoms and was negative. Neurology was consulted and recommended a lumbar puncture which revealed an opening pressure of 24cm, closing pressure of 0 cm. A total of 25.5cm of water was obtained. Her headache and visual symptoms did not improve after her lumbar puncture. Neurology felt like her current symptoms were unlikely to be due to Idiopathic Intracranial Hypertension and could be due to Chiari Malformation. Neurology recommended opthomalogy outpatient and  neurosurgery outpatient for consultation on management of her Chiari malformation.  Neurosurgery was called and stated they would not need to see her inpatient unless she was losing her vision, which did not occur while she was in the hospital. and Set up an outpatient appointment for her on 2/11. They also recommended she get an outpatient opthalmology appointment to explore surgical options to protect her vision, such as optic nerve sheath fenestration.  On day of discharge after receiving one dose of Tramadol her headache had improved while laying down and with rest. She was discharged on a 10 day supply of oxycodone 7.5mg  and acetaminophen.  Issues for Follow Up:  1. Follow up with neurosurgery outpatient on 2/11 for consultation and management of Chiari Malformation 2. Follow up with Opthalmology for discussion of possible need for surgical procedure for potential vision loss.  Significant Procedures:  Lumbar Puncture  Significant Labs and Imaging:  Recent Labs  Lab 08/29/17 2327  WBC 5.9  HGB 11.0*  HCT 33.2*  PLT 274   Recent Labs  Lab 08/29/17 2327  CREATININE 0.62   2/3 - beta hCG: <5.0 2/3 - HIV antibody: non-reactive  Imaging/Diagnostic Tests:  2/3 - CT Venogram: FINDINGS: No evidence for acute infarction, hemorrhage, mass lesion, hydrocephalus, or extra-axial fluid. Normal cerebral volume. No white matter disease.  Partial empty sella. Approximate 1 cm tonsillar herniation.  Post infusion imaging of the head demonstrates no areas of venous sinus thrombosis.  IMPRESSION: No evidence for venous sinus thrombosis.  Signs of idiopathic intracranial hypertension similar to prior MR.  2/4 - Lumbar Puncture: clear colorless CSF with an opening pressure of 24 cm water. Closing pressure was 0 cm of water. 25.5 ml of CSF were obtained for laboratory studies.    Results/Tests Pending at Time of Discharge:  None  Discharge Medications:  Allergies as  of 08/31/2017      Reactions   Hydrocodone Hives, Itching   Latex Hives   Latuda [lurasidone Hcl] Hives, Itching   Caused throat to itch badly   Phenergan [promethazine] Other (See Comments)   Caused aggression and rage   Reglan [metoclopramide] Other (See Comments)   Aggressive and angry   Tape Hives      Medication List    STOP taking these medications   acetaZOLAMIDE 250 MG tablet Commonly known as:  DIAMOX     TAKE these medications   acetaminophen 500 MG tablet Commonly known as:  TYLENOL Take 1,000 mg by mouth every 6 (six) hours as needed (for headaches).   CARAFATE 1 GM/10ML suspension Generic drug:  sucralfate Take 10 mLs (1 g total) by mouth 4 (four) times daily - with meals and at bedtime.   oxyCODONE-acetaminophen 7.5-325 MG tablet Commonly known as:  PERCOCET Take 1 tablet by mouth every 8 (eight) hours as needed for moderate pain or severe pain (Take only as needed for pain).   pantoprazole 40 MG tablet Commonly known as:  PROTONIX Take 1 tablet (40 mg total) by mouth 2 (two) times daily.   valACYclovir 1000 MG tablet Commonly known as:  VALTREX Take 1 tablet (1,000 mg total) by mouth daily. What changed:    when to take this  reasons to take this       Discharge Instructions: Please refer to Patient Instructions section of EMR for full details.  Patient was counseled important signs and symptoms that should prompt return to medical care, changes in medications, dietary instructions, activity restrictions, and follow up appointments.   Follow-Up Appointments:   Arlyce Harman, DO 09/01/2017, 10:25 PM PGY-1, [facility]

## 2017-08-31 ENCOUNTER — Telehealth: Payer: Self-pay | Admitting: Family Medicine

## 2017-08-31 DIAGNOSIS — R110 Nausea: Secondary | ICD-10-CM

## 2017-08-31 DIAGNOSIS — R42 Dizziness and giddiness: Secondary | ICD-10-CM

## 2017-08-31 DIAGNOSIS — R51 Headache: Secondary | ICD-10-CM

## 2017-08-31 DIAGNOSIS — R519 Headache, unspecified: Secondary | ICD-10-CM

## 2017-08-31 DIAGNOSIS — H539 Unspecified visual disturbance: Secondary | ICD-10-CM

## 2017-08-31 DIAGNOSIS — G935 Compression of brain: Secondary | ICD-10-CM

## 2017-08-31 LAB — BASIC METABOLIC PANEL
ANION GAP: 8 (ref 5–15)
BUN: 6 mg/dL (ref 6–20)
CALCIUM: 8.8 mg/dL — AB (ref 8.9–10.3)
CO2: 18 mmol/L — AB (ref 22–32)
Chloride: 113 mmol/L — ABNORMAL HIGH (ref 101–111)
Creatinine, Ser: 0.7 mg/dL (ref 0.44–1.00)
GLUCOSE: 106 mg/dL — AB (ref 65–99)
POTASSIUM: 3.6 mmol/L (ref 3.5–5.1)
Sodium: 139 mmol/L (ref 135–145)

## 2017-08-31 LAB — CBC
HEMATOCRIT: 37.2 % (ref 36.0–46.0)
Hemoglobin: 11.7 g/dL — ABNORMAL LOW (ref 12.0–15.0)
MCH: 30.2 pg (ref 26.0–34.0)
MCHC: 31.5 g/dL (ref 30.0–36.0)
MCV: 95.9 fL (ref 78.0–100.0)
Platelets: 272 10*3/uL (ref 150–400)
RBC: 3.88 MIL/uL (ref 3.87–5.11)
RDW: 13.2 % (ref 11.5–15.5)
WBC: 4.7 10*3/uL (ref 4.0–10.5)

## 2017-08-31 MED ORDER — TRAMADOL HCL 50 MG PO TABS
50.0000 mg | ORAL_TABLET | Freq: Two times a day (BID) | ORAL | Status: DC | PRN
  Administered 2017-08-31: 50 mg via ORAL
  Filled 2017-08-31: qty 1

## 2017-08-31 MED ORDER — KETOROLAC TROMETHAMINE 30 MG/ML IJ SOLN
60.0000 mg | Freq: Four times a day (QID) | INTRAMUSCULAR | Status: DC | PRN
  Administered 2017-08-31: 60 mg via INTRAVENOUS
  Filled 2017-08-31: qty 2

## 2017-08-31 MED ORDER — OXYCODONE-ACETAMINOPHEN 7.5-325 MG PO TABS: 1.0000 | ORAL_TABLET | Freq: Three times a day (TID) | ORAL | 0 refills | Status: DC | PRN

## 2017-08-31 NOTE — Progress Notes (Signed)
Patient seen for PT evaluation. During mobility patient endorsed increased Left sided numbness and paresthesias UE > LE (reports this is new as of today and worsened with activity). Patient also reported increased headache and visual changes while mobilizing and upright, improved with return to reclined position in bed. Nursing notified. Full assessment note to follow.  Charlotte Crumbevon Mckay Tegtmeyer, PT DPT  Board Certified Neurologic Specialist [phone]

## 2017-08-31 NOTE — Progress Notes (Signed)
Subjective: Still having 8/10 HA, sitting at 45 degree angle and stating laying down or sitting straight up makes her HA worse. ANy coughing or straining makes th HA worse.   Exam: Vitals:   08/31/17 0200 08/31/17 0540  BP: 123/62 (!) 120/57  Pulse: 74 69  Resp: 18 18  Temp: 98 F (36.7 C) 98.1 F (36.7 C)  SpO2: 100% 100%    Physical Exam   HEENT-  Normocephalic, no lesions, without obvious abnormality.  Normal external eye and conjunctiva.   Cardiovascular- S1-S2 audible, pulses palpable throughout   Lungs-no rhonchi or wheezing noted, no excessive working breathing.  Saturations within normal limits Abdomen- All 4 quadrants palpated and nontender Extremities- Warm, dry and intact Musculoskeletal-no joint tenderness, deformity or swelling Skin-warm and dry, no hyperpigmentation, vitiligo, or suspicious lesions    Neuro:  Mental Status: Alert, oriented, thought content appropriate.  Speech fluent without evidence of aphasia.  Able to follow 3 step commands without difficulty. Cranial Nerves: II:  Visual fields grossly normal,  III,IV, VI: ptosis not present, extra-ocular motions intact bilaterally pupils equal, round, reactive to light and accommodation V,VII: smile symmetric, facial light touch sensation normal bilaterally VIII: hearing normal bilaterally IX,X: uvula rises symmetrically XI: bilateral shoulder shrug XII: midline tongue extension Motor: Right : Upper extremity   5/5    Left:     Upper extremity   5/5  Lower extremity   5/5     Lower extremity   5/5 Tone and bulk:normal tone throughout; no atrophy noted Sensory: Pinprick and light touch intact throughout, bilaterally Deep Tendon Reflexes: 2+ and symmetric throughout Plantars: Right: downgoing   Left: downgoing Cerebellar: normal finger-to-nose, normal rapid alternating movements and normal heel-to-shin test     Medications:  Scheduled: . enoxaparin (LOVENOX) injection  40 mg Subcutaneous Daily  .  LORazepam  1 mg Intravenous Once  . pantoprazole  40 mg Oral BID  . ramelteon  8 mg Oral QHS  . sucralfate  2 g Oral BID WC  . valACYclovir  1,000 mg Oral Daily   Continuous:  ZOX:[id number]:acetaminophen **OR** acetaminophen, ibuprofen, ketorolac, ondansetron (ZOFRAN) IV, traMADol  Pertinent Labs/Diagnostics:   Ct Venogram Head  Result Date: 08/29/2017  IMPRESSION: No evidence for venous sinus thrombosis. Signs of idiopathic intracranial hypertension similar to prior MR. Electronically Signed   By: Elsie StainJohn T Curnes M.D.   On: 08/29/2017 17:32   Dg Fluoro Guide Lumbar Puncture  Result Date: 08/30/2017  IMPRESSION: Therapeutic lumbar puncture performed without complication. Electronically Signed   By: Francene BoyersJames  Maxwell M.D.   On: 08/30/2017 10:32     Felicie MornDavid Smith PA-C [facility] [phone]  When Felicie Mornavid Smith went by earlier, he recommended trying to lie flat and the patient reports that this has helped with her headache.   Impression:  32 year old female with positional headaches, worsened with Diamox and lumbar puncture. With this history, I think that her chiari rather than IIH is likely the symptomatic culprit here. I think that neurosurgical follow up is definitely indicated. She needs a formal eye exam with fundoscopic pictures to see if there is any papilledema. If there is, it would make pseudotumor more likely. I am not sure what to make of the previous pressure of 30 a year ago, but certainly being prone with her habitus could account for the pressure of 24 on this admission.   I would favor holding diamox, getting opthalmology exam, and getting a neurosurgical evaluation.  She may need analgesia with nsaids/narcotics in the interim,  but would not plan for long term narcotics.  She has some unsteadiness, could consider PT evaluation.   No further inpatient recommendations at this time.     Ritta Slot, MD [facility] [phone]  If 7pm- 7am, please  page neurology on call as listed in AMION.  08/31/2017, 8:54 AM

## 2017-08-31 NOTE — Evaluation (Signed)
Physical Therapy Evaluation Patient Details Name: Angela Mosley MRN: [id number] DOB: 08/12/1985 Today's Date: 08/31/2017   History of Present Illness  32 y.o. female presenting with 3 days of worsening headache. PMH is significant for idiopathic intracranial hypertension, Chiari malformation, PTSD, obesity, marijuana substance use disorder, bipolar type II disorder, GERD  Clinical Impression  Patient seen for mobility assessment. Patient is mobilizing well despite neurological symptoms. OF NOTE: During mobility patient endorsed increased Left sided numbness and paresthesias UE > LE (reports this is new as of today and worsened with activity). Patient also reported increased headache and visual changes while mobilizing and upright, improved with return to reclined position in bed. At this time, do not feel patient requires further acute PT needs as patient is mobilizing well and anticipate this will improve as symptoms resolve. Will sign off.  Patient may benefit from visual assessment from OT therapist for compensation strategies re: double vision    Follow Up Recommendations No PT follow up    Equipment Recommendations  None recommended by PT    Recommendations for Other Services       Precautions / Restrictions        Mobility  Bed Mobility Overal bed mobility: Independent                Transfers Overall transfer level: Independent Equipment used: None                Ambulation/Gait Ambulation/Gait assistance: Independent Ambulation Distance (Feet): 340 Feet Assistive device: None Gait Pattern/deviations: WFL(Within Functional Limits)     General Gait Details: modest instability with ambulatino, one noted LOb able to correct and patient aware of onset (symptom related) to be able to accomadate without assist  Stairs Stairs: Yes Stairs assistance: Supervision Stair Management: One rail Right Number of Stairs: 12 General stair comments: no difficulty  with performance, reports increased headache/visual issues/ and paresthesias upon exertion of stair negotiation  Wheelchair Mobility    Modified Rankin (Stroke Patients Only)       Balance Overall balance assessment: Needs assistance                           High level balance activites: Side stepping;Braiding;Backward walking;Direction changes;Turns;Sudden stops;Head turns High Level Balance Comments: Patient able to perform all aspects of higher level balance without physical assist but did report some increase in symptoms with activity             Pertinent Vitals/Pain      Home Living Family/patient expects to be discharged to:: Private residence Living Arrangements: Children;Spouse/significant other;Parent Available Help at Discharge: Family Type of Home: House Home Access: Stairs to enter Entrance Stairs-Rails: None Entrance Stairs-Number of Steps: 1 Home Layout: Two level Home Equipment: None      Prior Function Level of Independence: Independent               Hand Dominance        Extremity/Trunk Assessment   Upper Extremity Assessment Upper Extremity Assessment: LUE deficits/detail LUE Sensation: decreased light touch    Lower Extremity Assessment Lower Extremity Assessment: Overall WFL for tasks assessed;LLE deficits/detail LLE Sensation: decreased light touch       Communication      Cognition Arousal/Alertness: Awake/alert Behavior During Therapy: WFL for tasks assessed/performed Overall Cognitive Status: Within Functional Limits for tasks assessed  General Comments      Exercises     Assessment/Plan    PT Assessment Patent does not need any further PT services  PT Problem List         PT Treatment Interventions      PT Goals (Current goals can be found in the Care Plan section)  Acute Rehab PT Goals PT Goal Formulation: All assessment and education  complete, DC therapy    Frequency     Barriers to discharge        Co-evaluation               AM-PAC PT "6 Clicks" Daily Activity  Outcome Measure Difficulty turning over in bed (including adjusting bedclothes, sheets and blankets)?: None Difficulty moving from lying on back to sitting on the side of the bed? : None Difficulty sitting down on and standing up from a chair with arms (e.g., wheelchair, bedside commode, etc,.)?: None Help needed moving to and from a bed to chair (including a wheelchair)?: None Help needed walking in hospital room?: None Help needed climbing 3-5 steps with a railing? : A Little 6 Click Score: 23    End of Session   Activity Tolerance: Patient tolerated treatment well;Other (comment)(increased symptoms with activity) Patient left: in bed;with call bell/phone within reach;with family/visitor present Nurse Communication: Mobility status(symptoms provoked) PT Visit Diagnosis: Other symptoms and signs involving the nervous system (R29.898)    Time: 1206-1226 PT Time Calculation (min) (ACUTE ONLY): 20 min   Charges:   PT Evaluation $PT Eval Low Complexity: 1 Low     PT G Codes:        Charlotte Crumb, PT DPT  Board Certified Neurologic Specialist [phone]   Fabio Asa 08/31/2017, 1:53 PM

## 2017-08-31 NOTE — Progress Notes (Signed)
In to see patient at this time, states HA is 10/10 pain, and is also very nauseated. Zoran given for nausea, Toradol already given this morning shortly after 5am and the patient states that gave her little relief. Advil given at this time as well, and Dr. Janee Mornhompson was informed of the patients condition and pain, along with the poor effectiveness of the medication she has already received. Patient states she also has numbness and tingling in both hands at this time. Repaged physician to make him aware of this new finding.

## 2017-08-31 NOTE — Discharge Instructions (Signed)
You were admitted to the hospital for a severe intractable headache. Over the course of your hospital stay it was determined the most likely source of your headache is due to your Chiari Malformation. You need to rest at home as much as possible and only take narcotics when you absolutely need them for severe uncontrollable pain.  You have several follow up appointments and these are absolutely essential to attend for your overall health and well-being. You have an appointment with an Opthamologist tomorrow on 2/6. Dr. Laruth BouchardGroat's office address is listed in the follow up providers section of your discharge paperwork. You also have  If you lose eyesight, your headache becomes unmanageable please return to the hospital immediately.

## 2017-08-31 NOTE — Care Management Note (Signed)
Case Management Note  Patient Details  Name: Angela Mosley MRN: [id number] Date of Birth: Aug 16, 1985  Subjective/Objective:                    Action/Plan: Pt discharging home with self care. Pt has PCP: Dr Nelson ChimesAmin, Insurance: Medicaid and transportation home. No further needs per CM.   Expected Discharge Date:  08/31/17               Expected Discharge Plan:  Home/Self Care  In-House Referral:     Discharge planning Services     Post Acute Care Choice:    Choice offered to:     DME Arranged:    DME Agency:     HH Arranged:    HH Agency:     Status of Service:  Completed, signed off  If discussed at [employer] Length of Stay Meetings, dates discussed:    Additional Comments:  Kermit BaloKelli F Bonnee Zertuche, RN 08/31/2017, 3:44 PM

## 2017-08-31 NOTE — Progress Notes (Signed)
Family Medicine Teaching Service Daily Progress Note Intern Pager: [phone]  Patient name: Angela Mosley Medical record number: [id number] Date of birth: 1986-07-19 Age: 32 y.o. Gender: female  Primary Care Provider: [name], MD Consultants: Neurology, IR Code Status: Full  Pt Overview and Major Events to Date:  Angela Mosley is a 32 y.o. female presenting with 3 days of worsening headache. PMH is significant for idiopathic intracranial hypertension, Chiari malformation, PTSD, obesity, marijuana substance use disorder, bipolar type II disorder, GERD  Assessment and Plan:  Headache of unclear etiology: Acute. Stable. 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 Her CT venogram was negative for thrombosis further supporting diagnosis of IIH causing her current symptoms. Here LP on 2/4 did not help her symptoms and has worsened on Diamox. LP was done is prone position which could have falsely elevated her pressure. Given her constellation of symptoms I think atypical migraine should be considered. - Discontinue Diamox 250mg , Neurology recs appreciated - Neurosurgery was consulted and will see outpatient on 2/11 - Needs Opthamology eval; possibly today as inpatient but definetly appointment outpatient prior to neurosurgery outpt appt. - Tylenol PRN for headache - Toradol 30mg  q6 prn scheduled to end on 2/6 - Tramadol 50mg  one time dose to help control symptoms. Consider when pain is out of control.  Bipolar Disorder: Chronic. No current symptoms this admission. Patient self medicates with marijuana. Says she has taken Latuda in the past but had a bad reaction. - monitor mood - consider Seroquel for maintenance if patient is agreeable or if mood deteriorates  GERD: Chronic. Patient symptoms are well controlled on home regimen. - Continue Pantoprazole 40mg  BID - Continue Carafate 2g BID  Poor Sleep: Acute.  Patient states she had poor sleep and is tired but cannot rest due to headache. She states she usually does not have this problem but started in the hospital.  - Continue Ramelteon 8mg  for sleep  FEN/GI: Reg diet PPx: Lovenox  Disposition: stable  Subjective:  Patient states today her headache is 10/10 and worse when I saw her this morning. She states this occurred rapidly, is feeling nausea, no vomiting, and having photophobia. Resting in the dark is helping some. She is also having bilateral hand numbness. No loss of bladder function. Headache had been somewhat responsive to Toradol but this recent headache is not.  Objective: Temp:  [98 F (36.7 C)-98.9 F (37.2 C)] 98.1 F (36.7 C) (02/05 0540) Pulse Rate:  [60-74] 69 (02/05 0540) Resp:  [18] 18 (02/05 0540) BP: (102-123)/(53-81) 120/57 (02/05 0540) SpO2:  [98 %-100 %] 100 % (02/05 0540) Physical Exam: Gen: Alert and Oriented x 3, NAD HEENT: Normocephalic, atraumatic, PERRLA, EOMI, diffusely TTP CV: RRR, no murmur, normal S1, S2 split, +2 pulses dorsalis pedis bilaterally Resp: CTAB, no wheezing, rales, or rhonchi, comfortable work of breathing Abd: non-distended, non-tender, soft, +bs in all four quadrants MSK: neck and trapezius tightness and tenderness to palpation; FROM in all four extremities Ext: no clubbing, cyanosis, or edema Neuro: CN II-XII intact, no focal or gross deficits Skin: warm, dry, intact, no rashes  Laboratory: Recent Labs  Lab 08/29/17 2327 08/31/17 0527  WBC 5.9 4.7  HGB 11.0* 11.7*  HCT 33.2* 37.2  PLT 274 272   Recent Labs  Lab 08/29/17 2327 08/31/17 0527  NA  --  139  K  --  3.6  CL  --  113*  CO2  --  18*  BUN  --  6  CREATININE 0.62 0.70  CALCIUM  --  8.8*  GLUCOSE  --  106*   2/3 - beta hCG: <5.0 2/3 - HIV antibody: pending  Imaging/Diagnostic Tests:  2/3 - CT Venogram: FINDINGS: No evidence for acute infarction, hemorrhage, mass lesion, hydrocephalus, or extra-axial  fluid. Normal cerebral volume. No white matter disease.  Partial empty sella.  Approximate 1 cm tonsillar herniation.  Post infusion imaging of the head demonstrates no areas of venous sinus thrombosis.  IMPRESSION: No evidence for venous sinus thrombosis.  Signs of idiopathic intracranial hypertension similar to prior MR.  2/4 - Lumbar Puncture: clear colorless CSF with an opening pressure of 24 cm water. Closing pressure was 0 cm of water. 25.5 ml of CSF were obtained for laboratory studies.   Arlyce HarmanLockamy, Ronita Hargreaves, DO 08/31/2017, 6:47 AM PGY-1, [place] [facility] FPTS Intern pager: [phone], text pages welcome

## 2017-09-01 ENCOUNTER — Other Ambulatory Visit: Payer: Self-pay | Admitting: Family Medicine

## 2017-09-01 ENCOUNTER — Other Ambulatory Visit: Payer: Self-pay | Admitting: Neurosurgery

## 2017-09-01 DIAGNOSIS — K2900 Acute gastritis without bleeding: Secondary | ICD-10-CM

## 2017-09-01 DIAGNOSIS — K219 Gastro-esophageal reflux disease without esophagitis: Secondary | ICD-10-CM

## 2017-09-01 NOTE — Progress Notes (Signed)
Opened in error

## 2017-09-01 NOTE — Telephone Encounter (Signed)
After Hours Emergency Line Call  Received call from Angela Mosley's mother who stated patient had been discharged from hospital with Rx for hydrocodone sent in to pharmacy.  She states this pharmacy was closed by the time she was discharged.  Per chart review, it appears a printed Rx for 14 tabs of percocet had been provided to the patient to be filled at any pharmacy.  She took this to [facility] and states she is unable to fill because it had already been paid for.  Unclear as to why this would be the case and wonder if she is seeking pain medications.  Patient came on the line and also expressed anger regarding situation. Patient's mother states she had called to see if Rx sent by neurosurgeon or primary team and was told it was the primary team.    Red flags reviewed and discussed with her that if she feels her pain is that uncontrolled, she may return to the ED.  Does not seem she returned per reviewing chart several hours later.   Freddrick MarchYashika Geralene Afshar, MD [facility], PGY-2

## 2017-09-03 ENCOUNTER — Ambulatory Visit (HOSPITAL_COMMUNITY)
Admission: EM | Admit: 2017-09-03 | Discharge: 2017-09-03 | Disposition: A | Discharge status: Home / Self Care | Payer: Medicaid Other

## 2017-09-03 NOTE — ED Notes (Signed)
Called x1, no answer... Adv by front staff pt left.

## 2017-09-06 ENCOUNTER — Inpatient Hospital Stay: Payer: Medicaid Other | Admitting: Student

## 2017-09-06 NOTE — Pre-Procedure Instructions (Signed)
Angela Mosley  09/06/2017      [facility] [place] - [address] [place] [address] Phone: [phone] Fax: [phone]    Your procedure is scheduled on Thursday February 14.  [facility] Admitting at 5:30 A.M.  Call this number if you have problems the morning of surgery:  [phone]   Remember:  Do not eat food or drink liquids after midnight.  Take these medicines the morning of surgery with A SIP OF WATER:   pantoprazole (protonix) oxycodone-acetaminophen (percocet) OR Acetaminophen (tylenol) if needed  7 days prior to surgery STOP taking any Aspirin(unless otherwise instructed by your surgeon), Aleve, Naproxen, Ibuprofen, Motrin, Advil, Goody's, BC's, all herbal medications, fish oil, and all vitamins    Do not wear jewelry, make-up or nail polish.  Do not wear lotions, powders, or perfumes, or deodorant.  Do not shave 48 hours prior to surgery.  Men may shave face and neck.  Do not bring valuables to the hospital.  [facility] is not responsible for any belongings or valuables.  Contacts, dentures or bridgework may not be worn into surgery.  Leave your suitcase in the car.  After surgery it may be brought to your room.  For patients admitted to the hospital, discharge time will be determined by your treatment team.  Patients discharged the day of surgery will not be allowed to drive home.    Special instructions:    [place]- Preparing For Surgery  Before surgery, you can play an important role. Because skin is not sterile, your skin needs to be as free of germs as possible. You can reduce the number of germs on your skin by washing with CHG (chlorahexidine gluconate) Soap before surgery.  CHG is an antiseptic cleaner which kills germs and bonds with the skin to continue killing germs even after washing.  Please do not use if you have an allergy to CHG or antibacterial  soaps. If your skin becomes reddened/irritated stop using the CHG.  Do not shave (including legs and underarms) for at least 48 hours prior to first CHG shower. It is OK to shave your face.  Please follow these instructions carefully.   1. Shower the NIGHT BEFORE SURGERY and the MORNING OF SURGERY with CHG.   2. If you chose to wash your hair, wash your hair first as usual with your normal shampoo.  3. After you shampoo, rinse your hair and body thoroughly to remove the shampoo.  4. Use CHG as you would any other liquid soap. You can apply CHG directly to the skin and wash gently with a scrungie or a clean washcloth.   5. Apply the CHG Soap to your body ONLY FROM THE NECK DOWN.  Do not use on open wounds or open sores. Avoid contact with your eyes, ears, mouth and genitals (private parts). Wash Face and genitals (private parts)  with your normal soap.  6. Wash thoroughly, paying special attention to the area where your surgery will be performed.  7. Thoroughly rinse your body with warm water from the neck down.  8. DO NOT shower/wash with your normal soap after using and rinsing off the CHG Soap.  9. Pat yourself dry with a CLEAN TOWEL.  10. Wear CLEAN PAJAMAS to bed the night before surgery, wear comfortable clothes the morning of surgery  11. Place CLEAN SHEETS on your bed the night of your first shower and DO  NOT SLEEP WITH PETS.    Day of Surgery: Do not apply any deodorants/lotions. Please wear clean clothes to the hospital/surgery center.      Please read over the following fact sheets that you were given. Coughing and Deep Breathing and Surgical Site Infection Prevention

## 2017-09-07 ENCOUNTER — Other Ambulatory Visit: Payer: Self-pay

## 2017-09-07 ENCOUNTER — Encounter (HOSPITAL_COMMUNITY): Payer: Self-pay

## 2017-09-07 ENCOUNTER — Encounter (HOSPITAL_COMMUNITY)
Admission: RE | Admit: 2017-09-07 | Discharge: 2017-09-07 | Disposition: A | Discharge status: Home / Self Care | Payer: Medicaid Other | Source: Ambulatory Visit | Attending: Neurosurgery | Admitting: Neurosurgery

## 2017-09-07 ENCOUNTER — Encounter: Payer: Self-pay | Admitting: Family Medicine

## 2017-09-07 DIAGNOSIS — G935 Compression of brain: Secondary | ICD-10-CM | POA: Diagnosis not present

## 2017-09-07 DIAGNOSIS — Z01818 Encounter for other preprocedural examination: Secondary | ICD-10-CM | POA: Diagnosis not present

## 2017-09-07 HISTORY — DX: Gastro-esophageal reflux disease without esophagitis: K21.9

## 2017-09-07 NOTE — Progress Notes (Signed)
Angela FredricksonJeronae Mosley . MRN [id number] . PCP Amin . Last [facility] visit 09/16/16 - Did not keep 10/14/16 appointment (maybe left a VM saying she could not.   Only other [facility] visit was 08/28/16 . Last PCP visit 07/21/17 Allergic reaction to latuda as prescribed by Dr. Serafina Royalsan Warden on 07/12/17.  Amin's note indicates that she had previously been on Zyprexa that did not work well . Psych Meds as currently listed in EMR - NONE . Important note - patient currently scheduled for neurosurgery to correct a Chiari malformation, which causes intracranial hypertension and headache. Marland Kitchen. PCP note: Did not discuss with Amin since patient not on any meds.  Since not on any psych meds, no action at this point.

## 2017-09-08 NOTE — H&P (Signed)
Angela Mosley comes in today with a slightly complex history. She has been having pretty severe headaches now for about a month or so. She was actually [facility] and just discharged last night. She was admitted secondary to these headaches. She has had some problems with her vision. She had actually gone to see Dr. Lucia [facility]askinsAhern, [facility] on 08/17/2017, for evaluation of tinnitus in her right ear. She says that those symptoms had started 2 years ago. She had a pulsing noise that if she put pressure on the right upper neck, it would stop. The noise was continuous, it progressed to become louder and more annoying. She has had nausea. She has had vomiting, and as she states severe pressure. She says her vision has gotten blurry. 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 It was thought by Dr. Lucia GaskinsAhern and other physicians that she had idiopathic intracranial hypertension. VITALS: 32 years of age, height 5 feet 2 inches, weight 200 pounds, temperature is 98.7, blood pressure is 112/77, pulse is 72, pain is 10/10.  She does have gastroesophageal reflux. She has undergone oral surgery. She has had a tubal ligation. ALLERGIES: Hydrocodone,Latex, Latuda, Reglan, Prometozine, promote hives, rash, itching, anger, and rage.  History includes diabetes, hypertension, myocardial infarction, hyperlipidemia, pulmonary embolism, cataracts, glaucoma, prostate cancer, pancreatic cancer, stomach cancer, and cerebral aneurysm.  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 PAST MEDICAL HISTORY: Includes anxiety, depression, headaches, herpes, hyperemesis gravidarum, and posttraumatic stress disorder. MEDICATIONS: Include Carafate, Valacyclovir, and Diamox. REVIEW OF SYSTEMS: Positive for tinnitus, shortness of breath, nausea, vomiting, neck pain. EXAM: She is alert, oriented by 4. She answers all questions appropriately. Memory,  language, attention span, and fund of knowledge are normal. Pupils equal, round, and reactive to light. She has full extraocular movements. Full visual fields. Hearing is intact to voice. Symmetric facial sensation and movement. Uvula elevates midline. Shoulder shrug is normal. Tongue protrudes in the midline. She has 5/5 strength in both upper and lower extremities. She has no drift on exam. Negative Romberg test. Normal gait. Reflexes are 2+ at the biceps, triceps, brachioradialis, knees, and ankles. She has intact proprioception.  MRI of the brain is reviewed. It does not show any enlargement of the ventricular system. She does have an empty sella, but she also has a very large Chiari malformation 11 mm. There is no mass. 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 According to a report by Dr. Dione BoozeGroat given to me personally, she does not have papilledema. I guess I am at this point for the diagnosis of a Chiari malformation. She may have other entities, but the biggest and worse thing that I can see is a Chiari. She does not have an extrinsic cause for the Chiari to occur. She does not have a mass pushing down on the brainstem and/or cerebellar tonsils. I think this is a Chiari malformation. She has headaches and she has other symptoms, which may be typically or not associated with the Chiari, but for right now I think the most pressing issue is the Chiari malformation. What I will plan on doing is decompressing this next week Thursday in a routine Chiari decompression, which involves a duraplasty and removal of the occiput and possibly C1. I would prefer then to see what symptoms are persisting and what other actions can be taken for  her. But again, she does not have papilledema, and there is nothing that Dr. Dione Booze was able to see in the optic nerves that would bode ill for her eyesight in the immediate future.

## 2017-09-08 NOTE — Anesthesia Preprocedure Evaluation (Addendum)
Anesthesia Evaluation  Patient identified by MRN, date of birth, ID band Patient awake    Reviewed: Allergy & Precautions, NPO status , Patient's Chart, lab work & pertinent test results  Airway Mallampati: II  TM Distance: >3 FB Neck ROM: Full    Dental  (+) Missing,    Pulmonary former smoker,    Pulmonary exam normal breath sounds clear to auscultation       Cardiovascular negative cardio ROS Normal cardiovascular exam Rhythm:Regular Rate:Normal     Neuro/Psych  Headaches, PSYCHIATRIC DISORDERS Anxiety Depression Bipolar Disorder PTSD (post-traumatic stress disorder)Decreased hearing in right ear    GI/Hepatic Neg liver ROS, GERD  Medicated and Controlled,  Endo/Other  negative endocrine ROS  Renal/GU negative Renal ROS     Musculoskeletal negative musculoskeletal ROS (+)   Abdominal (+) + obese,   Peds  Hematology  (+) anemia ,   Anesthesia Other Findings CHIARI MALFORMATION TYPE I  Reproductive/Obstetrics hcg negative                            Anesthesia Physical Anesthesia Plan  ASA: III  Anesthesia Plan: General   Post-op Pain Management:    Induction: Intravenous  PONV Risk Score and Plan: 3 and Ondansetron, Dexamethasone and Treatment may vary due to age or medical condition  Airway Management Planned: Oral ETT  Additional Equipment: Arterial line, CVP and Ultrasound Guidance Line Placement  Intra-op Plan:   Post-operative Plan: Possible Post-op intubation/ventilation  Informed Consent: I have reviewed the patients History and Physical, chart, labs and discussed the procedure including the risks, benefits and alternatives for the proposed anesthesia with the patient or authorized representative who has indicated his/her understanding and acceptance.   Dental advisory given  Plan Discussed with: CRNA  Anesthesia Plan Comments:        Anesthesia Quick  Evaluation

## 2017-09-09 ENCOUNTER — Inpatient Hospital Stay (HOSPITAL_COMMUNITY): Payer: Medicaid Other | Admitting: Anesthesiology

## 2017-09-09 ENCOUNTER — Inpatient Hospital Stay (HOSPITAL_COMMUNITY)
Admission: RE | Disposition: A | Discharge status: Home / Self Care | Payer: Self-pay | Source: Ambulatory Visit | Attending: Neurosurgery

## 2017-09-09 ENCOUNTER — Encounter (HOSPITAL_COMMUNITY): Payer: Self-pay | Admitting: Certified Registered Nurse Anesthetist

## 2017-09-09 ENCOUNTER — Inpatient Hospital Stay (HOSPITAL_COMMUNITY)
Admission: RE | Admit: 2017-09-09 | Discharge: 2017-09-15 | DRG: 027 | Disposition: A | Discharge status: Home / Self Care | Payer: Medicaid Other | Source: Ambulatory Visit | Attending: Neurosurgery | Admitting: Neurosurgery

## 2017-09-09 DIAGNOSIS — F4310 Post-traumatic stress disorder, unspecified: Secondary | ICD-10-CM | POA: Diagnosis present

## 2017-09-09 DIAGNOSIS — Z885 Allergy status to narcotic agent status: Secondary | ICD-10-CM | POA: Diagnosis not present

## 2017-09-09 DIAGNOSIS — Z888 Allergy status to other drugs, medicaments and biological substances status: Secondary | ICD-10-CM

## 2017-09-09 DIAGNOSIS — E669 Obesity, unspecified: Secondary | ICD-10-CM | POA: Diagnosis present

## 2017-09-09 DIAGNOSIS — Z91040 Latex allergy status: Secondary | ICD-10-CM

## 2017-09-09 DIAGNOSIS — Z87891 Personal history of nicotine dependence: Secondary | ICD-10-CM | POA: Diagnosis not present

## 2017-09-09 DIAGNOSIS — G935 Compression of brain: Secondary | ICD-10-CM | POA: Diagnosis present

## 2017-09-09 DIAGNOSIS — Z6836 Body mass index (BMI) 36.0-36.9, adult: Secondary | ICD-10-CM

## 2017-09-09 DIAGNOSIS — Z91048 Other nonmedicinal substance allergy status: Secondary | ICD-10-CM | POA: Diagnosis not present

## 2017-09-09 DIAGNOSIS — R51 Headache: Secondary | ICD-10-CM | POA: Diagnosis present

## 2017-09-09 HISTORY — PX: SUBOCCIPITAL CRANIECTOMY CERVICAL LAMINECTOMY: SHX5404

## 2017-09-09 LAB — TYPE AND SCREEN
ABO/RH(D): O POS
ANTIBODY SCREEN: NEGATIVE

## 2017-09-09 LAB — ABO/RH: ABO/RH(D): O POS

## 2017-09-09 LAB — MRSA PCR SCREENING: MRSA BY PCR: NEGATIVE

## 2017-09-09 LAB — POCT PREGNANCY, URINE: PREG TEST UR: NEGATIVE

## 2017-09-09 SURGERY — SUBOCCIPITAL CRANIECTOMY CERVICAL LAMINECTOMY/DURAPLASTY
Anesthesia: General

## 2017-09-09 MED ORDER — SODIUM CHLORIDE 0.9% FLUSH: 3.0000 mL | INTRAVENOUS | Status: DC | PRN

## 2017-09-09 MED ORDER — SUCCINYLCHOLINE CHLORIDE 200 MG/10ML IV SOSY
PREFILLED_SYRINGE | INTRAVENOUS | Status: AC
  Filled 2017-09-09: qty 10

## 2017-09-09 MED ORDER — MICROFIBRILLAR COLL HEMOSTAT EX PADS
MEDICATED_PAD | CUTANEOUS | Status: DC | PRN
  Administered 2017-09-09: 1 via TOPICAL

## 2017-09-09 MED ORDER — MENTHOL 3 MG MT LOZG: 1.0000 | LOZENGE | OROMUCOSAL | Status: DC | PRN

## 2017-09-09 MED ORDER — VALACYCLOVIR HCL 500 MG PO TABS
1000.0000 mg | ORAL_TABLET | Freq: Every day | ORAL | Status: DC | PRN
  Administered 2017-09-13 – 2017-09-15 (×2): 1000 mg via ORAL
  Filled 2017-09-09 (×3): qty 2

## 2017-09-09 MED ORDER — DEXAMETHASONE 4 MG PO TABS
4.0000 mg | ORAL_TABLET | Freq: Four times a day (QID) | ORAL | Status: DC
  Administered 2017-09-10 – 2017-09-15 (×21): 4 mg via ORAL
  Filled 2017-09-09 (×21): qty 1

## 2017-09-09 MED ORDER — SODIUM CHLORIDE 0.9% FLUSH
3.0000 mL | Freq: Two times a day (BID) | INTRAVENOUS | Status: DC
  Administered 2017-09-10 – 2017-09-14 (×10): 3 mL via INTRAVENOUS

## 2017-09-09 MED ORDER — OXYCODONE HCL 5 MG PO TABS
10.0000 mg | ORAL_TABLET | ORAL | Status: DC | PRN
  Administered 2017-09-09 – 2017-09-15 (×18): 10 mg via ORAL
  Filled 2017-09-09 (×19): qty 2

## 2017-09-09 MED ORDER — LIDOCAINE-EPINEPHRINE 0.5 %-1:200000 IJ SOLN
INTRAMUSCULAR | Status: AC
  Filled 2017-09-09: qty 1

## 2017-09-09 MED ORDER — EPHEDRINE 5 MG/ML INJ
INTRAVENOUS | Status: AC
  Filled 2017-09-09: qty 10

## 2017-09-09 MED ORDER — LIDOCAINE HCL (CARDIAC) 20 MG/ML IV SOLN
INTRAVENOUS | Status: DC | PRN
  Administered 2017-09-09: 80 mg via INTRAVENOUS

## 2017-09-09 MED ORDER — OXYCODONE HCL 5 MG PO TABS
5.0000 mg | ORAL_TABLET | ORAL | Status: DC | PRN
  Administered 2017-09-15: 5 mg via ORAL
  Filled 2017-09-09: qty 1

## 2017-09-09 MED ORDER — MORPHINE SULFATE (PF) 4 MG/ML IV SOLN
1.0000 mg | INTRAVENOUS | Status: DC | PRN
  Administered 2017-09-09 – 2017-09-13 (×14): 1 mg via INTRAVENOUS
  Filled 2017-09-09 (×15): qty 1

## 2017-09-09 MED ORDER — ONDANSETRON HCL 4 MG/2ML IJ SOLN
INTRAMUSCULAR | Status: DC | PRN
  Administered 2017-09-09: 4 mg via INTRAVENOUS

## 2017-09-09 MED ORDER — LIDOCAINE-EPINEPHRINE 0.5 %-1:200000 IJ SOLN
INTRAMUSCULAR | Status: DC | PRN
  Administered 2017-09-09: 8 mL

## 2017-09-09 MED ORDER — PROPOFOL 10 MG/ML IV BOLUS
INTRAVENOUS | Status: AC
  Filled 2017-09-09: qty 20

## 2017-09-09 MED ORDER — BISACODYL 5 MG PO TBEC
5.0000 mg | DELAYED_RELEASE_TABLET | Freq: Every day | ORAL | Status: DC | PRN
  Administered 2017-09-11 – 2017-09-13 (×2): 5 mg via ORAL
  Filled 2017-09-09 (×2): qty 1

## 2017-09-09 MED ORDER — DEXAMETHASONE SODIUM PHOSPHATE 4 MG/ML IJ SOLN
4.0000 mg | Freq: Four times a day (QID) | INTRAMUSCULAR | Status: DC
  Administered 2017-09-09 – 2017-09-13 (×3): 4 mg via INTRAVENOUS
  Filled 2017-09-09 (×3): qty 1

## 2017-09-09 MED ORDER — SENNOSIDES-DOCUSATE SODIUM 8.6-50 MG PO TABS
1.0000 | ORAL_TABLET | Freq: Every evening | ORAL | Status: DC | PRN
  Administered 2017-09-11: 1 via ORAL
  Filled 2017-09-09 (×2): qty 1

## 2017-09-09 MED ORDER — ONDANSETRON HCL 4 MG PO TABS
4.0000 mg | ORAL_TABLET | Freq: Four times a day (QID) | ORAL | Status: DC | PRN
  Administered 2017-09-13: 4 mg via ORAL
  Filled 2017-09-09: qty 1

## 2017-09-09 MED ORDER — HYDROMORPHONE HCL 1 MG/ML IJ SOLN
INTRAMUSCULAR | Status: AC
  Administered 2017-09-09: 0.5 mg via INTRAVENOUS
  Filled 2017-09-09: qty 1

## 2017-09-09 MED ORDER — PROPOFOL 10 MG/ML IV BOLUS
INTRAVENOUS | Status: DC | PRN
  Administered 2017-09-09: 50 mg via INTRAVENOUS
  Administered 2017-09-09: 150 mg via INTRAVENOUS

## 2017-09-09 MED ORDER — CHLORHEXIDINE GLUCONATE CLOTH 2 % EX PADS: 6.0000 | MEDICATED_PAD | Freq: Once | CUTANEOUS | Status: DC

## 2017-09-09 MED ORDER — MIDAZOLAM HCL 2 MG/2ML IJ SOLN
INTRAMUSCULAR | Status: AC
  Filled 2017-09-09: qty 2

## 2017-09-09 MED ORDER — ACETAMINOPHEN 650 MG RE SUPP: 650.0000 mg | RECTAL | Status: DC | PRN

## 2017-09-09 MED ORDER — ACETAMINOPHEN 325 MG PO TABS
650.0000 mg | ORAL_TABLET | ORAL | Status: DC | PRN
  Administered 2017-09-14: 650 mg via ORAL
  Filled 2017-09-09: qty 2

## 2017-09-09 MED ORDER — MIDAZOLAM HCL 5 MG/5ML IJ SOLN
INTRAMUSCULAR | Status: DC | PRN
  Administered 2017-09-09 (×2): 1 mg via INTRAVENOUS

## 2017-09-09 MED ORDER — SUCRALFATE 1 GM/10ML PO SUSP
1.0000 g | Freq: Three times a day (TID) | ORAL | Status: DC
  Administered 2017-09-10 – 2017-09-15 (×23): 1 g via ORAL
  Filled 2017-09-09 (×26): qty 10

## 2017-09-09 MED ORDER — FENTANYL CITRATE (PF) 250 MCG/5ML IJ SOLN
INTRAMUSCULAR | Status: AC
  Filled 2017-09-09: qty 5

## 2017-09-09 MED ORDER — ROCURONIUM BROMIDE 100 MG/10ML IV SOLN
INTRAVENOUS | Status: DC | PRN
  Administered 2017-09-09: 10 mg via INTRAVENOUS
  Administered 2017-09-09: 20 mg via INTRAVENOUS
  Administered 2017-09-09: 10 mg via INTRAVENOUS
  Administered 2017-09-09: 50 mg via INTRAVENOUS
  Administered 2017-09-09: 20 mg via INTRAVENOUS

## 2017-09-09 MED ORDER — DEXAMETHASONE SODIUM PHOSPHATE 10 MG/ML IJ SOLN
INTRAMUSCULAR | Status: DC | PRN
  Administered 2017-09-09: 10 mg via INTRAVENOUS

## 2017-09-09 MED ORDER — ONDANSETRON HCL 4 MG PO TABS
4.0000 mg | ORAL_TABLET | Freq: Three times a day (TID) | ORAL | Status: DC | PRN
  Filled 2017-09-09: qty 1

## 2017-09-09 MED ORDER — POVIDONE-IODINE 5 % EX AERS
INHALATION_SPRAY | CUTANEOUS | Status: AC
  Filled 2017-09-09: qty 88.7

## 2017-09-09 MED ORDER — HYDROMORPHONE HCL 1 MG/ML IJ SOLN
INTRAMUSCULAR | Status: AC
  Filled 2017-09-09: qty 1

## 2017-09-09 MED ORDER — GABAPENTIN 300 MG PO CAPS
300.0000 mg | ORAL_CAPSULE | Freq: Three times a day (TID) | ORAL | Status: DC
  Administered 2017-09-09 – 2017-09-15 (×19): 300 mg via ORAL
  Filled 2017-09-09 (×19): qty 1

## 2017-09-09 MED ORDER — GELATIN ABSORBABLE MT POWD
OROMUCOSAL | Status: DC | PRN
  Administered 2017-09-09: 08:00:00 via TOPICAL

## 2017-09-09 MED ORDER — CEFAZOLIN SODIUM-DEXTROSE 2-4 GM/100ML-% IV SOLN
2.0000 g | INTRAVENOUS | Status: AC
  Administered 2017-09-09: 2 g via INTRAVENOUS
  Filled 2017-09-09: qty 100

## 2017-09-09 MED ORDER — POTASSIUM CHLORIDE IN NACL 20-0.9 MEQ/L-% IV SOLN
INTRAVENOUS | Status: DC
  Administered 2017-09-09: 17:00:00 via INTRAVENOUS
  Filled 2017-09-09 (×3): qty 1000

## 2017-09-09 MED ORDER — FENTANYL CITRATE (PF) 100 MCG/2ML IJ SOLN
INTRAMUSCULAR | Status: DC | PRN
  Administered 2017-09-09: 50 ug via INTRAVENOUS
  Administered 2017-09-09: 150 ug via INTRAVENOUS
  Administered 2017-09-09 (×3): 50 ug via INTRAVENOUS

## 2017-09-09 MED ORDER — CELECOXIB 200 MG PO CAPS
200.0000 mg | ORAL_CAPSULE | Freq: Two times a day (BID) | ORAL | Status: DC
  Administered 2017-09-10 – 2017-09-15 (×12): 200 mg via ORAL
  Filled 2017-09-09 (×13): qty 1

## 2017-09-09 MED ORDER — ONDANSETRON HCL 4 MG/2ML IJ SOLN
INTRAMUSCULAR | Status: AC
  Filled 2017-09-09: qty 2

## 2017-09-09 MED ORDER — PHENOL 1.4 % MT LIQD
1.0000 | OROMUCOSAL | Status: DC | PRN
  Administered 2017-09-11 (×2): 1 via OROMUCOSAL
  Filled 2017-09-09: qty 177

## 2017-09-09 MED ORDER — POVIDONE-IODINE 10 % EX OINT
TOPICAL_OINTMENT | CUTANEOUS | Status: DC | PRN
  Administered 2017-09-09: 1 via TOPICAL

## 2017-09-09 MED ORDER — FLEET ENEMA 7-19 GM/118ML RE ENEM
1.0000 | ENEMA | Freq: Once | RECTAL | Status: AC | PRN
  Administered 2017-09-13: 1 via RECTAL
  Filled 2017-09-09: qty 1

## 2017-09-09 MED ORDER — OXYCODONE HCL ER 10 MG PO T12A
10.0000 mg | EXTENDED_RELEASE_TABLET | Freq: Two times a day (BID) | ORAL | Status: DC
  Administered 2017-09-10 – 2017-09-15 (×12): 10 mg via ORAL
  Filled 2017-09-09 (×12): qty 1

## 2017-09-09 MED ORDER — LACTATED RINGERS IV SOLN
INTRAVENOUS | Status: DC | PRN
  Administered 2017-09-09: 07:00:00 via INTRAVENOUS

## 2017-09-09 MED ORDER — PANTOPRAZOLE SODIUM 40 MG PO TBEC
40.0000 mg | DELAYED_RELEASE_TABLET | Freq: Two times a day (BID) | ORAL | Status: DC
  Administered 2017-09-10 – 2017-09-15 (×12): 40 mg via ORAL
  Filled 2017-09-09 (×12): qty 1

## 2017-09-09 MED ORDER — ROCURONIUM BROMIDE 10 MG/ML (PF) SYRINGE
PREFILLED_SYRINGE | INTRAVENOUS | Status: AC
  Filled 2017-09-09: qty 5

## 2017-09-09 MED ORDER — ONDANSETRON HCL 4 MG/2ML IJ SOLN
4.0000 mg | Freq: Four times a day (QID) | INTRAMUSCULAR | Status: DC | PRN
  Administered 2017-09-09 – 2017-09-12 (×6): 4 mg via INTRAVENOUS
  Filled 2017-09-09 (×6): qty 2

## 2017-09-09 MED ORDER — DIAZEPAM 5 MG PO TABS
ORAL_TABLET | ORAL | Status: AC
  Filled 2017-09-09: qty 1

## 2017-09-09 MED ORDER — SODIUM CHLORIDE 0.9 % IV SOLN: 250.0000 mL | INTRAVENOUS | Status: DC

## 2017-09-09 MED ORDER — HYDROMORPHONE HCL 1 MG/ML IJ SOLN
0.2500 mg | INTRAMUSCULAR | Status: DC | PRN
  Administered 2017-09-09 (×4): 0.5 mg via INTRAVENOUS

## 2017-09-09 MED ORDER — DIAZEPAM 5 MG PO TABS
5.0000 mg | ORAL_TABLET | Freq: Four times a day (QID) | ORAL | Status: DC | PRN
  Administered 2017-09-09 – 2017-09-15 (×8): 5 mg via ORAL
  Filled 2017-09-09 (×7): qty 1

## 2017-09-09 MED ORDER — DOCUSATE SODIUM 100 MG PO CAPS
100.0000 mg | ORAL_CAPSULE | Freq: Two times a day (BID) | ORAL | Status: DC
  Administered 2017-09-10 – 2017-09-15 (×12): 100 mg via ORAL
  Filled 2017-09-09 (×12): qty 1

## 2017-09-09 MED ORDER — THROMBIN 20000 UNITS EX SOLR
CUTANEOUS | Status: AC
  Filled 2017-09-09: qty 20000

## 2017-09-09 MED ORDER — 0.9 % SODIUM CHLORIDE (POUR BTL) OPTIME
TOPICAL | Status: DC | PRN
  Administered 2017-09-09 (×3): 1000 mL

## 2017-09-09 MED ORDER — DEXAMETHASONE SODIUM PHOSPHATE 10 MG/ML IJ SOLN
INTRAMUSCULAR | Status: AC
  Filled 2017-09-09: qty 1

## 2017-09-09 MED ORDER — SUGAMMADEX SODIUM 200 MG/2ML IV SOLN
INTRAVENOUS | Status: DC | PRN
  Administered 2017-09-09: 182.4 mg via INTRAVENOUS

## 2017-09-09 MED ORDER — OXYCODONE HCL 5 MG PO TABS
ORAL_TABLET | ORAL | Status: AC
  Filled 2017-09-09: qty 2

## 2017-09-09 MED ORDER — PROMETHAZINE HCL 25 MG/ML IJ SOLN: 6.2500 mg | INTRAMUSCULAR | Status: DC | PRN

## 2017-09-09 MED ORDER — ZOLPIDEM TARTRATE 5 MG PO TABS: 5.0000 mg | ORAL_TABLET | Freq: Every evening | ORAL | Status: DC | PRN

## 2017-09-09 MED ORDER — POVIDONE-IODINE 10 % EX OINT
TOPICAL_OINTMENT | CUTANEOUS | Status: AC
  Filled 2017-09-09: qty 28.35

## 2017-09-09 SURGICAL SUPPLY — 81 items
BENZOIN TINCTURE PRP APPL 2/3 (GAUZE/BANDAGES/DRESSINGS) IMPLANT
BLADE CLIPPER SURG (BLADE) ×3 IMPLANT
BLADE ULTRA TIP 2M (BLADE) IMPLANT
BUR ACORN 6.0 PRECISION (BURR) ×2 IMPLANT
BUR ACORN 6.0MM PRECISION (BURR) ×1
CABLE BIPOLOR RESECTION CORD (MISCELLANEOUS) ×3 IMPLANT
CANISTER SUCT 3000ML PPV (MISCELLANEOUS) ×3 IMPLANT
CARTRIDGE OIL MAESTRO DRILL (MISCELLANEOUS) ×1 IMPLANT
CLIP VESOCCLUDE MED 6/CT (CLIP) ×3 IMPLANT
COVER BACK TABLE 60X90IN (DRAPES) ×3 IMPLANT
DECANTER SPIKE VIAL GLASS SM (MISCELLANEOUS) ×3 IMPLANT
DIFFUSER DRILL AIR PNEUMATIC (MISCELLANEOUS) ×3 IMPLANT
DRAPE LAPAROTOMY 100X72 PEDS (DRAPES) ×3 IMPLANT
DRAPE MICROSCOPE LEICA (MISCELLANEOUS) IMPLANT
DRAPE WARM FLUID 44X44 (DRAPE) ×3 IMPLANT
DRSG OPSITE POSTOP 4X6 (GAUZE/BANDAGES/DRESSINGS) ×3 IMPLANT
DURAGUARD 04CMX04CM ×3 IMPLANT
DURAPREP 6ML APPLICATOR 50/CS (WOUND CARE) ×3 IMPLANT
ELECT CAUTERY BLADE 6.4 (BLADE) ×3 IMPLANT
ELECT REM PT RETURN 9FT ADLT (ELECTROSURGICAL) ×3
ELECTRODE REM PT RTRN 9FT ADLT (ELECTROSURGICAL) ×1 IMPLANT
EVACUATOR 1/8 PVC DRAIN (DRAIN) IMPLANT
EVACUATOR SILICONE 100CC (DRAIN) IMPLANT
GAUZE SPONGE 4X4 12PLY STRL (GAUZE/BANDAGES/DRESSINGS) IMPLANT
GAUZE SPONGE 4X4 16PLY XRAY LF (GAUZE/BANDAGES/DRESSINGS) IMPLANT
GLOVE BIO SURGEON STRL SZ 6.5 (GLOVE) IMPLANT
GLOVE BIO SURGEON STRL SZ7 (GLOVE) IMPLANT
GLOVE BIO SURGEON STRL SZ7.5 (GLOVE) IMPLANT
GLOVE BIO SURGEON STRL SZ8 (GLOVE) IMPLANT
GLOVE BIO SURGEON STRL SZ8.5 (GLOVE) IMPLANT
GLOVE BIO SURGEONS STRL SZ 6.5 (GLOVE)
GLOVE BIOGEL M 8.0 STRL (GLOVE) IMPLANT
GLOVE ECLIPSE 6.5 STRL STRAW (GLOVE) IMPLANT
GLOVE ECLIPSE 7.0 STRL STRAW (GLOVE) IMPLANT
GLOVE ECLIPSE 7.5 STRL STRAW (GLOVE) IMPLANT
GLOVE ECLIPSE 8.0 STRL XLNG CF (GLOVE) IMPLANT
GLOVE ECLIPSE 8.5 STRL (GLOVE) IMPLANT
GLOVE EXAM NITRILE LRG STRL (GLOVE) IMPLANT
GLOVE EXAM NITRILE XL STR (GLOVE) IMPLANT
GLOVE EXAM NITRILE XS STR PU (GLOVE) IMPLANT
GLOVE INDICATOR 6.5 STRL GRN (GLOVE) IMPLANT
GLOVE INDICATOR 7.0 STRL GRN (GLOVE) IMPLANT
GLOVE INDICATOR 7.5 STRL GRN (GLOVE) IMPLANT
GLOVE INDICATOR 8.0 STRL GRN (GLOVE) IMPLANT
GLOVE INDICATOR 8.5 STRL (GLOVE) IMPLANT
GLOVE OPTIFIT SS 8.0 STRL (GLOVE) IMPLANT
GLOVE SURG SS PI 6.5 STRL IVOR (GLOVE) ×3 IMPLANT
GLOVE SURG SS PI 7.5 STRL IVOR (GLOVE) ×3 IMPLANT
GOWN STRL REUS W/ TWL LRG LVL3 (GOWN DISPOSABLE) ×1 IMPLANT
GOWN STRL REUS W/ TWL XL LVL3 (GOWN DISPOSABLE) ×1 IMPLANT
GOWN STRL REUS W/TWL 2XL LVL3 (GOWN DISPOSABLE) ×3 IMPLANT
GOWN STRL REUS W/TWL LRG LVL3 (GOWN DISPOSABLE) ×2
GOWN STRL REUS W/TWL XL LVL3 (GOWN DISPOSABLE) ×2
HEMOSTAT SURGICEL 2X14 (HEMOSTASIS) ×3 IMPLANT
KIT BASIN OR (CUSTOM PROCEDURE TRAY) ×3 IMPLANT
KIT ROOM TURNOVER OR (KITS) ×3 IMPLANT
NEEDLE HYPO 25X1 1.5 SAFETY (NEEDLE) ×3 IMPLANT
NS IRRIG 1000ML POUR BTL (IV SOLUTION) ×9 IMPLANT
OIL CARTRIDGE MAESTRO DRILL (MISCELLANEOUS) ×3
PACK CRANIOTOMY CUSTOM (CUSTOM PROCEDURE TRAY) ×3 IMPLANT
PAD ARMBOARD 7.5X6 YLW CONV (MISCELLANEOUS) ×9 IMPLANT
PATTIES SURGICAL .5 X1 (DISPOSABLE) IMPLANT
PATTIES SURGICAL 1/4 X 3 (GAUZE/BANDAGES/DRESSINGS) IMPLANT
RUBBERBAND STERILE (MISCELLANEOUS) IMPLANT
SEALANT ADHERUS EXTEND TIP (MISCELLANEOUS) ×3 IMPLANT
SPONGE LAP 4X18 X RAY DECT (DISPOSABLE) IMPLANT
SPONGE SURGIFOAM ABS GEL 100 (HEMOSTASIS) ×3 IMPLANT
STAPLER SKIN PROX WIDE 3.9 (STAPLE) IMPLANT
SUT ETHILON 3 0 FSL (SUTURE) ×3 IMPLANT
SUT NURALON 4 0 TR CR/8 (SUTURE) ×3 IMPLANT
SUT VIC AB 0 CT1 18XCR BRD8 (SUTURE) ×2 IMPLANT
SUT VIC AB 0 CT1 8-18 (SUTURE) ×4
SUT VIC AB 2-0 CT2 18 VCP726D (SUTURE) ×3 IMPLANT
SUT VIC AB 3-0 SH 8-18 (SUTURE) ×3 IMPLANT
SYR CONTROL 10ML LL (SYRINGE) ×3 IMPLANT
TOWEL GREEN STERILE (TOWEL DISPOSABLE) ×3 IMPLANT
TOWEL GREEN STERILE FF (TOWEL DISPOSABLE) IMPLANT
TRAY FOLEY BAG SILVER LF 16FR (SET/KITS/TRAYS/PACK) ×3 IMPLANT
TRAY FOLEY W/METER SILVER 16FR (SET/KITS/TRAYS/PACK) IMPLANT
UNDERPAD 30X30 (UNDERPADS AND DIAPERS) IMPLANT
WATER STERILE IRR 1000ML POUR (IV SOLUTION) ×3 IMPLANT

## 2017-09-09 NOTE — Anesthesia Procedure Notes (Signed)
Procedure Name: Intubation Date/Time: 09/09/2017 7:59 AM Performed by: Margarita RanaHoltzman, Beverlie Kurihara Leffew, CRNA Pre-anesthesia Checklist: Patient identified, Emergency Drugs available, Suction available and Patient being monitored Patient Re-evaluated:Patient Re-evaluated prior to induction Oxygen Delivery Method: Circle System Utilized Preoxygenation: Pre-oxygenation with 100% oxygen Induction Type: IV induction Ventilation: Mask ventilation without difficulty Laryngoscope Size: Miller and 2 Grade View: Grade I Tube type: Oral Tube size: 7.0 mm Number of attempts: 1 Airway Equipment and Method: Stylet and Oral airway Placement Confirmation: ETT inserted through vocal cords under direct vision,  positive ETCO2 and breath sounds checked- equal and bilateral Secured at: 23 cm Tube secured with: Tape Dental Injury: Teeth and Oropharynx as per pre-operative assessment  Difficulty Due To: Difficulty was unanticipated

## 2017-09-09 NOTE — Anesthesia Procedure Notes (Signed)
Arterial Line Insertion Start/End2/14/2019 7:20 AM, 09/09/2017 7:26 AM Performed by: Margarita RanaHoltzman, Sevannah Madia Leffew, CRNA, CRNA  Patient location: Pre-op. Preanesthetic checklist: patient identified, IV checked, site marked, risks and benefits discussed, surgical consent, monitors and equipment checked, pre-op evaluation, timeout performed and anesthesia consent Patient sedated Left, radial was placed Catheter size: 20 G Hand hygiene performed  and maximum sterile barriers used  Allen's test indicative of satisfactory collateral circulation Attempts: 1 Procedure performed without using ultrasound guided technique. Following insertion, dressing applied and Biopatch. Post procedure assessment: normal  Patient tolerated the procedure well with no immediate complications. Additional procedure comments: Placed by Glynn OctaveH Wells SRNA.

## 2017-09-09 NOTE — Transfer of Care (Signed)
Immediate Anesthesia Transfer of Care Note  Patient: Angela Mosley  Procedure(s) Performed: CHIARI DECOMPRESSION (N/A )  Patient Location: PACU  Anesthesia Type:General  Level of Consciousness: awake, alert , oriented, patient cooperative and responds to stimulation  Airway & Oxygen Therapy: Patient Spontanous Breathing and Patient connected to face mask oxygen  Post-op Assessment: Report given to RN, Post -op Vital signs reviewed and stable and Patient moving all extremities X 4  Post vital signs: Reviewed and stable  Last Vitals:  Vitals:   09/09/17 0600  BP: 126/72  Pulse: 88  Resp: 18  Temp: 36.8 C  SpO2: 100%    Last Pain:  Vitals:   09/09/17 0639  TempSrc:   PainSc: 4          Complications: No apparent anesthesia complications

## 2017-09-09 NOTE — Anesthesia Postprocedure Evaluation (Signed)
Anesthesia Post Note  Patient: Angela Mosley  Procedure(s) Performed: CHIARI DECOMPRESSION (N/A )     Patient location during evaluation: PACU Anesthesia Type: General Level of consciousness: awake Pain management: pain level controlled Vital Signs Assessment: post-procedure vital signs reviewed and stable Respiratory status: spontaneous breathing, nonlabored ventilation, respiratory function stable and patient connected to nasal cannula oxygen Cardiovascular status: blood pressure returned to baseline and stable Postop Assessment: no apparent nausea or vomiting Anesthetic complications: no    Last Vitals:  Vitals:   09/09/17 1342 09/09/17 1400  BP:  127/68  Pulse: 75 69  Resp: 13 15  Temp:    SpO2: 99% 97%    Last Pain:  Vitals:   09/09/17 1400  TempSrc:   PainSc: Asleep                 Ryan P Ellender

## 2017-09-10 ENCOUNTER — Encounter (HOSPITAL_COMMUNITY): Payer: Self-pay | Admitting: Neurosurgery

## 2017-09-10 ENCOUNTER — Other Ambulatory Visit: Payer: Self-pay

## 2017-09-10 MED ORDER — DIPHENHYDRAMINE HCL 25 MG PO CAPS
25.0000 mg | ORAL_CAPSULE | Freq: Four times a day (QID) | ORAL | Status: DC | PRN
  Administered 2017-09-11 – 2017-09-13 (×5): 25 mg via ORAL
  Filled 2017-09-10 (×5): qty 1

## 2017-09-10 MED FILL — Thrombin For Soln 20000 Unit: CUTANEOUS | Qty: 1 | Status: AC

## 2017-09-10 NOTE — Evaluation (Signed)
Physical Therapy Evaluation Patient Details Name: Angela Mosley MRN: [id number] DOB: 03-Apr-1986 Today's Date: 09/10/2017   History of Present Illness  32 y.o. 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  Pt with significant PMH of PTSD.    Clinical Impression  Initial assessment complete and pt mobilizing well, guarded and conservative with her movements.  She did not require much assist and I anticipate she will progress well enough to d/c home with family supervision and no therapy f/u.  She had quite a few questions about bathroom equipment, so I put in for an OT consult to address bathroom.   PT to follow acutely for deficits listed below.       Follow Up Recommendations No PT follow up;Supervision for mobility/OOB    Equipment Recommendations  3in1 (PT);Other (comment)(shower chair)    Recommendations for Other Services OT consult     Precautions / Restrictions Precautions Precautions: Fall Precaution Comments: mildly unsteady on her feet with limited endurance      Mobility  Bed Mobility Overal bed mobility: Modified Independent             General bed mobility comments: able to get into bed unassisted.   Transfers Overall transfer level: Needs assistance Equipment used: None Transfers: Sit to/from Stand Sit to Stand: Supervision         General transfer comment: supervision for safety  Ambulation/Gait Ambulation/Gait assistance: Min guard Ambulation Distance (Feet): 5 Feet Assistive device: None       General Gait Details: Min guard assist to help pt walk from chair back to bed.  She did not feel up for hallway gait today, but is agreeable to walk tomorrow.        Balance Overall balance assessment: Needs assistance Sitting-balance support: Feet supported;No upper extremity supported Sitting balance-Leahy Scale: Good      Standing balance support: No upper extremity supported Standing balance-Leahy Scale: Good Standing balance comment: able to preform peri care (wash up) in standing with only supervision for safety                             Pertinent Vitals/Pain Pain Assessment: Faces Faces Pain Scale: Hurts even more Pain Location: incisional Pain Descriptors / Indicators: Aching;Burning Pain Intervention(s): Limited activity within patient's tolerance;Monitored during session;Repositioned    Home Living Family/patient expects to be discharged to:: Private residence Living Arrangements: Spouse/significant other;Parent;Children Available Help at Discharge: Family Type of Home: House Home Access: Stairs to enter Entrance Stairs-Rails: None Entrance Stairs-Number of Steps: 1 Home Layout: Two level Home Equipment: None      Prior Function Level of Independence: Independent         Comments: drives, 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      Hand Dominance   Dominant Hand: Right    Extremity/Trunk Assessment   Upper Extremity Assessment Upper Extremity Assessment: Defer to OT evaluation    Lower Extremity Assessment Lower Extremity Assessment: Overall WFL for tasks assessed(grossly normal per seated MMT) LLE Sensation: WNL    Cervical / Trunk Assessment Cervical / Trunk Assessment: Other exceptions Cervical / Trunk Exceptions: s/p upper cervical surgery  Communication   Communication: No difficulties  Cognition Arousal/Alertness: Awake/alert Behavior During Therapy: WFL for tasks assessed/performed Overall Cognitive  Status: Within Functional Limits for tasks assessed                                        General Comments General comments (skin integrity, edema, etc.): We had a lot of discussion re: bathroom and bathroom equipment, so I went ahead and  consulted OT        Assessment/Plan    PT Assessment Patient needs continued PT services  PT Problem List Decreased activity tolerance;Decreased balance;Decreased mobility;Decreased knowledge of use of DME;Decreased knowledge of precautions;Pain       PT Treatment Interventions DME instruction;Gait training;Stair training;Functional mobility training;Therapeutic activities;Therapeutic exercise;Balance training;Patient/family education    PT Goals (Current goals can be found in the Care Plan section)  Acute Rehab PT Goals Patient Stated Goal: to get back to normal, stop HA and other symptoms PT Goal Formulation: With patient/family Time For Goal Achievement: 09/24/17 Potential to Achieve Goals: Good    Frequency Min 5X/week           AM-PAC PT "6 Clicks" Daily Activity  Outcome Measure Difficulty turning over in bed (including adjusting bedclothes, sheets and blankets)?: None Difficulty moving from lying on back to sitting on the side of the bed? : None Difficulty sitting down on and standing up from a chair with arms (e.g., wheelchair, bedside commode, etc,.)?: None Help needed moving to and from a bed to chair (including a wheelchair)?: A Little Help needed walking in hospital room?: A Little Help needed climbing 3-5 steps with a railing? : A Little 6 Click Score: 21    End of Session   Activity Tolerance: Patient limited by fatigue;Patient limited by pain Patient left: in bed;with call bell/phone within reach;with family/visitor present   PT Visit Diagnosis: Other symptoms and signs involving the nervous system (R29.898);Pain Pain - Right/Left: (incisional) Pain - part of body: (neck)    Time: [id number] PT Time Calculation (min) (ACUTE ONLY): 31 min   Charges:        Lurena Joinerebecca B. Caya Soberanis, PT, DPT [id number]#[phone]   PT Evaluation $PT Eval Low Complexity: 1 Low PT Treatments $Therapeutic Activity: 8-22 mins   09/10/2017, 4:47 PM

## 2017-09-10 NOTE — Progress Notes (Signed)
Patient ID: Angela FredricksonJeronae Mosley, female   DOB: 1985/11/04, 32 y.o.   MRN: [id number] BP (!) 148/112   Pulse (!) 103   Temp 98.5 F (36.9 C) (Oral)   Resp 19   Ht 5\' 2"  (1.575 m)   Wt 91.2 kg (201 lb)   LMP 08/30/2017   SpO2 100%   BMI 36.76 kg/m  Alert and oriented x 4, speech is clear fluent Wound is clean, dry, without signs of infection Moving all extremities well Reports hearing a clicking sound in her ears Needs more time

## 2017-09-10 NOTE — Op Note (Signed)
09/09/2017  12:18 PM  PATIENT:  Angela Mosley  32 y.o. female  PRE-OPERATIVE DIAGNOSIS:  CHIARI MALFORMATION TYPE I  POST-OPERATIVE DIAGNOSIS:  CHIARI MALFORMATION TYPE I  PROCEDURE:  Procedure(s): suboccipital craniectomy for posterior fossa decompression, dural patch graft, C1 laminectomy   SURGEON: Surgeon(s): Coletta Memosabbell, Chelesea Weiand, MD Maeola HarmanStern, Joseph, MD  ASSISTANTS:Stern, Jomarie LongsJoseph  ANESTHESIA:   general  EBL:  Total I/O In: 80 [I.V.:80] Out: -   BLOOD ADMINISTERED:none  CELL SAVER GIVEN:none  COUNT:per nursing  DRAINS: none   SPECIMEN:  No Specimen  DICTATION: Angela FredricksonJeronae Kelly was taken to the operating room, intubated, and placed under a general anesthetic without difficulty. I placed a three pin Mayfield head holder once adequate anesthesia was obtained. She was positioned prone on flat rolls so that all pressure points were properly padded. I connected her head to the bed with the Mayfield adapter in a military tuck position.I shaved her head and it  was prepped and draped in a sterile manner. I infiltrated lidocaine into the planned incision on the posterior neck. I opened the skin with a 10 blade and dissected sharply to the nuchal ligament. I exposed the occiput, foramen magnum, posterior arch of C1, and the spinous process of C2.  I placed self retaining retractors and proceeded with the decompression. I used a drill to thin the occiput, then a Kerrison punch to remove bone and expose the posterior fossa dura. I created enough space to expose the lower cerebellum, and also opened the foramen magnum. I performed a C1 laminectomy using the drill and punches.  I opened the dura in a y configuration, and went far enough caudad to be below the cerebellar tonsils. I then with Dr. Fredrich BirksStern's assistance sewed in a dural patch graft. We then proceeded with closure. I irrigated, then used vicryl sutures to approximate the nuchal ligament, subcutaneous and subcuticular layers. I used  nylon to approximate the skin edges. I applied a sterile dressing. I removed the Mayfield from the adapter, turned her onto the bed, and removed the Mayfield head holder.   PLAN OF CARE: Admit to inpatient   PATIENT DISPOSITION:  PACU - hemodynamically stable.   Delay start of Pharmacological VTE agent (>24hrs) due to surgical blood loss or risk of bleeding:  yes

## 2017-09-11 NOTE — Progress Notes (Signed)
Patient asked RN to place stethoscope behind right ear to hear a clicking sound the patient hears continuously. RN able to hear clicking/dripping sound. MD aware of patient's complaint regarding this. Information passed on to Dr. Danielle DessElsner. - Alwyn RenJamie Jeanclaude Wentworth, RN

## 2017-09-11 NOTE — Progress Notes (Signed)
Patient ID: Angela FredricksonJeronae Pridmore, female   DOB: 1985-12-26, 32 y.o.   MRN: [id number] Vital signs are stable Patient still complaining of substantial headache and neck pain She is mobilizing albeit slowly Encouraged her continued with mobility is best tolerated She is just 2 days out from surgical intervention

## 2017-09-11 NOTE — Progress Notes (Signed)
Physical Therapy Treatment Patient Details Name: Angela Mosley MRN: [id number] DOB: November 03, 1985 Today's Date: 09/11/2017    History of Present Illness 32 y.o. 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  Pt with significant PMH of PTSD.      PT Comments    Pt very pleasant, moving well and able to increase activity tolerance and gait. Pt with nausea and pain moving to bathroom earlier today with pt medicated and able to transition to EOB and gait with increased tolerance. Pt educated for slow transitions to ease movement and to continue to mobilize and ambulate with nursing staff. will continue to follow.     Follow Up Recommendations  No PT follow up;Supervision for mobility/OOB     Equipment Recommendations  3in1 (PT)    Recommendations for Other Services       Precautions / Restrictions Precautions Precautions: Fall Restrictions Weight Bearing Restrictions: No    Mobility  Bed Mobility Overal bed mobility: Modified Independent             General bed mobility comments: HOB elevated 35degrees with ability to pivot to side without assist  Transfers Overall transfer level: Needs assistance   Transfers: Sit to/from Stand Sit to Stand: Supervision         General transfer comment: supervision for safety  Ambulation/Gait Ambulation/Gait assistance: Min guard Ambulation Distance (Feet): 160 Feet Assistive device: None Gait Pattern/deviations: Step-through pattern;Decreased stride length   Gait velocity interpretation: Below normal speed for age/gender General Gait Details: pt with very slow cautious steps with cues for increased distance and performs better with distraction and conversation throughout to mobilize. Pt reports head and neck pain but able to move, No LOB   Stairs            Wheelchair Mobility    Modified Rankin  (Stroke Patients Only)       Balance Overall balance assessment: No apparent balance deficits (not formally assessed)   Sitting balance-Leahy Scale: Good       Standing balance-Leahy Scale: Good                              Cognition Arousal/Alertness: Awake/alert Behavior During Therapy: WFL for tasks assessed/performed Overall Cognitive Status: Within Functional Limits for tasks assessed                                        Exercises      General Comments        Pertinent Vitals/Pain Pain Assessment: 0-10 Pain Score: 5  Pain Location: posterior neck and head Pain Descriptors / Indicators: Aching Pain Intervention(s): Limited activity within patient's tolerance;Repositioned;Monitored during session;Premedicated before session    Home Living                      Prior Function            PT Goals (current goals can now be found in the care plan section) Progress towards PT goals: Progressing toward goals    Frequency           PT Plan Current plan remains appropriate    Co-evaluation              AM-PAC PT "6 Clicks" Daily Activity  Outcome  Measure  Difficulty turning over in bed (including adjusting bedclothes, sheets and blankets)?: A Little Difficulty moving from lying on back to sitting on the side of the bed? : A Little Difficulty sitting down on and standing up from a chair with arms (e.g., wheelchair, bedside commode, etc,.)?: A Little Help needed moving to and from a bed to chair (including a wheelchair)?: A Little Help needed walking in hospital room?: A Little Help needed climbing 3-5 steps with a railing? : A Little 6 Click Score: 18    End of Session Equipment Utilized During Treatment: Gait belt Activity Tolerance: Patient tolerated treatment well Patient left: in chair;with call bell/phone within reach;with family/visitor present Nurse Communication: Mobility status PT Visit Diagnosis:  Other symptoms and signs involving the nervous system (R29.898);Other abnormalities of gait and mobility (R26.89)     Time: 1115-1140 PT Time Calculation (min) (ACUTE ONLY): 25 min  Charges:  $Gait Training: 23-37 mins                    G Codes:       Angela Mosley, PT [phone]    Angela Mosley B Angela Mosley 09/11/2017, 11:45 AM

## 2017-09-12 LAB — URINALYSIS, ROUTINE W REFLEX MICROSCOPIC
Bilirubin Urine: NEGATIVE
GLUCOSE, UA: NEGATIVE mg/dL
Hgb urine dipstick: NEGATIVE
Ketones, ur: NEGATIVE mg/dL
LEUKOCYTES UA: NEGATIVE
Nitrite: NEGATIVE
PROTEIN: NEGATIVE mg/dL
Specific Gravity, Urine: 1.013 (ref 1.005–1.030)
pH: 7 (ref 5.0–8.0)

## 2017-09-12 MED ORDER — SULFAMETHOXAZOLE-TRIMETHOPRIM 800-160 MG PO TABS
1.0000 | ORAL_TABLET | Freq: Two times a day (BID) | ORAL | Status: AC
  Administered 2017-09-12 – 2017-09-14 (×6): 1 via ORAL
  Filled 2017-09-12 (×6): qty 1

## 2017-09-12 NOTE — Progress Notes (Signed)
Patient ID: Angela Mosley, female   DOB: 1986/03/06, 32 y.o.   MRN: [id number] Vital signs are stable Patient is mobilizing well Motor function appears to be improving Patient still aware of ticking noise in the back of the head which changes with position Nurses have noticed this 2 by auscultation We'll continue to support patient this time by transferring to the floor and encouraging for the ambulation

## 2017-09-13 ENCOUNTER — Inpatient Hospital Stay (HOSPITAL_COMMUNITY): Payer: Medicaid Other

## 2017-09-13 NOTE — Progress Notes (Signed)
Physical Therapy Treatment Patient Details Name: Angela FredricksonJeronae Veilleux MRN: [id number] DOB: 05/26/1986 Today's Date: 09/13/2017    History of Present Illness 32 y.o. 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  Pt with significant PMH of PTSD.      PT Comments    Pt pleasant and very willing to mobilize. Pt reportedly fearful of mobilizing out of room without therapy and assured pt that nursing staff are capable to help and assist and she is capable to moving with assist only for lines. Pt continues to rely on her neck pillow as her comfort item for mobility and encouraged to not carry it with her as it does not provide support. Pt able to perform gait and stairs and is functionally able to return home.    Follow Up Recommendations  No PT follow up;Supervision for mobility/OOB     Equipment Recommendations  None recommended by PT    Recommendations for Other Services       Precautions / Restrictions Precautions Precautions: Fall    Mobility  Bed Mobility Overal bed mobility: Modified Independent             General bed mobility comments: HOB 30degrees  Transfers Overall transfer level: Modified independent               General transfer comment: pt able to stand from bed and recliner without assist and with awareness for lines and safety  Ambulation/Gait Ambulation/Gait assistance: Supervision Ambulation Distance (Feet): 300 Feet Assistive device: None Gait Pattern/deviations: Step-through pattern;Decreased stride length   Gait velocity interpretation: Below normal speed for age/gender General Gait Details: pt with slow cautious gait with encouragement to increase distance and function. Pt with improved activity tolerance and speed today but remains cautious with decreased arm swing   Stairs Stairs: Yes   Stair Management: Alternating  pattern;Forwards;One rail Right Number of Stairs: 11 General stair comments: pt able to perform with use of rail without difficulty, LOB or need for assist  Wheelchair Mobility    Modified Rankin (Stroke Patients Only)       Balance Overall balance assessment: No apparent balance deficits (not formally assessed)                                          Cognition Arousal/Alertness: Awake/alert Behavior During Therapy: WFL for tasks assessed/performed Overall Cognitive Status: Within Functional Limits for tasks assessed                                        Exercises      General Comments        Pertinent Vitals/Pain Pain Score: 4  Pain Location: posterior neck and head Pain Descriptors / Indicators: Aching Pain Intervention(s): Limited activity within patient's tolerance;Repositioned;Monitored during session    Home Living                      Prior Function            PT Goals (current goals can now be found in the care plan section) Progress towards PT goals: Progressing toward goals    Frequency           PT Plan Current plan remains  appropriate    Co-evaluation              AM-PAC PT "6 Clicks" Daily Activity  Outcome Measure  Difficulty turning over in bed (including adjusting bedclothes, sheets and blankets)?: A Little Difficulty moving from lying on back to sitting on the side of the bed? : A Little Difficulty sitting down on and standing up from a chair with arms (e.g., wheelchair, bedside commode, etc,.)?: A Little Help needed moving to and from a bed to chair (including a wheelchair)?: None Help needed walking in hospital room?: None Help needed climbing 3-5 steps with a railing? : None 6 Click Score: 21    End of Session Equipment Utilized During Treatment: Gait belt Activity Tolerance: Patient tolerated treatment well Patient left: in chair;with call bell/phone within reach Nurse  Communication: Mobility status PT Visit Diagnosis: Other abnormalities of gait and mobility (R26.89)     Time: 1135-1150 PT Time Calculation (min) (ACUTE ONLY): 15 min  Charges:  $Gait Training: 8-22 mins                    G Codes:       Delaney Meigs, PT [phone]    Delmon Andrada B Sydell Prowell 09/13/2017, 1:01 PM

## 2017-09-13 NOTE — Progress Notes (Signed)
Patient arrived to room, vitals stable. Oriented to room/unit. Questions answered. Continue to monitor patient.

## 2017-09-13 NOTE — Progress Notes (Signed)
OT Cancellation Note  Patient Details Name: Angela FredricksonJeronae Mosley MRN: [id number] DOB: 1985-12-09   Cancelled Treatment:    Reason Eval/Treat Not Completed: Patient at procedure or test/ unavailable.  Pt going for procedure.  Will reattempt.  Dasia Guerrier Strangonarpe, OTR/L [phone]   Jeani HawkingConarpe, Lyam Provencio M 09/13/2017, 3:16 PM

## 2017-09-14 NOTE — Evaluation (Signed)
Occupational Therapy Evaluation and Discharge Summary Patient Details Name: Angela Mosley MRN: [id number] DOB: [birth year] Today's Date: 09/14/2017    History of Present Illness 32 y.o. 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  Pt with significant PMH of PTSD.     Clinical Impression   Pt admitted for above surgery and overall is doing very well. Pt can perform all basic adls without assist. Pt moves very slowly and tends to self limit. Spoke with pt about attempting to do most adls on her own as she is moving well.  Pt will d/c home with fiance and mother and pt's 5 children ranging in age from 66-14. Fiance works full time as Naval architect but will take a week off when she is d/c'd.  Mother lives with her and can help but has a significant cardiac history herself.  Do not feel pt needs cont OT but must be encouraged to do for herself.     Follow Up Recommendations  No OT follow up;Supervision/Assistance - 24 hour    Equipment Recommendations  3 in 1 bedside commode    Recommendations for Other Services       Precautions / Restrictions Precautions Precautions: None Precaution Comments: mildly unsteady on her feet with limited endurance Restrictions Weight Bearing Restrictions: No      Mobility Bed Mobility Overal bed mobility: Modified Independent             General bed mobility comments: HOB 30degrees  Transfers Overall transfer level: Modified independent Equipment used: None Transfers: Sit to/from Stand Sit to Stand: Supervision         General transfer comment: pt able to stand from bed and recliner without assist and with awareness for lines and safety    Balance Overall balance assessment: No apparent balance deficits (not formally assessed) Sitting-balance support: Feet supported;No upper extremity supported Sitting balance-Leahy  Scale: Good     Standing balance support: No upper extremity supported Standing balance-Leahy Scale: Good Standing balance comment: able to preform peri care (wash up) in standing with only supervision for safety                           ADL either performed or assessed with clinical judgement   ADL Overall ADL's : Needs assistance/impaired Eating/Feeding: Set up;Sitting Eating/Feeding Details (indicate cue type and reason): Pt has pain looking down so sometimes requires assist with set up.  Can manage if she has to. Grooming: Wash/dry hands;Wash/dry face;Oral care;Brushing hair;Minimal assistance;Standing Grooming Details (indicate cue type and reason): Min assist needed with hair. Pt fatigues with hands over head for too long managing hair.  Sister to assist with this at home. Upper Body Bathing: Set up;Sitting   Lower Body Bathing: Set up;Sit to/from stand   Upper Body Dressing : Set up;Sitting Upper Body Dressing Details (indicate cue type and reason): has to do buttons by feel as looking down is painful. Lower Body Dressing: Set up;Sit to/from stand   Toilet Transfer: Min guard;Ambulation;Comfort height toilet;BSC Toilet Transfer Details (indicate cue type and reason): Pt walked to bathroom to toilet.  Min guard only needed to get to toilet. Toileting- Clothing Manipulation and Hygiene: Supervision/safety;Sit to/from stand Toileting - Clothing Manipulation Details (indicate cue type and reason): Pt has to be encouraged to do more for herself but does when asked.   Tub/ Shower Transfer: Tub Metallurgist  Details (indicate cue type and reason): Spoke at length about how pt can get into tub shower at home and where to hold on to.  Recommend pt get in shower for first time when someone else is with her.   Functional mobility during ADLs: Supervision/safety General ADL Comments: Pt ambulates very slowly     Vision Baseline Vision/History: Wears  glasses Wears Glasses: At all times Patient Visual Report: No change from baseline Vision Assessment?: No apparent visual deficits     Perception     Praxis      Pertinent Vitals/Pain Pain Assessment: 0-10 Pain Score: 6  Pain Location: posterior neck and head Pain Descriptors / Indicators: Aching Pain Intervention(s): Monitored during session;Premedicated before session;Repositioned     Hand Dominance Right   Extremity/Trunk Assessment Upper Extremity Assessment Upper Extremity Assessment: Overall WFL for tasks assessed   Lower Extremity Assessment Lower Extremity Assessment: Defer to PT evaluation   Cervical / Trunk Assessment Cervical / Trunk Assessment: Other exceptions Cervical / Trunk Exceptions: s/p upper cervical surgery   Communication Communication Communication: No difficulties   Cognition Arousal/Alertness: Awake/alert Behavior During Therapy: WFL for tasks assessed/performed Overall Cognitive Status: Within Functional Limits for tasks assessed                                 General Comments: Pt self limits.  Pt able to do more when distracted by conversation.   General Comments  Pt moves well but moves slowly. Pt encouraged to walk slightly faster.  Pt became SOB, but when distracted by conversation and questions, pt did not fatigue or become SOB.  Feel pt is anxious about mobility but currently doing well.    Exercises     Shoulder Instructions      Home Living Family/patient expects to be discharged to:: Private residence Living Arrangements: Spouse/significant other;Parent;Children Available Help at Discharge: Family;Available 24 hours/day Type of Home: House Home Access: Stairs to enter [employer] of Steps: 1 Entrance Stairs-Rails: None Home Layout: Two level Alternate Level Stairs-Number of Steps: 14 Alternate Level Stairs-Rails: Right;Left;Can reach both Bathroom Shower/Tub: Chief Strategy Officer:  Standard     Home Equipment: None   Additional Comments: needs 3:1 commode      Prior Functioning/Environment Level of Independence: Independent        Comments: drives, 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         OT Problem List:        OT Treatment/Interventions:      OT Goals(Current goals can be found in the care plan section) Acute Rehab OT Goals Patient Stated Goal: to get back to normal, stop HA and other symptoms OT Goal Formulation: All assessment and education complete, DC therapy  OT Frequency:     Barriers to D/C:            Co-evaluation              AM-PAC PT "6 Clicks" Daily Activity     Outcome Measure Help from another person eating meals?: None Help from another person taking care of personal grooming?: None Help from another person toileting, which includes using toliet, bedpan, or urinal?: None Help from another person bathing (including washing, rinsing, drying)?: None Help from another person to put on and taking off regular  upper body clothing?: None Help from another person to put on and taking off regular lower body clothing?: None 6 Click Score: 24   End of Session Nurse Communication: Mobility status  Activity Tolerance: Patient tolerated treatment well Patient left: in chair;with call bell/phone within reach;with family/visitor present  OT Visit Diagnosis: Unsteadiness on feet (R26.81);Pain Pain - part of body: (neck)                Time: [id number] OT Time Calculation (min): 26 min Charges:  OT General Charges $OT Visit: 1 Visit OT Evaluation $OT Eval Moderate Complexity: 1 Mod OT Treatments $Self Care/Home Management : 8-22 mins G-Codes:     Tory EmeraldHolly Ceasar Decandia, OTR/L [phone]  Hope BuddsJones, Fortune Brannigan Anne 09/14/2017, 10:09 AM

## 2017-09-14 NOTE — Progress Notes (Signed)
Physical Therapy Treatment Patient Details Name: Angela Mosley MRN: [id number] DOB: 05-19-1986 Today's Date: 09/14/2017    History of Present Illness 32 y.o. 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  Pt with significant PMH of PTSD.      PT Comments    Pt continues to move and progress well but is limited by anxiety and benefits from encouragement and distraction to fully mobilize. Pt able to walk without use of neck pillow today and educated for need to mobilize without PT, increase gait, perform stairs at home and reinforced no need for physical assist with holding head with mobility. Will continue to follow. SpO2 99% on RA throughout    Follow Up Recommendations  No PT follow up;Supervision for mobility/OOB     Equipment Recommendations  None recommended by PT    Recommendations for Other Services       Precautions / Restrictions Precautions Precautions: None    Mobility  Bed Mobility Overal bed mobility: Modified Independent             General bed mobility comments: HOB 30degrees  Transfers Overall transfer level: Modified independent                  Ambulation/Gait Ambulation/Gait assistance: Supervision Ambulation Distance (Feet): 325 Feet Assistive device: None Gait Pattern/deviations: Step-through pattern;Decreased stride length   Gait velocity interpretation: Below normal speed for age/gender General Gait Details: slow and cautious gait with cues to increase speed and talking for distraction throughout gait to improve activity tolerance.    Stairs            Wheelchair Mobility    Modified Rankin (Stroke Patients Only)       Balance Overall balance assessment: No apparent balance deficits (not formally assessed)                                          Cognition Arousal/Alertness:  Awake/alert Behavior During Therapy: WFL for tasks assessed/performed Overall Cognitive Status: Within Functional Limits for tasks assessed                                        Exercises      General Comments        Pertinent Vitals/Pain Pain Score: 5  Pain Location: posterior neck and head Pain Descriptors / Indicators: Aching Pain Intervention(s): Limited activity within patient's tolerance;Repositioned    Home Living                      Prior Function            PT Goals (current goals can now be found in the care plan section) Progress towards PT goals: Progressing toward goals    Frequency    Min 4X/week      PT Plan Current plan remains appropriate;Frequency needs to be updated    Co-evaluation              AM-PAC PT "6 Clicks" Daily Activity  Outcome Measure  Difficulty turning over in bed (including adjusting bedclothes, sheets and blankets)?: A Little Difficulty moving from lying on back to sitting on the side of the bed? : A Little Difficulty sitting down  on and standing up from a chair with arms (e.g., wheelchair, bedside commode, etc,.)?: A Little Help needed moving to and from a bed to chair (including a wheelchair)?: None Help needed walking in hospital room?: None Help needed climbing 3-5 steps with a railing? : None 6 Click Score: 21    End of Session   Activity Tolerance: Patient tolerated treatment well Patient left: in chair;with call bell/phone within reach;with family/visitor present Nurse Communication: Mobility status PT Visit Diagnosis: Other abnormalities of gait and mobility (R26.89)     Time: [id number] PT Time Calculation (min) (ACUTE ONLY): 30 min  Charges:  $Gait Training: 8-22 mins $Therapeutic Activity: 8-22 mins                    G Codes:       Angela Mosley, PT [phone]    Angela Mosley 09/14/2017, 8:52 AM

## 2017-09-14 NOTE — Progress Notes (Signed)
Patient ID: Hilarie FredricksonJeronae Mosley, female   DOB: 03-May-1986, 32 y.o.   MRN: [id number] BP 124/85 (BP Location: Left Arm)   Pulse 77   Temp 97.8 F (36.6 C) (Oral)   Resp 16   Ht 5\' 2"  (1.575 m)   Wt 91.2 kg (201 lb)   LMP 08/30/2017   SpO2 100%   BMI 36.76 kg/m  Alert and oriented x 4,speech is clear and fluent Moving all extremities well Wound is clean,dry, and without signs of infection Probable discharge tomorrow.

## 2017-09-14 NOTE — Care Management Note (Signed)
Case Management Note  Patient Details  Name: Angela Mosley MRN: [id number] Date of Birth: 06/05/1986  Subjective/Objective:   32 y.o. 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  PTA, pt independent, lives with spouse and other family members.                    Action/Plan: PT/OT recommending no OP follow up.  May need BSC as recommended by OT.  Will follow progress.   Expected Discharge Date:                  Expected Discharge Plan:  Home/Self Care  In-House Referral:     Discharge planning Services  CM Consult  Post Acute Care Choice:    Choice offered to:     DME Arranged:    DME Agency:     HH Arranged:    HH Agency:     Status of Service:  In process, will continue to follow  If discussed at Long Length of Stay Meetings, dates discussed:    Additional Comments:  Quintella BatonJulie W. Saia Derossett, RN, BSN  Trauma/Neuro ICU Case Manager [phone]

## 2017-09-15 NOTE — Care Management Note (Signed)
Case Management Note  Patient Details  Name: Angela Mosley MRN: [id number] Date of Birth: July 04, 1986  Subjective/Objective:   32 y.o. 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  PTA, pt independent, lives with spouse and other family members.                    Action/Plan: PT/OT recommending no OP follow up.  May need BSC as recommended by OT.  Will follow progress.   Expected Discharge Date:  09/15/17               Expected Discharge Plan:  Home/Self Care  In-House Referral:     Discharge planning Services  CM Consult  Post Acute Care Choice:    Choice offered to:     DME Arranged:  3-N-1, Shower stool DME Agency:  [facility]  [facility]     Status of Service:  Completed, signed off  If discussed at MicrosoftLong Length of Stay Meetings, dates discussed:    Additional Comments:  09/15/17 J. Jae Skeet, RN, BSN Pt medically stable for [employer] [facility] today, per MD.  Pt lives at home with fiance, mother and her 5 children, ages 446-14.  Pt concerned as her mother has had an MI in the past; she states mom can assist with care.   Pt requesting HHRN and perhaps aide for home, but currently has no skilled need for home nursing.  Therapy notes state pt needs no follow up therapies and that pt is moving well.  She was able to perform stairs without difficulty on 09/14/17 with therapist.  Provided pt with resources for obtaining private duty/sitter, if desired.  [facility].  Notified Lawrence SantiagoMarlienne Goldin that pt would like to speak with her to follow up on a staff incident earlier in the week.  She states she will follow up with pt prior to her dc home.  Pt verbalizes understanding of why she does not qualify for home health care.    Quintella BatonJulie W. Patrice Moates, RN, BSN  Trauma/Neuro ICU Case Manager [phone]

## 2017-09-15 NOTE — Progress Notes (Signed)
Pt asked RN to place small gauze over her sutures because she felt uncomfortable with it being opened to air. Small gauze placed with paper tape. Will continue to monitor

## 2017-09-16 NOTE — Discharge Summary (Signed)
Physician Discharge Summary  Patient ID: Angela Mosley MRN: [id number] DOB/AGE: 09/23/1985 32 y.o.  Admit date: 09/09/2017 Discharge date: 09/16/2017  Admission Diagnoses:chiari malformation type 1  Discharge Diagnoses:  Active Problems:   Chiari malformation type I [facility](HCC)   Discharged Condition: good  Hospital Course: [name] was admitted and taken to the operating room for an uncomplicated suboccipital decompression and C1 laminectomy, and dural patch grafting. Post op she was very slow to mobilize, though she did improve during the hospitalization. She complained of a noise that she could hear behind her ears. I had a Head Ct performed which was negative for any acute problems. She continued to improve, worked with PT, OT, and was eventually discharged to home. Her wound was clean, dry, and without signs of infection. [name] had normal strength, a normal gait. Perrl full eom, tongue and uvula midline, symmetric facial sensation and movements. [name] was able to void, ambulate, and tolerate a regular diet.   Treatments: surgery: as above  Discharge Exam: Blood pressure 130/88, pulse 80, temperature 98.2 F (36.8 C), temperature source Oral, resp. rate 18, height 5\' 2"  (1.575 m), weight 91.2 kg (201 lb), last menstrual period 08/30/2017, SpO2 100 %. General appearance: alert, cooperative, appears stated age and mild distress  Disposition: 01-Home or Self Care CHIARI MALFORMATION TYPE I  Allergies as of 09/15/2017      Reactions   Latex Hives   Latuda [lurasidone Hcl] Hives, Itching, Other (See Comments)   Caused throat to itch badly   Oxycodone-acetaminophen Hives   Pt states the reaction may have been due to her laundry detergent.   Phenergan [promethazine] Other (See Comments)   Caused aggression and rage   Reglan [metoclopramide] Other (See Comments)   Aggressive and angry   Tape Hives   Hydrocodone Hives, Itching      Medication List     TAKE these medications   acetaminophen 500 MG tablet Commonly known as:  TYLENOL Take 1,000 mg by mouth every 6 (six) hours as needed (for headaches).   CARAFATE 1 GM/10ML suspension Generic drug:  sucralfate Take 10 mLs (1 g total) by mouth 4 (four) times daily - with meals and at bedtime.   ibuprofen 200 MG tablet Commonly known as:  ADVIL,MOTRIN Take 200 mg by mouth every 6 (six) hours as needed.   ondansetron 4 MG tablet Commonly known as:  ZOFRAN Take 1 tablet (4 mg total) by mouth every 8 (eight) hours as needed for nausea or vomiting.   oxyCODONE-acetaminophen 7.5-325 MG tablet Commonly known as:  PERCOCET Take 1 tablet by mouth every 8 (eight) hours as needed for moderate pain or severe pain (Take only as needed for pain).   pantoprazole 40 MG tablet Commonly known as:  PROTONIX Take 1 tablet (40 mg total) by mouth 2 (two) times daily.   pantoprazole 40 MG tablet Commonly known as:  PROTONIX Take 1 tablet (40 mg total) daily by mouth.   valACYclovir 1000 MG tablet Commonly known as:  VALTREX Take 1 tablet (1,000 mg total) by mouth daily. What changed:    when to take this  reasons to take this        Signed: Jaya Lapka L 09/16/2017, 8:46 PM

## 2017-09-20 ENCOUNTER — Encounter (HOSPITAL_COMMUNITY): Payer: Self-pay

## 2017-09-20 ENCOUNTER — Other Ambulatory Visit: Payer: Self-pay

## 2017-09-20 ENCOUNTER — Emergency Department (HOSPITAL_COMMUNITY)
Admission: EM | Admit: 2017-09-20 | Discharge: 2017-09-20 | Disposition: A | Discharge status: Home / Self Care | Payer: Medicaid Other | Source: Home / Self Care | Attending: Emergency Medicine | Admitting: Emergency Medicine

## 2017-09-20 ENCOUNTER — Emergency Department (HOSPITAL_COMMUNITY): Payer: Medicaid Other

## 2017-09-20 DIAGNOSIS — Z87891 Personal history of nicotine dependence: Secondary | ICD-10-CM | POA: Insufficient documentation

## 2017-09-20 DIAGNOSIS — Z8669 Personal history of other diseases of the nervous system and sense organs: Secondary | ICD-10-CM

## 2017-09-20 DIAGNOSIS — Z79899 Other long term (current) drug therapy: Secondary | ICD-10-CM

## 2017-09-20 DIAGNOSIS — R519 Headache, unspecified: Secondary | ICD-10-CM

## 2017-09-20 DIAGNOSIS — R51 Headache: Secondary | ICD-10-CM | POA: Insufficient documentation

## 2017-09-20 LAB — CBC WITH DIFFERENTIAL/PLATELET
BASOS ABS: 0 10*3/uL (ref 0.0–0.1)
Basophils Relative: 0 %
Eosinophils Absolute: 0.1 10*3/uL (ref 0.0–0.7)
Eosinophils Relative: 1 %
HEMATOCRIT: 40.6 % (ref 36.0–46.0)
Hemoglobin: 13.1 g/dL (ref 12.0–15.0)
LYMPHS ABS: 2.7 10*3/uL (ref 0.7–4.0)
LYMPHS PCT: 26 %
MCH: 30.6 pg (ref 26.0–34.0)
MCHC: 32.3 g/dL (ref 30.0–36.0)
MCV: 94.9 fL (ref 78.0–100.0)
MONOS PCT: 8 %
Monocytes Absolute: 0.8 10*3/uL (ref 0.1–1.0)
NEUTROS ABS: 6.9 10*3/uL (ref 1.7–7.7)
Neutrophils Relative %: 65 %
PLATELETS: 342 10*3/uL (ref 150–400)
RBC: 4.28 MIL/uL (ref 3.87–5.11)
RDW: 12.7 % (ref 11.5–15.5)
WBC: 10.5 10*3/uL (ref 4.0–10.5)

## 2017-09-20 LAB — BASIC METABOLIC PANEL
ANION GAP: 12 (ref 5–15)
BUN: 10 mg/dL (ref 6–20)
CHLORIDE: 100 mmol/L — AB (ref 101–111)
CO2: 22 mmol/L (ref 22–32)
Calcium: 9.2 mg/dL (ref 8.9–10.3)
Creatinine, Ser: 0.73 mg/dL (ref 0.44–1.00)
GFR calc Af Amer: >60 mL/min (ref >60)
GFR calc non Af Amer: >60 mL/min (ref >60)
GLUCOSE: 115 mg/dL — AB (ref 65–99)
POTASSIUM: 4.1 mmol/L (ref 3.5–5.1)
Sodium: 134 mmol/L — ABNORMAL LOW (ref 135–145)

## 2017-09-20 MED ORDER — KETOROLAC TROMETHAMINE 30 MG/ML IJ SOLN
30.0000 mg | Freq: Once | INTRAMUSCULAR | Status: AC
  Administered 2017-09-20: 30 mg via INTRAVENOUS
  Filled 2017-09-20: qty 1

## 2017-09-20 MED ORDER — HYDROMORPHONE HCL 1 MG/ML IJ SOLN
1.0000 mg | Freq: Once | INTRAMUSCULAR | Status: AC
  Administered 2017-09-20: 1 mg via INTRAVENOUS
  Filled 2017-09-20: qty 1

## 2017-09-20 MED ORDER — HYDROMORPHONE HCL 1 MG/ML IJ SOLN
1.0000 mg | Freq: Once | INTRAMUSCULAR | Status: AC
Start: 2017-09-20 — End: 2017-09-20
  Administered 2017-09-20: 1 mg via INTRAVENOUS
  Filled 2017-09-20: qty 1

## 2017-09-20 MED ORDER — ONDANSETRON HCL 4 MG/2ML IJ SOLN
4.0000 mg | Freq: Once | INTRAMUSCULAR | Status: AC
  Administered 2017-09-20: 4 mg via INTRAVENOUS
  Filled 2017-09-20: qty 2

## 2017-09-20 NOTE — ED Notes (Signed)
Delo, MD notified of pt arrival and presentation.

## 2017-09-20 NOTE — ED Triage Notes (Addendum)
Per [facility], pt arriving from home for severe left sided headache that feels like pressure, headache increases with light and pt c/o blurry vision. EMS denies neuro deficits. Pt had these symptoms since February 1st and then had surgery on February 14th for Chiari malformation. EMS reports when pt pain comes her HR goes to 140's-150's. Pt received 50 mcg of fentanyl via EMS with no relief. Pt reports seeing a white pus coming from incision yesterday. No drainage currently visible.

## 2017-09-20 NOTE — ED Notes (Signed)
PT states understanding of care given, follow up care. PT ambulated from ED to car with a steady gait.  

## 2017-09-20 NOTE — Discharge Instructions (Signed)
Continue your medication as previously prescribed.  Follow-up with Dr. Franky Machoabbell as scheduled.

## 2017-09-20 NOTE — ED Provider Notes (Signed)
[facility] Provider Note   CSN: [id number] Arrival date & time: 09/20/17  0351     History   Chief Complaint Chief Complaint  Patient presents with  . Headache    HPI Hilarie FredricksonJeronae Pearman is a 32 y.o. female.  Patient is a 32 year old female with past medical history of anxiety, depression, bipolar, PTSD, and recently diagnosed Chiari malformation.  She underwent surgery on February 14 for repair.  She has been experiencing severe headache since earlier this evening.  This began in the absence of any injury or trauma.  She reports pain in the back of her head that she describes as a pressure.  She has been taking her home medications with little relief.  She denies any visual disturbances, numbness, tingling.   The history is provided by the patient.  Headache   This is a new problem. The current episode started 3 to 5 hours ago. The problem occurs constantly. The problem has been rapidly worsening. The headache is associated with nothing. The quality of the pain is described as throbbing. The pain is severe. The pain does not radiate. Treatments tried: Oxycodone. The treatment provided no relief.    Past Medical History:  Diagnosis Date  . Anxiety   . Depression   . GERD (gastroesophageal reflux disease)   . Headache   . Herpes   . Hyperemesis gravidarum   . PTSD (post-traumatic stress disorder)     Patient Active Problem List   Diagnosis Date Noted  . Chiari malformation type I (HCC) 09/09/2017  . Bad headache   . Lightheadedness   . Nausea   . Visual disturbance   . Idiopathic intracranial hypertension 08/29/2017  . IIH (idiopathic intracranial hypertension) 08/17/2017  . Bipolar 2 disorder (HCC) 07/12/2017  . Pleuritic chest pain 06/07/2017  . Gastroesophageal reflux disease without esophagitis 06/07/2017  . URI (upper respiratory infection) 03/15/2017  . Acute gastritis without bleeding 01/12/2017  . Vaginal discharge  11/17/2016  . 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 PTSD (post-traumatic stress disorder) 02/25/2013  . 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 Cannabis abuse 02/25/2013    Past Surgical History:  Procedure Laterality Date  . MOUTH SURGERY  2005  . SUBOCCIPITAL CRANIECTOMY CERVICAL LAMINECTOMY N/A 09/09/2017   Procedure: CHIARI DECOMPRESSION;  Surgeon: Coletta Memosabbell, Kyle, MD;  [facility];  Service: Neurosurgery;  Laterality: N/A;  . TUBAL LIGATION  2012    OB History    Gravida Para Term Preterm AB Living   5 5 5  0 0 5   SAB TAB Ectopic Multiple Live Births   0 0 0 0 5       Home Medications    Prior to Admission medications   Medication Sig Start Date End Date Taking? Authorizing Provider  acetaminophen (TYLENOL) 500 MG tablet Take 1,000 mg by mouth every 6 (six) hours as needed (for headaches).    [provider]  CARAFATE 1 GM/10ML suspension Take 10 mLs (1 g total) by mouth 4 (four) times daily - with meals and at bedtime. 07/01/17   Freddrick MarchAmin, Yashika, MD  ibuprofen (ADVIL,MOTRIN) 200 MG tablet Take 200 mg by mouth every 6 (six) hours as needed.    [provider]  ondansetron (ZOFRAN) 4 MG tablet Take 1 tablet (4 mg total) by mouth every 8 (eight) hours as needed for nausea or vomiting. 09/02/17   Freddrick MarchAmin, Yashika, MD  oxyCODONE-acetaminophen (PERCOCET) 7.5-325 MG tablet Take 1  tablet by mouth every 8 (eight) hours as needed for moderate pain or severe pain (Take only as needed for pain). 08/31/17 08/31/18  Garnette Gunner, MD  pantoprazole (PROTONIX) 40 MG tablet Take 1 tablet (40 mg total) by mouth 2 (two) times daily. 08/09/17   Rachael Fee, MD  pantoprazole (PROTONIX) 40 MG tablet Take 1 tablet (40 mg total) daily by mouth. 09/02/17   [name], MD  valACYclovir (VALTREX) 1000 MG tablet Take 1 tablet (1,000 mg total) by mouth daily. Patient taking differently: Take 1,000 mg by mouth daily as  needed (for flares).  07/01/17   [name], MD    Family History Family History  Problem Relation Age of Onset  . Diabetes Mother   . Hypertension Mother   . Heart attack Mother   . Hyperlipidemia Mother   . Pulmonary embolism Mother   . Cataracts Mother   . Glaucoma Mother   . Heart attack Brother   . Diabetes Maternal Grandmother   . Diabetes Maternal Grandfather   . Heart disease Maternal Grandfather   . Prostate cancer Paternal Grandfather   . Pancreatic cancer Paternal Grandfather   . Stomach cancer Paternal Grandfather        mets all to back and lung  . Cerebral aneurysm Paternal Aunt     Social History Social History   Tobacco Use  . Smoking status: Former Smoker    Packs/day: 0.50    Years: 10.00    Pack years: 5.00    Types: Cigarettes    Last attempt to quit: 03/11/2016    Years since quitting: 1.5  . Smokeless tobacco: Never Used  Substance Use Topics  . Alcohol use: No  . Drug use: Yes    Types: Marijuana     Allergies   Latex; Latuda [lurasidone hcl]; Oxycodone-acetaminophen; Phenergan [promethazine]; Reglan [metoclopramide]; Tape; and Hydrocodone   Review of Systems Review of Systems  Neurological: Positive for headaches.  All other systems reviewed and are negative.    Physical Exam Updated Vital Signs BP 111/82   Pulse (!) 125   Temp 98.8 F (37.1 C) (Oral)   Resp (!) 24   Ht 5\' 2"  (1.575 m)   Wt 92.5 kg (204 lb)   LMP 08/30/2017   SpO2 100%   BMI 37.31 kg/m   Physical Exam  Constitutional: She is oriented to person, place, and time. She appears well-developed and well-nourished. No distress.  HENT:  Head: Normocephalic and atraumatic.  Eyes: EOM are normal. Pupils are equal, round, and reactive to light.  Neck: Normal range of motion. Neck supple.  Cardiovascular: Normal rate and regular rhythm. Exam reveals no gallop and no friction rub.  No murmur heard. Pulmonary/Chest: Effort normal and breath sounds normal. No  respiratory distress. She has no wheezes.  Abdominal: Soft. Bowel sounds are normal. She exhibits no distension. There is no tenderness.  Musculoskeletal: Normal range of motion.  Neurological: She is alert and oriented to person, place, and time. She has normal strength. No cranial nerve deficit.  Skin: Skin is warm and dry. She is not diaphoretic.  Nursing note and vitals reviewed.    ED Treatments / Results  Labs (all labs ordered are listed, but only abnormal results are displayed) Labs Reviewed  BASIC METABOLIC PANEL  CBC WITH DIFFERENTIAL/PLATELET    EKG  EKG Interpretation None       Radiology No results found.  Procedures Procedures (including critical care time)  Medications Ordered in ED Medications  HYDROmorphone (DILAUDID) injection 1 mg (not administered)     Initial Impression / Assessment and Plan / ED Course  I have reviewed the triage vital signs and the nursing notes.  Pertinent labs & imaging results that were available during my care of the patient were reviewed by me and considered in my medical decision making (see chart for details).  Patient with history of surgery to correct a Chiari malformation 2 weeks ago.  She presents today with complaints of severe pain to the back of her head.  Her CT scan today shows no evidence for any complication.  She is feeling better after medications given in the ER.  I have discussed this patient with Dr. Franky Macho who does not feel as though any emergent intervention is necessary.  The plan is for pain control and discharged to home.  She has received several doses of pain medication while in the ED along with Toradol and appears to be feeling better.  Final Clinical Impressions(s) / ED Diagnoses   Final diagnoses:  None    ED Discharge Orders    None       Geoffery Lyons, MD 09/20/17 [phone]

## 2017-09-21 ENCOUNTER — Encounter (HOSPITAL_COMMUNITY): Payer: Self-pay | Admitting: *Deleted

## 2017-09-21 ENCOUNTER — Inpatient Hospital Stay (HOSPITAL_COMMUNITY): Payer: Medicaid Other

## 2017-09-21 ENCOUNTER — Other Ambulatory Visit: Payer: Self-pay | Admitting: Neurosurgery

## 2017-09-21 ENCOUNTER — Inpatient Hospital Stay (HOSPITAL_COMMUNITY)
Admission: AD | Admit: 2017-09-21 | Discharge: 2017-09-30 | DRG: 862 | Disposition: A | Discharge status: Home / Self Care | Payer: Medicaid Other | Source: Ambulatory Visit | Attending: Neurosurgery | Admitting: Neurosurgery

## 2017-09-21 ENCOUNTER — Encounter (HOSPITAL_COMMUNITY)
Admission: AD | Disposition: A | Discharge status: Home / Self Care | Payer: Self-pay | Source: Ambulatory Visit | Attending: Neurosurgery

## 2017-09-21 ENCOUNTER — Other Ambulatory Visit: Payer: Self-pay

## 2017-09-21 DIAGNOSIS — Y838 Other surgical procedures as the cause of abnormal reaction of the patient, or of later complication, without mention of misadventure at the time of the procedure: Secondary | ICD-10-CM | POA: Diagnosis present

## 2017-09-21 DIAGNOSIS — G935 Compression of brain: Secondary | ICD-10-CM | POA: Diagnosis not present

## 2017-09-21 DIAGNOSIS — E669 Obesity, unspecified: Secondary | ICD-10-CM | POA: Diagnosis not present

## 2017-09-21 DIAGNOSIS — T8141XA Infection following a procedure, superficial incisional surgical site, initial encounter: Secondary | ICD-10-CM | POA: Diagnosis present

## 2017-09-21 DIAGNOSIS — Z888 Allergy status to other drugs, medicaments and biological substances status: Secondary | ICD-10-CM

## 2017-09-21 DIAGNOSIS — T8149XA Infection following a procedure, other surgical site, initial encounter: Secondary | ICD-10-CM | POA: Diagnosis present

## 2017-09-21 DIAGNOSIS — Z91040 Latex allergy status: Secondary | ICD-10-CM | POA: Diagnosis not present

## 2017-09-21 DIAGNOSIS — Z978 Presence of other specified devices: Secondary | ICD-10-CM | POA: Diagnosis not present

## 2017-09-21 DIAGNOSIS — Z885 Allergy status to narcotic agent status: Secondary | ICD-10-CM

## 2017-09-21 DIAGNOSIS — B957 Other staphylococcus as the cause of diseases classified elsewhere: Secondary | ICD-10-CM | POA: Diagnosis not present

## 2017-09-21 DIAGNOSIS — G932 Benign intracranial hypertension: Secondary | ICD-10-CM | POA: Diagnosis not present

## 2017-09-21 DIAGNOSIS — K219 Gastro-esophageal reflux disease without esophagitis: Secondary | ICD-10-CM | POA: Diagnosis present

## 2017-09-21 DIAGNOSIS — Z87891 Personal history of nicotine dependence: Secondary | ICD-10-CM | POA: Diagnosis not present

## 2017-09-21 DIAGNOSIS — Z6837 Body mass index (BMI) 37.0-37.9, adult: Secondary | ICD-10-CM | POA: Diagnosis not present

## 2017-09-21 DIAGNOSIS — G960 Cerebrospinal fluid leak: Secondary | ICD-10-CM | POA: Diagnosis present

## 2017-09-21 DIAGNOSIS — F319 Bipolar disorder, unspecified: Secondary | ICD-10-CM | POA: Diagnosis not present

## 2017-09-21 DIAGNOSIS — G479 Sleep disorder, unspecified: Secondary | ICD-10-CM | POA: Diagnosis present

## 2017-09-21 DIAGNOSIS — F419 Anxiety disorder, unspecified: Secondary | ICD-10-CM | POA: Diagnosis present

## 2017-09-21 DIAGNOSIS — Z91048 Other nonmedicinal substance allergy status: Secondary | ICD-10-CM

## 2017-09-21 DIAGNOSIS — R51 Headache: Secondary | ICD-10-CM | POA: Diagnosis not present

## 2017-09-21 DIAGNOSIS — F4310 Post-traumatic stress disorder, unspecified: Secondary | ICD-10-CM | POA: Diagnosis present

## 2017-09-21 DIAGNOSIS — H9311 Tinnitus, right ear: Secondary | ICD-10-CM | POA: Diagnosis present

## 2017-09-21 DIAGNOSIS — Z4801 Encounter for change or removal of surgical wound dressing: Secondary | ICD-10-CM | POA: Diagnosis present

## 2017-09-21 HISTORY — PX: WOUND EXPLORATION: SHX6188

## 2017-09-21 LAB — POCT PREGNANCY, URINE: PREG TEST UR: NEGATIVE

## 2017-09-21 SURGERY — WOUND EXPLORATION
Anesthesia: General | Site: Neck

## 2017-09-21 MED ORDER — BUPIVACAINE HCL (PF) 0.5 % IJ SOLN
INTRAMUSCULAR | Status: DC | PRN
  Administered 2017-09-21: 5 mL

## 2017-09-21 MED ORDER — PROPOFOL 10 MG/ML IV BOLUS
INTRAVENOUS | Status: DC | PRN
  Administered 2017-09-21: 130 mg via INTRAVENOUS

## 2017-09-21 MED ORDER — GLYCOPYRROLATE 0.2 MG/ML IJ SOLN
INTRAMUSCULAR | Status: DC | PRN
  Administered 2017-09-21: 0.6 mg via INTRAVENOUS

## 2017-09-21 MED ORDER — KETOROLAC TROMETHAMINE 15 MG/ML IJ SOLN
15.0000 mg | Freq: Four times a day (QID) | INTRAMUSCULAR | Status: DC
  Administered 2017-09-21: 15 mg via INTRAVENOUS

## 2017-09-21 MED ORDER — FENTANYL CITRATE (PF) 100 MCG/2ML IJ SOLN
25.0000 ug | INTRAMUSCULAR | Status: DC | PRN
  Administered 2017-09-21 (×3): 50 ug via INTRAVENOUS

## 2017-09-21 MED ORDER — BLISTEX MEDICATED EX OINT
TOPICAL_OINTMENT | CUTANEOUS | Status: DC | PRN
  Filled 2017-09-21: qty 6.3

## 2017-09-21 MED ORDER — WHITE PETROLATUM EX OINT: TOPICAL_OINTMENT | CUTANEOUS | Status: DC | PRN

## 2017-09-21 MED ORDER — CYCLOBENZAPRINE HCL 10 MG PO TABS
ORAL_TABLET | ORAL | Status: AC
  Administered 2017-09-21: 10 mg via ORAL
  Filled 2017-09-21: qty 1

## 2017-09-21 MED ORDER — MIDAZOLAM HCL 2 MG/2ML IJ SOLN
INTRAMUSCULAR | Status: AC
  Filled 2017-09-21: qty 2

## 2017-09-21 MED ORDER — MENTHOL 3 MG MT LOZG: 1.0000 | LOZENGE | OROMUCOSAL | Status: DC | PRN

## 2017-09-21 MED ORDER — SUCRALFATE 1 GM/10ML PO SUSP
1.0000 g | Freq: Three times a day (TID) | ORAL | Status: DC
  Administered 2017-09-21 – 2017-09-30 (×30): 1 g via ORAL
  Filled 2017-09-21 (×39): qty 10

## 2017-09-21 MED ORDER — PHENYLEPHRINE 40 MCG/ML (10ML) SYRINGE FOR IV PUSH (FOR BLOOD PRESSURE SUPPORT)
PREFILLED_SYRINGE | INTRAVENOUS | Status: AC
  Filled 2017-09-21: qty 20

## 2017-09-21 MED ORDER — ACETAMINOPHEN 650 MG RE SUPP: 650.0000 mg | RECTAL | Status: DC | PRN

## 2017-09-21 MED ORDER — THROMBIN (RECOMBINANT) 5000 UNITS EX SOLR
CUTANEOUS | Status: DC | PRN
  Administered 2017-09-21 (×2): 5000 [IU] via TOPICAL

## 2017-09-21 MED ORDER — OXYCODONE-ACETAMINOPHEN 7.5-325 MG PO TABS
1.0000 | ORAL_TABLET | Freq: Four times a day (QID) | ORAL | Status: DC | PRN
  Administered 2017-09-21 – 2017-09-25 (×14): 1 via ORAL
  Filled 2017-09-21 (×14): qty 1

## 2017-09-21 MED ORDER — BUPIVACAINE HCL (PF) 0.5 % IJ SOLN
INTRAMUSCULAR | Status: AC
  Filled 2017-09-21: qty 30

## 2017-09-21 MED ORDER — GABAPENTIN 300 MG PO CAPS
300.0000 mg | ORAL_CAPSULE | Freq: Three times a day (TID) | ORAL | Status: DC
  Administered 2017-09-21 – 2017-09-30 (×27): 300 mg via ORAL
  Filled 2017-09-21 (×27): qty 1

## 2017-09-21 MED ORDER — ONDANSETRON HCL 4 MG PO TABS
4.0000 mg | ORAL_TABLET | Freq: Four times a day (QID) | ORAL | Status: DC | PRN
  Filled 2017-09-21: qty 1

## 2017-09-21 MED ORDER — SODIUM CHLORIDE 0.9% FLUSH: 3.0000 mL | INTRAVENOUS | Status: DC | PRN

## 2017-09-21 MED ORDER — FENTANYL CITRATE (PF) 100 MCG/2ML IJ SOLN
INTRAMUSCULAR | Status: DC | PRN
  Administered 2017-09-21: 100 ug via INTRAVENOUS
  Administered 2017-09-21 (×2): 50 ug via INTRAVENOUS

## 2017-09-21 MED ORDER — SENNOSIDES-DOCUSATE SODIUM 8.6-50 MG PO TABS
1.0000 | ORAL_TABLET | Freq: Every evening | ORAL | Status: DC | PRN
  Administered 2017-09-29: 1 via ORAL
  Filled 2017-09-21: qty 1

## 2017-09-21 MED ORDER — BISACODYL 5 MG PO TBEC: 5.0000 mg | DELAYED_RELEASE_TABLET | Freq: Every day | ORAL | Status: DC | PRN

## 2017-09-21 MED ORDER — LACTATED RINGERS IV SOLN
INTRAVENOUS | Status: DC
  Administered 2017-09-21: 16:00:00 via INTRAVENOUS

## 2017-09-21 MED ORDER — SODIUM CHLORIDE 0.9% FLUSH: 3.0000 mL | Freq: Two times a day (BID) | INTRAVENOUS | Status: DC

## 2017-09-21 MED ORDER — FENTANYL CITRATE (PF) 100 MCG/2ML IJ SOLN
INTRAMUSCULAR | Status: AC
  Administered 2017-09-21: 50 ug via INTRAVENOUS
  Filled 2017-09-21: qty 2

## 2017-09-21 MED ORDER — BACITRACIN ZINC 500 UNIT/GM EX OINT
TOPICAL_OINTMENT | CUTANEOUS | Status: AC
  Filled 2017-09-21: qty 28.35

## 2017-09-21 MED ORDER — LIDOCAINE HCL (CARDIAC) 20 MG/ML IV SOLN
INTRAVENOUS | Status: DC | PRN
  Administered 2017-09-21: 60 mg via INTRAVENOUS

## 2017-09-21 MED ORDER — ONDANSETRON HCL 4 MG/2ML IJ SOLN
INTRAMUSCULAR | Status: AC
  Filled 2017-09-21: qty 2

## 2017-09-21 MED ORDER — CELECOXIB 200 MG PO CAPS
200.0000 mg | ORAL_CAPSULE | Freq: Two times a day (BID) | ORAL | Status: DC
  Administered 2017-09-21 – 2017-09-30 (×18): 200 mg via ORAL
  Filled 2017-09-21 (×19): qty 1

## 2017-09-21 MED ORDER — CYCLOBENZAPRINE HCL 10 MG PO TABS
10.0000 mg | ORAL_TABLET | Freq: Three times a day (TID) | ORAL | Status: DC
  Administered 2017-09-21: 10 mg via ORAL

## 2017-09-21 MED ORDER — ONDANSETRON HCL 4 MG PO TABS: 4.0000 mg | ORAL_TABLET | Freq: Three times a day (TID) | ORAL | Status: DC | PRN

## 2017-09-21 MED ORDER — SODIUM CHLORIDE 0.9 % IV SOLN: 250.0000 mL | INTRAVENOUS | Status: DC

## 2017-09-21 MED ORDER — THROMBIN (RECOMBINANT) 5000 UNITS EX SOLR
CUTANEOUS | Status: AC
  Filled 2017-09-21: qty 5000

## 2017-09-21 MED ORDER — KETOROLAC TROMETHAMINE 15 MG/ML IJ SOLN
INTRAMUSCULAR | Status: AC
  Filled 2017-09-21: qty 1

## 2017-09-21 MED ORDER — VANCOMYCIN HCL 1000 MG IV SOLR
INTRAVENOUS | Status: AC
  Filled 2017-09-21: qty 1000

## 2017-09-21 MED ORDER — ONDANSETRON HCL 4 MG/2ML IJ SOLN
4.0000 mg | Freq: Four times a day (QID) | INTRAMUSCULAR | Status: DC | PRN
  Administered 2017-09-22 – 2017-09-29 (×8): 4 mg via INTRAVENOUS
  Filled 2017-09-21 (×8): qty 2

## 2017-09-21 MED ORDER — BACITRACIN ZINC 500 UNIT/GM EX OINT
TOPICAL_OINTMENT | CUTANEOUS | Status: DC | PRN
  Administered 2017-09-21: 1 via TOPICAL

## 2017-09-21 MED ORDER — ZOLPIDEM TARTRATE 5 MG PO TABS: 5.0000 mg | ORAL_TABLET | Freq: Every evening | ORAL | Status: DC | PRN

## 2017-09-21 MED ORDER — ACETAZOLAMIDE 250 MG PO TABS
250.0000 mg | ORAL_TABLET | Freq: Three times a day (TID) | ORAL | Status: DC
  Administered 2017-09-22 – 2017-09-25 (×3): 250 mg via ORAL
  Filled 2017-09-21 (×14): qty 1

## 2017-09-21 MED ORDER — WHITE PETROLATUM EX OINT
TOPICAL_OINTMENT | CUTANEOUS | Status: AC
  Administered 2017-09-21: 21:00:00
  Filled 2017-09-21: qty 28.35

## 2017-09-21 MED ORDER — PHENOL 1.4 % MT LIQD: 1.0000 | OROMUCOSAL | Status: DC | PRN

## 2017-09-21 MED ORDER — DIPHENHYDRAMINE HCL 50 MG/ML IJ SOLN
INTRAMUSCULAR | Status: AC
  Filled 2017-09-21: qty 1

## 2017-09-21 MED ORDER — ROCURONIUM BROMIDE 100 MG/10ML IV SOLN
INTRAVENOUS | Status: DC | PRN
  Administered 2017-09-21: 40 mg via INTRAVENOUS

## 2017-09-21 MED ORDER — KETOROLAC TROMETHAMINE 15 MG/ML IJ SOLN
15.0000 mg | Freq: Four times a day (QID) | INTRAMUSCULAR | Status: AC
  Administered 2017-09-21 – 2017-09-22 (×4): 15 mg via INTRAVENOUS
  Filled 2017-09-21 (×3): qty 1

## 2017-09-21 MED ORDER — ONDANSETRON HCL 4 MG/2ML IJ SOLN
INTRAMUSCULAR | Status: DC | PRN
  Administered 2017-09-21: 4 mg via INTRAVENOUS

## 2017-09-21 MED ORDER — POTASSIUM CHLORIDE IN NACL 20-0.9 MEQ/L-% IV SOLN: INTRAVENOUS | Status: DC

## 2017-09-21 MED ORDER — THROMBIN (RECOMBINANT) 5000 UNITS EX SOLR
CUTANEOUS | Status: AC
Start: 2017-09-21 — End: 2017-09-21
  Filled 2017-09-21: qty 5000

## 2017-09-21 MED ORDER — DOCUSATE SODIUM 100 MG PO CAPS
100.0000 mg | ORAL_CAPSULE | Freq: Two times a day (BID) | ORAL | Status: DC
  Administered 2017-09-21 – 2017-09-30 (×18): 100 mg via ORAL
  Filled 2017-09-21 (×18): qty 1

## 2017-09-21 MED ORDER — ACETAMINOPHEN 325 MG PO TABS
650.0000 mg | ORAL_TABLET | ORAL | Status: DC | PRN
  Administered 2017-09-21: 650 mg via ORAL
  Filled 2017-09-21: qty 2

## 2017-09-21 MED ORDER — CYCLOBENZAPRINE HCL 10 MG PO TABS
10.0000 mg | ORAL_TABLET | Freq: Three times a day (TID) | ORAL | Status: DC
  Administered 2017-09-21 – 2017-09-30 (×27): 10 mg via ORAL
  Filled 2017-09-21 (×27): qty 1

## 2017-09-21 MED ORDER — HEMOSTATIC AGENTS (NO CHARGE) OPTIME
TOPICAL | Status: DC | PRN
  Administered 2017-09-21 (×2): 1 via TOPICAL

## 2017-09-21 MED ORDER — NEOSTIGMINE METHYLSULFATE 10 MG/10ML IV SOLN
INTRAVENOUS | Status: DC | PRN
  Administered 2017-09-21: 4 mg via INTRAVENOUS

## 2017-09-21 MED ORDER — MIDAZOLAM HCL 5 MG/5ML IJ SOLN
INTRAMUSCULAR | Status: DC | PRN
  Administered 2017-09-21: 2 mg via INTRAVENOUS

## 2017-09-21 MED ORDER — ALUM & MAG HYDROXIDE-SIMETH 200-200-20 MG/5ML PO SUSP: 30.0000 mL | Freq: Four times a day (QID) | ORAL | Status: DC | PRN

## 2017-09-21 MED ORDER — MAGNESIUM CITRATE PO SOLN
1.0000 | Freq: Once | ORAL | Status: DC | PRN
  Filled 2017-09-21: qty 296

## 2017-09-21 MED ORDER — DIPHENHYDRAMINE HCL 50 MG/ML IJ SOLN
INTRAMUSCULAR | Status: DC | PRN
  Administered 2017-09-21: 25 mg via INTRAVENOUS

## 2017-09-21 MED ORDER — VALACYCLOVIR HCL 500 MG PO TABS
1000.0000 mg | ORAL_TABLET | Freq: Every day | ORAL | Status: DC | PRN
  Filled 2017-09-21: qty 2

## 2017-09-21 MED ORDER — HEPARIN SODIUM (PORCINE) 5000 UNIT/ML IJ SOLN
5000.0000 [IU] | Freq: Three times a day (TID) | INTRAMUSCULAR | Status: DC
  Administered 2017-09-22 – 2017-09-30 (×24): 5000 [IU] via SUBCUTANEOUS
  Filled 2017-09-21 (×29): qty 1

## 2017-09-21 MED ORDER — VANCOMYCIN HCL 1000 MG IV SOLR
INTRAVENOUS | Status: DC | PRN
  Administered 2017-09-21: 1000 mg via TOPICAL

## 2017-09-21 MED ORDER — 0.9 % SODIUM CHLORIDE (POUR BTL) OPTIME
TOPICAL | Status: DC | PRN
  Administered 2017-09-21 (×2): 1000 mL

## 2017-09-21 MED ORDER — ACETAMINOPHEN 500 MG PO TABS: 1000.0000 mg | ORAL_TABLET | Freq: Four times a day (QID) | ORAL | Status: DC | PRN

## 2017-09-21 MED ORDER — PANTOPRAZOLE SODIUM 40 MG PO TBEC
40.0000 mg | DELAYED_RELEASE_TABLET | Freq: Two times a day (BID) | ORAL | Status: DC
  Administered 2017-09-21 – 2017-09-30 (×18): 40 mg via ORAL
  Filled 2017-09-21 (×18): qty 1

## 2017-09-21 MED ORDER — VANCOMYCIN HCL IN DEXTROSE 1-5 GM/200ML-% IV SOLN
1000.0000 mg | Freq: Once | INTRAVENOUS | Status: AC
  Administered 2017-09-21: 1000 mg via INTRAVENOUS
  Filled 2017-09-21: qty 200

## 2017-09-21 MED ORDER — FENTANYL CITRATE (PF) 250 MCG/5ML IJ SOLN
INTRAMUSCULAR | Status: AC
  Filled 2017-09-21: qty 5

## 2017-09-21 MED ORDER — PHENYLEPHRINE HCL 10 MG/ML IJ SOLN
INTRAMUSCULAR | Status: DC | PRN
  Administered 2017-09-21: 120 ug via INTRAVENOUS
  Administered 2017-09-21: 40 ug via INTRAVENOUS
  Administered 2017-09-21 (×2): 120 ug via INTRAVENOUS
  Administered 2017-09-21: 80 ug via INTRAVENOUS
  Administered 2017-09-21 (×2): 120 ug via INTRAVENOUS

## 2017-09-21 SURGICAL SUPPLY — 47 items
BAG DECANTER FOR FLEXI CONT (MISCELLANEOUS) IMPLANT
BENZOIN TINCTURE PRP APPL 2/3 (GAUZE/BANDAGES/DRESSINGS) IMPLANT
BLADE CLIPPER SURG (BLADE) IMPLANT
CANISTER SUCT 3000ML PPV (MISCELLANEOUS) ×3 IMPLANT
CARTRIDGE OIL MAESTRO DRILL (MISCELLANEOUS) IMPLANT
CLOSURE WOUND 1/2 X4 (GAUZE/BANDAGES/DRESSINGS)
DIFFUSER DRILL AIR PNEUMATIC (MISCELLANEOUS) IMPLANT
DRAPE LAPAROTOMY 100X72 PEDS (DRAPES) ×3 IMPLANT
DRAPE LAPAROTOMY 100X72X124 (DRAPES) IMPLANT
DRAPE POUCH INSTRU U-SHP 10X18 (DRAPES) ×3 IMPLANT
DRAPE SURG 17X23 STRL (DRAPES) IMPLANT
DRSG OPSITE POSTOP 4X6 (GAUZE/BANDAGES/DRESSINGS) ×3 IMPLANT
DURASEAL APPLICATOR TIP (TIP) ×3 IMPLANT
DURASEAL SPINE SEALANT 3ML (MISCELLANEOUS) ×3 IMPLANT
ELECT REM PT RETURN 9FT ADLT (ELECTROSURGICAL) ×3
ELECTRODE REM PT RTRN 9FT ADLT (ELECTROSURGICAL) ×1 IMPLANT
GAUZE SPONGE 4X4 12PLY STRL (GAUZE/BANDAGES/DRESSINGS) ×3 IMPLANT
GAUZE SPONGE 4X4 16PLY XRAY LF (GAUZE/BANDAGES/DRESSINGS) IMPLANT
GLOVE BIOGEL PI IND STRL 6.5 (GLOVE) ×2 IMPLANT
GLOVE BIOGEL PI INDICATOR 6.5 (GLOVE) ×4
GLOVE ECLIPSE 6.5 STRL STRAW (GLOVE) IMPLANT
GLOVE EXAM NITRILE LRG STRL (GLOVE) IMPLANT
GLOVE EXAM NITRILE XL STR (GLOVE) IMPLANT
GLOVE EXAM NITRILE XS STR PU (GLOVE) IMPLANT
GLOVE SURG SS PI 6.5 STRL IVOR (GLOVE) ×9 IMPLANT
GOWN STRL REUS W/ TWL LRG LVL3 (GOWN DISPOSABLE) ×3 IMPLANT
GOWN STRL REUS W/ TWL XL LVL3 (GOWN DISPOSABLE) IMPLANT
GOWN STRL REUS W/TWL LRG LVL3 (GOWN DISPOSABLE) ×6
GOWN STRL REUS W/TWL XL LVL3 (GOWN DISPOSABLE)
KIT BASIN OR (CUSTOM PROCEDURE TRAY) ×3 IMPLANT
KIT ROOM TURNOVER OR (KITS) ×3 IMPLANT
NEEDLE HYPO 22GX1.5 SAFETY (NEEDLE) ×3 IMPLANT
NS IRRIG 1000ML POUR BTL (IV SOLUTION) IMPLANT
OIL CARTRIDGE MAESTRO DRILL (MISCELLANEOUS)
PACK LAMINECTOMY NEURO (CUSTOM PROCEDURE TRAY) ×3 IMPLANT
PAD ARMBOARD 7.5X6 YLW CONV (MISCELLANEOUS) ×9 IMPLANT
SPONGE SURGIFOAM ABS GEL SZ50 (HEMOSTASIS) ×3 IMPLANT
STRIP CLOSURE SKIN 1/2X4 (GAUZE/BANDAGES/DRESSINGS) IMPLANT
SUT ETHILON 3 0 FSL (SUTURE) ×3 IMPLANT
SUT VIC AB 1 CT1 18XBRD ANBCTR (SUTURE) ×2 IMPLANT
SUT VIC AB 1 CT1 8-18 (SUTURE) ×4
SUT VIC AB 2-0 CP2 18 (SUTURE) ×6 IMPLANT
SWAB COLLECTION DEVICE MRSA (MISCELLANEOUS) ×3 IMPLANT
SWAB CULTURE ESWAB REG 1ML (MISCELLANEOUS) ×3 IMPLANT
TOWEL GREEN STERILE (TOWEL DISPOSABLE) ×3 IMPLANT
TOWEL GREEN STERILE FF (TOWEL DISPOSABLE) ×3 IMPLANT
WATER STERILE IRR 1000ML POUR (IV SOLUTION) ×3 IMPLANT

## 2017-09-21 NOTE — H&P (Signed)
BP 124/77   Pulse (!) 110   Temp 98.2 F (36.8 C) (Oral)   Resp (!) 22   Ht 5\' 2"  (1.575 m)   Wt 92.5 kg (204 lb)   LMP 08/30/2017   SpO2 100%   BMI 37.31 kg/m  [name] presented to my office today with purulent drainage from the chiari decompression incision.  Allergies  Allergen Reactions  . Latex Hives  . Latuda [Lurasidone Hcl] Hives, Itching and Other (See Comments)    Caused throat to itch badly  . Oxycodone-Acetaminophen Hives    Pt states the reaction may have been due to her laundry detergent.  . Phenergan [Promethazine] Other (See Comments)    Caused aggression and rage  . Reglan [Metoclopramide] Other (See Comments)    Aggressive and angry  . Tape Hives  . Hydrocodone Hives and Itching   Family History  Problem Relation Age of Onset  . Diabetes Mother   . Hypertension Mother   . Heart attack Mother   . Hyperlipidemia Mother   . Pulmonary embolism Mother   . Cataracts Mother   . Glaucoma Mother   . Heart attack Brother   . Diabetes Maternal Grandmother   . Diabetes Maternal Grandfather   . Heart disease Maternal Grandfather   . Prostate cancer Paternal Grandfather   . Pancreatic cancer Paternal Grandfather   . Stomach cancer Paternal Grandfather        mets all to back and lung  . Cerebral aneurysm Paternal Aunt    Social History   Socioeconomic History  . Marital status: Single    Spouse name: Not on file  . Number of children: 5  . Years of education: Not on file  . Highest education level: GED or equivalent  Social Needs  . Financial resource strain: Not on file  . Food insecurity - worry: Not on file  . Food insecurity - inability: Not on file  . Transportation needs - medical: Not on file  . Transportation needs - non-medical: Not on file  Occupational History  . Occupation: Building surveyordaycare teacher  Tobacco Use  . Smoking status: Former Smoker    Packs/day: 0.50    Years: 10.00    Pack years: 5.00    Types: Cigarettes    Last attempt  to quit: 03/11/2016    Years since quitting: 1.5  . Smokeless tobacco: Never Used  Substance and Sexual Activity  . Alcohol use: No  . Drug use: Yes    Types: Marijuana  . Sexual activity: Not Currently    Birth control/protection: Surgical  Other Topics Concern  . Not on file  Social History Narrative   Lives at home with fiance & children   Right handed   Drinks 3 sodas daily   Past Medical History:  Diagnosis Date  . Anxiety   . Depression   . GERD (gastroesophageal reflux disease)   . Headache   . Herpes   . Hyperemesis gravidarum   . PTSD (post-traumatic stress disorder)    Past Surgical History:  Procedure Laterality Date  . MOUTH SURGERY  2005  . SUBOCCIPITAL CRANIECTOMY CERVICAL LAMINECTOMY N/A 09/09/2017   Procedure: CHIARI DECOMPRESSION;  Surgeon: Coletta Memosabbell, Briyah Wheelwright, MD;  [facility];  Service: Neurosurgery;  Laterality: N/A;  . TUBAL LIGATION  2012   Prior to Admission medications   Medication Sig Start Date End Date Taking? Authorizing Provider  acetaminophen (TYLENOL) 500 MG tablet Take 1,000 mg by mouth every 6 (six) hours as needed (for  headaches).   Yes [provider]  acetaZOLAMIDE (DIAMOX) 250 MG tablet Take 250 mg by mouth 3 (three) times daily. 09/10/17  Yes [provider]  CARAFATE 1 GM/10ML suspension Take 10 mLs (1 g total) by mouth 4 (four) times daily - with meals and at bedtime. 07/01/17  Yes [name], MD  cyclobenzaprine (FLEXERIL) 10 MG tablet Take 10 mg by mouth 3 (three) times daily. 09/16/17  Yes [provider]  ibuprofen (ADVIL,MOTRIN) 200 MG tablet Take 200 mg by mouth every 6 (six) hours as needed for fever, headache or mild pain.    Yes [provider]  ondansetron (ZOFRAN) 4 MG tablet Take 1 tablet (4 mg total) by mouth every 8 (eight) hours as needed for nausea or vomiting. 09/02/17  Yes [name], MD  oxyCODONE-acetaminophen (PERCOCET) 7.5-325 MG tablet Take 1 tablet by mouth every 8 (eight) hours  as needed for moderate pain or severe pain (Take only as needed for pain). 08/31/17 08/31/18 Yes Garnette Gunner, MD  pantoprazole (PROTONIX) 40 MG tablet Take 1 tablet (40 mg total) by mouth 2 (two) times daily. 08/09/17  Yes Rachael Fee, MD  valACYclovir (VALTREX) 1000 MG tablet Take 1 tablet (1,000 mg total) by mouth daily. Patient taking differently: Take 1,000 mg by mouth daily as needed (for flares).  07/01/17   [name], MD   Physical Exam  Constitutional: She is oriented to person, place, and time. She appears well-developed and well-nourished. She appears distressed.  Eyes: Conjunctivae and EOM are normal. Pupils are equal, round, and reactive to light.  Neck: Normal range of motion. Neck supple.  Cardiovascular: Normal rate, regular rhythm, normal heart sounds and intact distal pulses.  Pulmonary/Chest: Effort normal and breath sounds normal.  Abdominal: Soft. Bowel sounds are normal.  Musculoskeletal: Normal range of motion.  Neurological: She is alert and oriented to person, place, and time. She displays normal reflexes. No cranial nerve deficit or sensory deficit. She exhibits normal muscle tone. Coordination normal.  Skin: Skin is warm. There is erythema.  Drainage purulent from inferior portion of the incision. Erythema, tenderness  Psychiatric: She has a normal mood and affect. Her behavior is normal. Judgment and thought content normal.   Assessment/Plan Or for wound exploration,

## 2017-09-21 NOTE — Progress Notes (Signed)
Offered to order patient a meal tray; cafeteria states they can only give patient meatloaf. Patient states she does not want meatloaf or [place] meal tray.

## 2017-09-21 NOTE — Op Note (Signed)
09/21/2017  7:01 PM  PATIENT:  Angela Mosley  32 y.o. female  PRE-OPERATIVE DIAGNOSIS:  WOUND INFECTION  POST-OPERATIVE DIAGNOSIS:  WOUND INFECTION  PROCEDURE:  Procedure(s): Posterior Cervical Wound Exploration  SURGEON: Surgeon(s): Coletta Memosabbell, Shona Pardo, MD  ASSISTANTS:none  ANESTHESIA:   local and general  EBL:  No intake/output data recorded.  BLOOD ADMINISTERED:none  CELL SAVER GIVEN:none  COUNT:per nursing  DRAINS: none   SPECIMEN:  No Specimen  DICTATION: Angela Mosley was taken to the operating room, intubated, and placed under a general anesthetic without difficulty. She was positioned prone on the operating table. Her neck was prepped and draped in a sterile manner. I removed the sutures, and cultured some of the initial drainage. There was certainly some purulent drainage, but most of if appeared to be csf. I opened more of the wound to inspect the patch graft and the underlying tissue. All of the tissue had good bleeding. No necrotic areas were observed. I irrigated 2 liters of saline, and took more cultures. I then placed some dural sealant over the inferior portion of the graft. I placed vancomycin powder in the subfascial plane. I closed the nuchal ligament with vicryl sutures. I placed more vancomycin powder in the prefascial plane. I approximated the skin edges with nylon vertical mattress sutures. I applied a sterile dressing.  We then rolled her onto the strecther. She was then extubated.  PLAN OF CARE: Admit to inpatient   PATIENT DISPOSITION:  PACU - hemodynamically stable.   Delay start of Pharmacological VTE agent (>24hrs) due to surgical blood loss or risk of bleeding:  no

## 2017-09-21 NOTE — Transfer of Care (Signed)
Immediate Anesthesia Transfer of Care Note  Patient: Angela FredricksonJeronae Mosley  Procedure(s) Performed: Posterior Cervical Wound Exploration (N/A Neck)  Patient Location: PACU  Anesthesia Type:General  Level of Consciousness: awake, alert , oriented and patient cooperative  Airway & Oxygen Therapy: Patient Spontanous Breathing and Patient connected to nasal cannula oxygen  Post-op Assessment: Report given to RN and Post -op Vital signs reviewed and stable  Post vital signs: Reviewed and stable  Last Vitals:  Vitals:   09/21/17 1419 09/21/17 1845  BP: 124/77 123/67  Pulse: (!) 110 100  Resp: (!) 22 19  Temp: 36.8 C (!) 36.4 C  SpO2: 100% 100%    Last Pain:  Vitals:   09/21/17 1845  TempSrc:   PainSc: (P) 0-No pain         Complications: No apparent anesthesia complications

## 2017-09-21 NOTE — Anesthesia Preprocedure Evaluation (Signed)
Anesthesia Evaluation  Patient identified by MRN, date of birth, ID band Patient awake    Reviewed: Allergy & Precautions, NPO status , Patient's Chart, lab work & pertinent test results  Airway Mallampati: II  TM Distance: >3 FB Neck ROM: Full    Dental  (+) Missing,    Pulmonary former smoker,    Pulmonary exam normal breath sounds clear to auscultation       Cardiovascular negative cardio ROS Normal cardiovascular exam Rhythm:Regular Rate:Normal     Neuro/Psych  Headaches, PSYCHIATRIC DISORDERS Anxiety Depression Bipolar Disorder PTSD (post-traumatic stress disorder)Decreased hearing in right ear    GI/Hepatic Neg liver ROS, GERD  Medicated and Controlled,  Endo/Other  negative endocrine ROS  Renal/GU negative Renal ROS     Musculoskeletal negative musculoskeletal ROS (+)   Abdominal (+) + obese,   Peds  Hematology  (+) anemia ,   Anesthesia Other Findings CHIARI MALFORMATION TYPE I  Reproductive/Obstetrics hcg negative                             Anesthesia Physical Anesthesia Plan  ASA: II  Anesthesia Plan: General   Post-op Pain Management:    Induction: Intravenous  PONV Risk Score and Plan: 3 and Ondansetron and Dexamethasone  Airway Management Planned: Oral ETT  Additional Equipment: None  Intra-op Plan:   Post-operative Plan: Extubation in OR  Informed Consent: I have reviewed the patients History and Physical, chart, labs and discussed the procedure including the risks, benefits and alternatives for the proposed anesthesia with the patient or authorized representative who has indicated his/her understanding and acceptance.   Dental advisory given  Plan Discussed with: CRNA and Surgeon  Anesthesia Plan Comments:         Anesthesia Quick Evaluation

## 2017-09-21 NOTE — Anesthesia Postprocedure Evaluation (Signed)
Anesthesia Post Note  Patient: Angela Mosley  Procedure(s) Performed: Posterior Cervical Wound Exploration (N/A Neck)     Patient location during evaluation: PACU Anesthesia Type: General Level of consciousness: awake and alert Pain management: pain level controlled Vital Signs Assessment: post-procedure vital signs reviewed and stable Respiratory status: spontaneous breathing, nonlabored ventilation and respiratory function stable Cardiovascular status: blood pressure returned to baseline and stable Postop Assessment: no apparent nausea or vomiting Anesthetic complications: no    Last Vitals:  Vitals:   09/21/17 1953 09/21/17 2020  BP: 118/74 125/80  Pulse: 94 97  Resp: 12 18  Temp: 36.7 C 37.1 C  SpO2: 97% 99%    Last Pain:  Vitals:   09/21/17 2026  TempSrc:   PainSc: 3                  Marquel Spoto,W. EDMOND

## 2017-09-22 ENCOUNTER — Encounter (HOSPITAL_COMMUNITY): Payer: Self-pay | Admitting: Neurosurgery

## 2017-09-22 MED ORDER — VANCOMYCIN HCL IN DEXTROSE 1-5 GM/200ML-% IV SOLN
1000.0000 mg | Freq: Three times a day (TID) | INTRAVENOUS | Status: DC
  Administered 2017-09-22 – 2017-09-27 (×14): 1000 mg via INTRAVENOUS
  Filled 2017-09-22 (×17): qty 200

## 2017-09-22 NOTE — Progress Notes (Signed)
Patient ID: Angela Mosley, female   DOB: [birth year], 32 y.o.   MRN: [id number] BP 114/71 (BP Location: Right Arm)   Pulse 69   Temp 98.4 F (36.9 C) (Oral)   Resp 18   Ht 5\' 2"  (1.575 m)   Wt 92.5 kg (204 lb)   LMP 08/30/2017   SpO2 100%   BMI 37.31 kg/m  Alert and oriented x 4, speech is clear and fluent Moving all extremities well Dressing is stained with blood, in place

## 2017-09-22 NOTE — Progress Notes (Signed)
Pharmacy Antibiotic Note  Angela Mosley is a 32 y.o. female admitted on 09/21/2017 with cervical wound.  Pharmacy has been consulted for Vancomycin  dosing.  Plan: Vancomycin 1 gram iv Q 8 hours Follow cultures, Scr, progress  Height: 5\' 2"  (157.5 cm) Weight: 204 lb (92.5 kg) IBW/kg (Calculated) : 50.1  Temp (24hrs), Avg:98.2 F (36.8 C), Min:97.5 F (36.4 C), Max:98.8 F (37.1 C)  Recent Labs  Lab 09/20/17 0416  WBC 10.5  CREATININE 0.73    Estimated Creatinine Clearance: 108.9 mL/min (by C-G formula based on SCr of 0.73 mg/dL).    Allergies  Allergen Reactions  . Latex Hives  . Latuda [Lurasidone Hcl] Hives, Itching and Other (See Comments)    Caused throat to itch badly  . Oxycodone-Acetaminophen Hives    Pt states the reaction may have been due to her laundry detergent.  . Phenergan [Promethazine] Other (See Comments)    Caused aggression and rage  . Reglan [Metoclopramide] Other (See Comments)    Aggressive and angry  . Tape Hives  . Hydrocodone Hives and Itching    Thank you Okey RegalLisa Tyanne Derocher, PharmD [phone] 09/22/2017 12:56 PM

## 2017-09-22 NOTE — Evaluation (Signed)
Physical Therapy Evaluation Patient Details Name: Angela Mosley MRN: [id number] DOB: 09/04/1985 Today's Date: 09/22/2017   History of Present Illness  Pt is a 32 y/o female admitted secondary to purulent drainage from the chiari decompression incision. Pt is s/p posterior cervical wound exploration on 2/26. Pt had initial surgery (suboccipital craniectomy for posterior fossa decompression, dural patch graft, C1 laminectomy) on 2/15. PMH including but not limited to depression.    Clinical Impression  Pt presented supine in bed with HOB elevated, awake and willing to participate in therapy session. Prior to admission, pt reported that since her initial surgery on 2/15 she has required assistance with ADLs and with ambulation/stairs. Pt reported that she "gets winded" very quickly and requires frequent rest breaks. Pt stated that she is unable to ascend/descend flight of stairs without stopping for a break. Pt currently performing bed mobility with modified independence, transfers with supervision, ambulation in hallway with min guard for safety without use of an AD but frequently reaching for hand rails in hallway. Of note, pt also required frequent standing rest breaks secondary to fatigue. Pt on RA throughout with SPO2 decreasing briefly to as low as 83% with ambulation. Pt's SPO2 recovered quickly to >90% with standing rest break and focus on deep breathing. Pt would continue to benefit from skilled physical therapy services at this time while admitted and after d/c to address the below listed limitations in order to improve overall safety and independence with functional mobility.     Follow Up Recommendations Outpatient PT;Supervision/Assistance - 24 hour    Equipment Recommendations  None recommended by PT    Recommendations for Other Services OT consult     Precautions / Restrictions Precautions Precautions: None Restrictions Weight Bearing Restrictions: No      Mobility  Bed  Mobility Overal bed mobility: Modified Independent                Transfers Overall transfer level: Needs assistance Equipment used: None Transfers: Sit to/from Stand Sit to Stand: Supervision         General transfer comment: supervision for safety; pt with +dizziness upon standing initially  Ambulation/Gait Ambulation/Gait assistance: Min guard Ambulation Distance (Feet): 200 Feet Assistive device: None(frequently reaching for hand rail in hallway) Gait Pattern/deviations: Step-through pattern;Decreased stride length Gait velocity: decreased Gait velocity interpretation: Below normal speed for age/gender General Gait Details: slow, cautious gait; pt required frequent standing rest breaks secondary to fatigue  Stairs            Wheelchair Mobility    Modified Rankin (Stroke Patients Only)       Balance Overall balance assessment: Needs assistance Sitting-balance support: Feet supported Sitting balance-Leahy Scale: Good     Standing balance support: During functional activity;No upper extremity supported Standing balance-Leahy Scale: Fair                               Pertinent Vitals/Pain Pain Assessment: Faces Faces Pain Scale: Hurts little more Pain Location: posterior neck and head Pain Descriptors / Indicators: Aching Pain Intervention(s): Monitored during session;Repositioned    Home Living Family/patient expects to be discharged to:: Private residence Living Arrangements: Spouse/significant other;Parent;Children Available Help at Discharge: Family;Available 24 hours/day;Other (Comment)(family that is available are children and mother) Type of Home: House Home Access: Stairs to enter Entrance Stairs-Rails: None Entrance Stairs-Number of Steps: 1 Home Layout: Two level Home Equipment: Shower seat;Bedside commode      Prior Function Level  of Independence: Needs assistance   Gait / Transfers Assistance Needed: Since initial  surgery on 2/14, pt requires assistance for safety with ambulation, not using an AD  ADL's / Homemaking Assistance Needed: requires assistance with bathing and dressing her upper body and back side. pt is able to wash lower body independently         Hand Dominance   Dominant Hand: Right    Extremity/Trunk Assessment   Upper Extremity Assessment Upper Extremity Assessment: Overall WFL for tasks assessed    Lower Extremity Assessment Lower Extremity Assessment: Overall WFL for tasks assessed    Cervical / Trunk Assessment Cervical / Trunk Assessment: Other exceptions Cervical / Trunk Exceptions: s/p upper cervical surgery  Communication   Communication: No difficulties  Cognition Arousal/Alertness: Awake/alert Behavior During Therapy: WFL for tasks assessed/performed Overall Cognitive Status: Within Functional Limits for tasks assessed                                        General Comments General comments (skin integrity, edema, etc.): Pt fatigues quickly and requires frequent standing rest breaks. Of note, pt on RA throughout with SPO2 decreasing to as low as 83% with ambulation, with very quick recovery to >90% with standing rest break and focus on deep breathing.    Exercises     Assessment/Plan    PT Assessment Patient needs continued PT services  PT Problem List Decreased activity tolerance;Decreased balance;Decreased mobility;Decreased coordination;Decreased knowledge of use of DME;Decreased safety awareness;Decreased knowledge of precautions;Cardiopulmonary status limiting activity       PT Treatment Interventions DME instruction;Gait training;Stair training;Functional mobility training;Therapeutic activities;Balance training;Neuromuscular re-education;Therapeutic exercise;Patient/family education    PT Goals (Current goals can be found in the Care Plan section)  Acute Rehab PT Goals Patient Stated Goal: return to PLOF PT Goal Formulation:  With patient Time For Goal Achievement: 10/06/17 Potential to Achieve Goals: Good    Frequency Min 3X/week   Barriers to discharge        Co-evaluation               AM-PAC PT "6 Clicks" Daily Activity  Outcome Measure Difficulty turning over in bed (including adjusting bedclothes, sheets and blankets)?: None Difficulty moving from lying on back to sitting on the side of the bed? : None Difficulty sitting down on and standing up from a chair with arms (e.g., wheelchair, bedside commode, etc,.)?: A Little Help needed moving to and from a bed to chair (including a wheelchair)?: None Help needed walking in hospital room?: A Little Help needed climbing 3-5 steps with a railing? : A Little 6 Click Score: 21    End of Session Equipment Utilized During Treatment: Gait belt Activity Tolerance: Patient tolerated treatment well Patient left: in bed;with call bell/phone within reach;with SCD's reapplied Nurse Communication: Mobility status PT Visit Diagnosis: Other abnormalities of gait and mobility (R26.89)    Time: [id number] PT Time Calculation (min) (ACUTE ONLY): 27 min   Charges:   PT Evaluation $PT Eval Moderate Complexity: 1 Mod PT Treatments $Gait Training: 8-22 mins   PT G Codes:        MellottJennifer Donjuan Robison, PT, DPT [phone]   Alessandra BevelsJennifer M Love Milbourne 09/22/2017, 9:52 AM

## 2017-09-23 ENCOUNTER — Other Ambulatory Visit: Payer: Self-pay

## 2017-09-23 MED ORDER — MAGNESIUM CITRATE PO SOLN
1.0000 | Freq: Once | ORAL | Status: AC
  Administered 2017-09-23: 1 via ORAL

## 2017-09-23 NOTE — Plan of Care (Signed)
  Completed/Met Activity: Will remain free from falls 09/23/2017 2303 - Completed/Met by Charlena Cross, RN Physical Regulation: Postoperative complications will be avoided or minimized 09/23/2017 2303 - Completed/Met by Charlena Cross, RN

## 2017-09-23 NOTE — Progress Notes (Signed)
Patient ID: Angela Mosley, female   DOB: 1986/07/26, 32 y.o.   MRN: [id number] BP 109/73   Pulse 96   Temp 98.4 F (36.9 C)   Resp 16   Ht 5\' 2"  (1.575 m)   Wt 92.5 kg (204 lb)   LMP 08/30/2017   SpO2 100%   BMI 37.31 kg/m  Alert and oriented x 4, speech is clear and fluent Moving all extremities well Complaining of weakness in the left lower extremity Feeling better. Wound dressing changed. It is dry

## 2017-09-23 NOTE — Evaluation (Signed)
Occupational Therapy Evaluation Patient Details Name: Angela Mosley MRN: [id number] DOB: March 03, 1986 Today's Date: 09/23/2017    History of Present Illness Pt is a 32 y/o female admitted secondary to purulent drainage from the chiari decompression incision. Pt is s/p posterior cervical wound exploration on 2/26. Pt had initial surgery (suboccipital craniectomy for posterior fossa decompression, dural patch graft, C1 laminectomy) on 2/15. PMH including but not limited to depression.   Clinical Impression   Pt reports she required light min assist with ADL PTA. Currently pt supervision overall with ADL and functional mobility. Pt planning to d/c home with supervision from family. No further acute OT needs identified; signing off at this time. Please re-consult if needs change. Thank you for this referral.    Follow Up Recommendations  No OT follow up;Supervision - Intermittent    Equipment Recommendations  None recommended by OT    Recommendations for Other Services       Precautions / Restrictions Precautions Precautions: None Restrictions Weight Bearing Restrictions: No      Mobility Bed Mobility Overal bed mobility: Modified Independent                Transfers Overall transfer level: Needs assistance Equipment used: None Transfers: Sit to/from Stand Sit to Stand: Supervision         General transfer comment: for safety, increased time pt reports hx of dizziness    Balance Overall balance assessment: Needs assistance Sitting-balance support: Feet supported Sitting balance-Leahy Scale: Good     Standing balance support: No upper extremity supported;During functional activity Standing balance-Leahy Scale: Good                             ADL either performed or assessed with clinical judgement   ADL Overall ADL's : Needs assistance/impaired Eating/Feeding: Independent;Sitting   Grooming: Supervision/safety;Standing   Upper Body  Bathing: Set up;Sitting   Lower Body Bathing: Supervison/ safety;Sit to/from stand   Upper Body Dressing : Set up;Sitting   Lower Body Dressing: Supervision/safety;Sit to/from stand   Toilet Transfer: Supervision/safety;Ambulation;BSC       Tub/ Shower Transfer: Tub transfer;Supervision/safety;Ambulation Tub/Shower Transfer Details (indicate cue type and reason): Simulated in room Functional mobility during ADLs: Supervision/safety General ADL Comments: Pt reporting difficulty with bathing since they do not want her to raise her arms up above head--provided her with long handled sponge     Vision         Perception     Praxis      Pertinent Vitals/Pain Pain Assessment: Faces Faces Pain Scale: Hurts little more Pain Location: neck Pain Descriptors / Indicators: Sore Pain Intervention(s): Monitored during session     Hand Dominance Right   Extremity/Trunk Assessment Upper Extremity Assessment Upper Extremity Assessment: Overall WFL for tasks assessed   Lower Extremity Assessment Lower Extremity Assessment: Defer to PT evaluation   Cervical / Trunk Assessment Cervical / Trunk Assessment: Other exceptions Cervical / Trunk Exceptions: s/p upper cervical surgery   Communication Communication Communication: No difficulties   Cognition Arousal/Alertness: Awake/alert Behavior During Therapy: WFL for tasks assessed/performed Overall Cognitive Status: Within Functional Limits for tasks assessed                                     General Comments       Exercises     Shoulder Instructions      Home  Living Family/patient expects to be discharged to:: Private residence Living Arrangements: Spouse/significant other;Parent;Children Available Help at Discharge: Family;Available 24 hours/day;Other (Comment) Type of Home: House Home Access: Stairs to enter Enter[employer] CorporationEntrance Stairs-Number of Steps: 1 Entrance Stairs-Rails: None Home Layout: Two level;Bed/bath  upstairs     Bathroom Shower/Tub: Tub/shower unit;Curtain   FirefighterBathroom Toilet: Standard     Home Equipment: Shower seat;Bedside commode;Grab bars - tub/shower          Prior Functioning/Environment Level of Independence: Needs assistance  Gait / Transfers Assistance Needed: Since initial surgery on 2/14, pt requires assistance for safety with ambulation, not using an AD ADL's / Homemaking Assistance Needed: requires assistance with bathing and dressing her upper body and back side. pt is able to wash lower body independently             OT Problem List:        OT Treatment/Interventions:      OT Goals(Current goals can be found in the care plan section) Acute Rehab OT Goals Patient Stated Goal: return to home  OT Goal Formulation: All assessment and education complete, DC therapy  OT Frequency:     Barriers to D/C:            Co-evaluation              AM-PAC PT "6 Clicks" Daily Activity     Outcome Measure Help from another person eating meals?: None Help from another person taking care of personal grooming?: A Little Help from another person toileting, which includes using toliet, bedpan, or urinal?: A Little Help from another person bathing (including washing, rinsing, drying)?: A Little Help from another person to put on and taking off regular upper body clothing?: None Help from another person to put on and taking off regular lower body clothing?: A Little 6 Click Score: 20   End of Session Nurse Communication: Mobility status;Other (comment)(no equipment or f/u needs)  Activity Tolerance: Patient tolerated treatment well Patient left: in chair;with call bell/phone within reach  OT Visit Diagnosis: Unsteadiness on feet (R26.81)                Time: [id number] OT Time Calculation (min): 15 min Charges:  OT General Charges $OT Visit: 1 Visit OT Evaluation $OT Eval Low Complexity: 1 Low G-Codes:     Karaline Buresh A. Brett Albinooffey, M.S., OTR/L Pager:  [phone]  Gaye AlkenBailey A Drae Mitzel 09/23/2017, 5:13 PM

## 2017-09-23 NOTE — Progress Notes (Signed)
Physical Therapy Treatment Patient Details Name: Angela Mosley MRN: [id number] DOB: [birth year] Today's Date: 09/23/2017    History of Present Illness Pt is a 32 y/o female admitted secondary to purulent drainage from the chiari decompression incision. Pt is s/p posterior cervical wound exploration on 2/26. Pt had initial surgery (suboccipital craniectomy for posterior fossa decompression, dural patch graft, C1 laminectomy) on 2/15. PMH including but not limited to depression.    PT Comments    Pt progressing towards physical therapy goals. Pt did request PT evaluate LLE as she reports weakness and sensation changes since her original surgery on 2/15. MMT revealed gross strength of 4-/5 in quads, hamstrings, and hip flexors with decreased dorsiflexion noted as well. Pt reports feeling of cold in L foot/ankle, and with light touch testing reports areas of hypersensitivity (tingling shooting up the leg) but grossly decreased light touch in foot, lower leg, and anterior to medial thigh. RN notified for communication with MD as pt reports she has NOT mentioned this to Dr. Franky Machoabbell since her surgery. States that these changes have been immediate since surgery and unchanged over the past couple of weeks. Will continue to follow.   Follow Up Recommendations  Outpatient PT;Supervision/Assistance - 24 hour     Equipment Recommendations  None recommended by PT    Recommendations for Other Services OT consult     Precautions / Restrictions Precautions Precautions: None Restrictions Weight Bearing Restrictions: No    Mobility  Bed Mobility               General bed mobility comments: Patient sitting upright in recliner upon treatment arrival.  Transfers Overall transfer level: Needs assistance Equipment used: None Transfers: Sit to/from Stand Sit to Stand: Supervision         General transfer comment: Patient able to boost up from recliner with use of UE on handrails. Slight  dizziness reported upon standing   Ambulation/Gait Ambulation/Gait assistance: Supervision Ambulation Distance (Feet): 150 Feet Assistive device: None Gait Pattern/deviations: Step-through pattern;Decreased stride length Gait velocity: decreased Gait velocity interpretation: Below normal speed for age/gender General Gait Details: Slow and steady gait pattern throughout. Patient kept saying "i get dizzy and need rest breaks to catch my breath". VC required for maintenance of upright posture, pursed-lip breathing, and cervical precautions.   Stairs Stairs: Yes   Stair Management: One rail Right;Step to pattern;Forwards Number of Stairs: 10 General stair comments: Patient educated on use of leg advancement and UE support with handrailing. Min guard for patient safety and therapist hand was used for patient support with their hand not on railing.  Deep breathing techniques were educated with patient to inhale through nose and breathe out through mouth.  Wheelchair Mobility    Modified Rankin (Stroke Patients Only)       Balance Overall balance assessment: Needs assistance Sitting-balance support: Feet supported Sitting balance-Leahy Scale: Good     Standing balance support: During functional activity;No upper extremity supported Standing balance-Leahy Scale: Good Standing balance comment: Patient able to stand without any postural deviations.                             Cognition Arousal/Alertness: Awake/alert Behavior During Therapy: WFL for tasks assessed/performed Overall Cognitive Status: Within Functional Limits for tasks assessed  Exercises      General Comments General comments (skin integrity, edema, etc.): Patient is very cautious with rest breaks and the need to catch her breath. VC for pursed-lip breathing were implemented throughout treatment.       Pertinent Vitals/Pain Pain Assessment:  Faces Faces Pain Scale: Hurts little more Pain Location: posterior neck and head Pain Descriptors / Indicators: Aching Pain Intervention(s): Monitored during session    Home Living Family/patient expects to be discharged to:: Private residence Living Arrangements: Spouse/significant other;Parent;Children                  Prior Function            PT Goals (current goals can now be found in the care plan section) Acute Rehab PT Goals Patient Stated Goal: return to home  PT Goal Formulation: With patient Time For Goal Achievement: 10/06/17 Potential to Achieve Goals: Good Progress towards PT goals: Progressing toward goals    Frequency    Min 3X/week      PT Plan Current plan remains appropriate;Frequency needs to be updated    Co-evaluation              AM-PAC PT "6 Clicks" Daily Activity  Outcome Measure  Difficulty turning over in bed (including adjusting bedclothes, sheets and blankets)?: None Difficulty moving from lying on back to sitting on the side of the bed? : None Difficulty sitting down on and standing up from a chair with arms (e.g., wheelchair, bedside commode, etc,.)?: A Little Help needed moving to and from a bed to chair (including a wheelchair)?: None Help needed walking in hospital room?: None Help needed climbing 3-5 steps with a railing? : A Little 6 Click Score: 22    End of Session Equipment Utilized During Treatment: Gait belt Activity Tolerance: Patient limited by fatigue Patient left: in bed;with call bell/phone within reach Nurse Communication: Mobility status PT Visit Diagnosis: Other abnormalities of gait and mobility (R26.89)     Time: 1610-9604 PT Time Calculation (min) (ACUTE ONLY): 26 min  Charges:  $Gait Training: 23-37 mins                    G Codes:       Conni Slipper, PT, DPT Acute Rehabilitation Services Pager: [phone]    Marylynn Pearson 09/23/2017, 10:06 AM

## 2017-09-24 DIAGNOSIS — T8149XA Infection following a procedure, other surgical site, initial encounter: Secondary | ICD-10-CM

## 2017-09-24 DIAGNOSIS — E669 Obesity, unspecified: Secondary | ICD-10-CM

## 2017-09-24 DIAGNOSIS — B957 Other staphylococcus as the cause of diseases classified elsewhere: Secondary | ICD-10-CM

## 2017-09-24 DIAGNOSIS — Z91040 Latex allergy status: Secondary | ICD-10-CM

## 2017-09-24 DIAGNOSIS — H9311 Tinnitus, right ear: Secondary | ICD-10-CM

## 2017-09-24 DIAGNOSIS — Z87891 Personal history of nicotine dependence: Secondary | ICD-10-CM

## 2017-09-24 DIAGNOSIS — Z885 Allergy status to narcotic agent status: Secondary | ICD-10-CM

## 2017-09-24 DIAGNOSIS — G932 Benign intracranial hypertension: Secondary | ICD-10-CM

## 2017-09-24 DIAGNOSIS — F319 Bipolar disorder, unspecified: Secondary | ICD-10-CM

## 2017-09-24 DIAGNOSIS — F4310 Post-traumatic stress disorder, unspecified: Secondary | ICD-10-CM

## 2017-09-24 DIAGNOSIS — K219 Gastro-esophageal reflux disease without esophagitis: Secondary | ICD-10-CM

## 2017-09-24 DIAGNOSIS — Z91048 Other nonmedicinal substance allergy status: Secondary | ICD-10-CM

## 2017-09-24 DIAGNOSIS — Z888 Allergy status to other drugs, medicaments and biological substances status: Secondary | ICD-10-CM

## 2017-09-24 LAB — BASIC METABOLIC PANEL
ANION GAP: 8 (ref 5–15)
BUN: 7 mg/dL (ref 6–20)
CHLORIDE: 104 mmol/L (ref 101–111)
CO2: 25 mmol/L (ref 22–32)
Calcium: 8.8 mg/dL — ABNORMAL LOW (ref 8.9–10.3)
Creatinine, Ser: 0.76 mg/dL (ref 0.44–1.00)
GFR calc Af Amer: >60 mL/min (ref >60)
GLUCOSE: 101 mg/dL — AB (ref 65–99)
Potassium: 3.7 mmol/L (ref 3.5–5.1)
Sodium: 137 mmol/L (ref 135–145)

## 2017-09-24 LAB — VANCOMYCIN, TROUGH: VANCOMYCIN TR: 15 ug/mL (ref 15–20)

## 2017-09-24 NOTE — Progress Notes (Signed)
Patient ID: Hilarie FredricksonJeronae Mosley, female   DOB: [birth year], 32 y.o.   MRN: [id number] Consistent drainage from wound. Will take back tomorrow and place lumbar drain. Along with addressing the incision.  Alert and oriented x 4, speech is clear, and fluent Moving all extremities well

## 2017-09-24 NOTE — Consult Note (Signed)
[facility]    Date of Admission:  09/21/2017     Total days of antibiotics 4  Vancomycin    Reason for Consult: infection of chiari malformation repair    Referring Provider: Dr. Franky Mosley    Assessment: 32 y.o. F who presented to Dr. Sueanne Mosley office after 3 days of purulent drainage to the posterior neck surgical incision site after recently undergoing Chiari malformation decompression/duraplasty. She is now status post I&D of her site. Cultures showing coagulase negative staph infection with sensi's pending. She is maintained on vancomycin presently and seems to be tolerating it well.   Plan: 1. Would continue vancomycin until ready for discharge per neurosurgery's perspective and transition to linezolid 600 mg BID for total of 14 days of therapy. This provides just as good bioavailability as IV preparation and excellent option for coagulase negative staph species as well as other skin flora that may or may not be contributing (was on bactrim PO prior to recent admission).  2. Will have her follow up with me in 2 weeks in ID clinic to ensure she is doing well and without side effects to therapy.    Active Problems:   Wound infection after surgery   . acetaZOLAMIDE  250 mg Oral TID  . celecoxib  200 mg Oral Q12H  . cyclobenzaprine  10 mg Oral TID  . docusate sodium  100 mg Oral BID  . gabapentin  300 mg Oral TID  . heparin injection (subcutaneous)  5,000 Units Subcutaneous Q8H  . pantoprazole  40 mg Oral BID  . sucralfate  1 g Oral TID WC & HS    HPI: Angela Mosley is a 32 y.o. female with history of idiopathic intracranial hypertension, Chiari malformation, PTSD, obesity, substance use d/o, bipolar d/o and GERD. She was seen by Dr. Franky Mosley for worsening/severe headaches for about 4 weeks. She has been having tinnitus of the right ear for 2 years and was following with Dr. Lucia Mosley outpatient with Angela Mosley. 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 She was taken for chiari decompression and duraplasty. On 2/26 in follow up with Dr. Franky Mosley she reported purulent draiange from the decompression incision. Later that evening she was taken to OR for re-exploration of the wound for infection.   Op Note: initially purulent drainage noted but most drainage appeared to be c/w CSF. The wound was opened to the patch graft and underlying tissues which had good bleeding and no evidence of necrotic tissue. Cultures were obtained and the site was irrigated with 2L saline and vancomycin powder placement prior to wound closure.   Prior to coming to the hospital Angela Mosley reported no chills, fevers or night sweats. She only had frequent drainage from her posterior dressing which developed some pus on Saturday 2/23. She was taking bactrim prior to admission She is concerned about the drainage she is having presently and wondering when she will go home.   Review of Systems: Review of Systems  Constitutional: Negative for chills and fever.  HENT: Negative for tinnitus.   Eyes: Negative for blurred vision and photophobia.  Respiratory: Negative for cough and sputum production.   Cardiovascular: Negative for chest pain.  Gastrointestinal: Negative for diarrhea, nausea and vomiting.  Genitourinary: Negative for dysuria.  Skin: Negative for rash.  Neurological: Positive for headaches.    Past Medical History:  Diagnosis Date  . Anxiety   . Depression   . GERD (gastroesophageal reflux disease)   . Headache   . Herpes   . Hyperemesis gravidarum   . PTSD (post-traumatic stress disorder)     Social History   Tobacco Use  . Smoking status: Former Smoker    Packs/day: 0.50    Years: 10.00    Pack years: 5.00    Types: Cigarettes    Last attempt to quit: 03/11/2016      Years since quitting: 1.5  . Smokeless tobacco: Never Used  Substance Use Topics  . Alcohol use: No  . Drug use: Yes    Types: Marijuana    Family History  Problem Relation Age of Onset  . Diabetes Mother   . Hypertension Mother   . Heart attack Mother   . Hyperlipidemia Mother   . Pulmonary embolism Mother   . Cataracts Mother   . Glaucoma Mother   . Heart attack Brother   . Diabetes Maternal Grandmother   . Diabetes Maternal Grandfather   . Heart disease Maternal Grandfather   . Prostate cancer Paternal Grandfather   . Pancreatic cancer Paternal Grandfather   . Stomach cancer Paternal Grandfather        mets all to back and lung  . Cerebral aneurysm Paternal Aunt    Allergies  Allergen Reactions  . Latex Hives  . Latuda [Lurasidone Hcl] Hives, Itching and Other (See Comments)    Caused throat to itch badly  . Oxycodone-Acetaminophen Hives    Pt states the reaction may have been due to her laundry detergent.  . Phenergan [Promethazine] Other (See Comments)    Caused aggression and rage  . Reglan [Metoclopramide] Other (See Comments)    Aggressive and angry  . Tape Hives  . Hydrocodone Hives and Itching    OBJECTIVE: Blood pressure 127/73, pulse (!) 116, temperature 98.6 F (37 C), resp. rate 16, height 5\' 2"  (1.575 m), weight 204 lb (92.5 kg), last menstrual period 08/30/2017, SpO2 100 %.  Physical Exam  Constitutional: She is oriented to person, place, and time and well-developed, well-nourished, and in no distress.  HENT:  Mouth/Throat: No oral lesions. No dental abscesses.  Eyes: Pupils are equal, round, and reactive to light. No scleral icterus.  Neck:  Longitudinal incision to posterior neck with some surrounding edema. Some serosanguinous drainage with haloing effect. No purulence.   Cardiovascular: Normal rate, regular rhythm and normal heart sounds.  Pulmonary/Chest: Effort normal and breath sounds normal.  Abdominal: Soft. She exhibits no  distension. There is no tenderness.  Musculoskeletal: Normal range of motion. She exhibits no tenderness.  Lymphadenopathy:    She has no cervical adenopathy.  Neurological: She is alert and oriented to person, place, and time.  Skin: Skin is warm and dry. No rash noted.  Psychiatric: Mood, affect and judgment normal.  Vitals reviewed.   Lab Results Lab Results  Component Value Date   WBC 10.5 09/20/2017   HGB 13.1 09/20/2017   HCT 40.6 09/20/2017   MCV 94.9 09/20/2017   PLT 342 09/20/2017    Lab Results  Component Value Date   CREATININE 0.76 09/24/2017   BUN 7 09/24/2017   NA 137 09/24/2017   K 3.7 09/24/2017   CL 104 09/24/2017   CO2 25 09/24/2017    Lab Results  Component Value Date   ALT 16 06/13/2016   AST 22 06/13/2016  ALKPHOS 47 06/13/2016   BILITOT 0.8 06/13/2016     Microbiology: Recent Results (from the past 240 hour(s))  Aerobic/Anaerobic Culture (surgical/deep wound)     Status: None (Preliminary result)   Collection Time: 09/21/17  6:04 PM  Result Value Ref Range Status   Specimen Description WOUND  Final   Special Requests POSTERIOR CERVICAL  Final   Gram Stain   Final    ABUNDANT WBC PRESENT, PREDOMINANTLY PMN NO ORGANISMS SEEN Gram Stain Report Called to,Read Back By and Verified With: T CITTY RN [phone] 09/21/17 A BROWNING Performed at [facility], [address] [place], [place] [address]    Culture   Final    RARE STAPHYLOCOCCUS SPECIES (COAGULASE NEGATIVE) NO ANAEROBES ISOLATED; CULTURE IN PROGRESS FOR 5 DAYS    Report Status PENDING  Incomplete    Rexene AlbertsStephanie Dixon, MSN, NP-C [facility] Cell: [phone] Pager: [phone]  09/24/2017 3:14 PM

## 2017-09-24 NOTE — Progress Notes (Signed)
Patient is transferred from room 3C05 to unit 3W37 at this time. Alert and in stable condition. Report given to receiving nurse Hin, RN with all questions answered. Left unit via wheelchair with fiance and all belongings at side.

## 2017-09-24 NOTE — Progress Notes (Signed)
Physical Therapy Treatment Patient Details Name: Angela Mosley MRN: [id number] DOB: 1985-08-11 Today's Date: 09/24/2017    History of Present Illness Pt is a 32 y/o female admitted secondary to purulent drainage from the chiari decompression incision. Pt is s/p posterior cervical wound exploration on 2/26. Pt had initial surgery (suboccipital craniectomy for posterior fossa decompression, dural patch graft, C1 laminectomy) on 2/15. PMH including but not limited to depression.    PT Comments    Pt making good progress with functional mobility and tolerated ambulating a further distance this session. She continues to fatigue quickly with ambulation; however, SPO2 maintained >95% this session. Pt continues to report L LE tingling sensation with WB'ing and ambulation. Pt would continue to benefit from skilled physical therapy services at this time while admitted and after d/c to address the below listed limitations in order to improve overall safety and independence with functional mobility.    Follow Up Recommendations  Outpatient PT;Supervision/Assistance - 24 hour     Equipment Recommendations  None recommended by PT    Recommendations for Other Services       Precautions / Restrictions Precautions Precautions: None Restrictions Weight Bearing Restrictions: No    Mobility  Bed Mobility Overal bed mobility: Modified Independent                Transfers Overall transfer level: Needs assistance Equipment used: None Transfers: Sit to/from Stand Sit to Stand: Supervision         General transfer comment: for safety  Ambulation/Gait Ambulation/Gait assistance: Supervision Ambulation Distance (Feet): 200 Feet Assistive device: None Gait Pattern/deviations: Step-through pattern;Decreased stride length Gait velocity: decreased Gait velocity interpretation: Below normal speed for age/gender General Gait Details: slow, cautious, steady gait pattern without use of an  AD; pt required standing rest breaks x4 secondary to feeling "lightheaded". Pt's SPO2 maintaining in high 90's this session throughout   Stairs            Wheelchair Mobility    Modified Rankin (Stroke Patients Only)       Balance Overall balance assessment: Needs assistance Sitting-balance support: Feet supported Sitting balance-Leahy Scale: Good     Standing balance support: No upper extremity supported;During functional activity Standing balance-Leahy Scale: Good                              Cognition Arousal/Alertness: Awake/alert Behavior During Therapy: WFL for tasks assessed/performed Overall Cognitive Status: Within Functional Limits for tasks assessed                                        Exercises      General Comments        Pertinent Vitals/Pain Pain Assessment: Faces Faces Pain Scale: Hurts a little bit Pain Location: neck Pain Descriptors / Indicators: Sore Pain Intervention(s): Monitored during session;Repositioned    Home Living                      Prior Function            PT Goals (current goals can now be found in the care plan section) Acute Rehab PT Goals PT Goal Formulation: With patient Time For Goal Achievement: 10/06/17 Potential to Achieve Goals: Good Progress towards PT goals: Progressing toward goals    Frequency    Min 3X/week  PT Plan Current plan remains appropriate    Co-evaluation              AM-PAC PT "6 Clicks" Daily Activity  Outcome Measure  Difficulty turning over in bed (including adjusting bedclothes, sheets and blankets)?: None Difficulty moving from lying on back to sitting on the side of the bed? : None Difficulty sitting down on and standing up from a chair with arms (e.g., wheelchair, bedside commode, etc,.)?: None Help needed moving to and from a bed to chair (including a wheelchair)?: None Help needed walking in hospital room?: None Help  needed climbing 3-5 steps with a railing? : A Little 6 Click Score: 23    End of Session   Activity Tolerance: Patient limited by fatigue Patient left: in chair;with call bell/phone within reach Nurse Communication: Mobility status PT Visit Diagnosis: Other abnormalities of gait and mobility (R26.89)     Time: 1610-9604 PT Time Calculation (min) (ACUTE ONLY): 21 min  Charges:  $Gait Training: 8-22 mins                    G Codes:       [place], [place], [place] [phone]    Angela Mosley Angela Mosley 09/24/2017, 8:49 AM

## 2017-09-24 NOTE — Progress Notes (Signed)
Arrived from 4N at 2010.  Alert and oriented. Dressing CDI, not saturated. Call light within reach.

## 2017-09-24 NOTE — Progress Notes (Signed)
Pharmacy Antibiotic Note  Angela Mosley is a 32 y.o. female admitted on 09/21/2017 with cervical wound.  Pharmacy has been consulted for Vancomycin  Dosing.  The patient's renal function remains stable, SCr 0.76, CrCl~100 ml/min. Culture is growing coag neg staph - pending susceptibilities. A Vancomycin trough this afternoon resulted as therapeutic (VT 15 mcg/ml). Will continue current dose.   Plan: 1. Continue Vancomycin 1g IV every 8 hours 2. Will continue to follow renal function, culture results, LOT, and antibiotic de-escalation plans   Height: 5\' 2"  (157.5 cm) Weight: 204 lb (92.5 kg) IBW/kg (Calculated) : 50.1  Temp (24hrs), Avg:98.4 F (36.9 C), Min:98.3 F (36.8 C), Max:98.8 F (37.1 C)  Recent Labs  Lab 09/20/17 0416 09/24/17 0529  WBC 10.5  --   CREATININE 0.73 0.76    Estimated Creatinine Clearance: 108.9 mL/min (by C-G formula based on SCr of 0.76 mg/dL).    Allergies  Allergen Reactions  . Latex Hives  . Latuda [Lurasidone Hcl] Hives, Itching and Other (See Comments)    Caused throat to itch badly  . Oxycodone-Acetaminophen Hives    Pt states the reaction may have been due to her laundry detergent.  . Phenergan [Promethazine] Other (See Comments)    Caused aggression and rage  . Reglan [Metoclopramide] Other (See Comments)    Aggressive and angry  . Tape Hives  . Hydrocodone Hives and Itching    Vancomycin  2/27>  2/26 wound cx > rare coag neg staph - pending susceptibilities  Thank you for allowing pharmacy to be a part of this patient's care.  Georgina PillionElizabeth Seth Higginbotham, PharmD, BCPS Clinical Pharmacist Pager: [phone] Clinical phone for 09/24/2017 from 7a-3:30p: [phone] If after 3:30p, please call main pharmacy at: [phone] 09/24/2017 8:33 AM

## 2017-09-25 ENCOUNTER — Encounter (HOSPITAL_COMMUNITY)
Admission: AD | Disposition: A | Discharge status: Home / Self Care | Payer: Self-pay | Source: Ambulatory Visit | Attending: Neurosurgery

## 2017-09-25 ENCOUNTER — Inpatient Hospital Stay (HOSPITAL_COMMUNITY): Payer: Medicaid Other | Admitting: Anesthesiology

## 2017-09-25 HISTORY — PX: CRANIECTOMY: SHX331

## 2017-09-25 SURGERY — CRANIECTOMY POSTERIOR FOSSA DECOMPRESSION
Anesthesia: General | Site: Head

## 2017-09-25 MED ORDER — THROMBIN (RECOMBINANT) 20000 UNITS EX SOLR
CUTANEOUS | Status: DC | PRN
  Administered 2017-09-25: 20 mL via TOPICAL

## 2017-09-25 MED ORDER — MORPHINE SULFATE (PF) 4 MG/ML IV SOLN
1.0000 mg | INTRAVENOUS | Status: DC | PRN
  Administered 2017-09-25 – 2017-09-28 (×16): 2 mg via INTRAVENOUS
  Administered 2017-09-29 (×2): 1 mg via INTRAVENOUS
  Filled 2017-09-25 (×17): qty 1

## 2017-09-25 MED ORDER — NEOSTIGMINE METHYLSULFATE 10 MG/10ML IV SOLN
INTRAVENOUS | Status: DC | PRN
  Administered 2017-09-25: 5 mg via INTRAVENOUS

## 2017-09-25 MED ORDER — PROPOFOL 10 MG/ML IV BOLUS
INTRAVENOUS | Status: DC | PRN
  Administered 2017-09-25: 130 mg via INTRAVENOUS
  Administered 2017-09-25: 30 mg via INTRAVENOUS
  Administered 2017-09-25: 50 mg via INTRAVENOUS

## 2017-09-25 MED ORDER — LIDOCAINE HCL (CARDIAC) 20 MG/ML IV SOLN
INTRAVENOUS | Status: DC | PRN
  Administered 2017-09-25: 60 mg via INTRAVENOUS

## 2017-09-25 MED ORDER — FENTANYL CITRATE (PF) 100 MCG/2ML IJ SOLN
INTRAMUSCULAR | Status: DC | PRN
  Administered 2017-09-25: 150 ug via INTRAVENOUS
  Administered 2017-09-25: 100 ug via INTRAVENOUS

## 2017-09-25 MED ORDER — SUCCINYLCHOLINE CHLORIDE 20 MG/ML IJ SOLN
INTRAMUSCULAR | Status: AC
  Filled 2017-09-25: qty 1

## 2017-09-25 MED ORDER — LACTATED RINGERS IV SOLN
INTRAVENOUS | Status: DC
Start: 2017-09-25 — End: 2017-09-29
  Administered 2017-09-25 – 2017-09-28 (×2): via INTRAVENOUS

## 2017-09-25 MED ORDER — DEXAMETHASONE SODIUM PHOSPHATE 10 MG/ML IJ SOLN
INTRAMUSCULAR | Status: AC
  Filled 2017-09-25: qty 1

## 2017-09-25 MED ORDER — FENTANYL CITRATE (PF) 250 MCG/5ML IJ SOLN
INTRAMUSCULAR | Status: AC
  Filled 2017-09-25: qty 5

## 2017-09-25 MED ORDER — PHENYLEPHRINE HCL 10 MG/ML IJ SOLN
INTRAVENOUS | Status: DC | PRN
  Administered 2017-09-25: 25 ug/min via INTRAVENOUS

## 2017-09-25 MED ORDER — BACITRACIN ZINC 500 UNIT/GM EX OINT
TOPICAL_OINTMENT | CUTANEOUS | Status: DC | PRN
  Administered 2017-09-25: 1 via TOPICAL

## 2017-09-25 MED ORDER — PROPOFOL 10 MG/ML IV BOLUS
INTRAVENOUS | Status: AC
  Filled 2017-09-25: qty 20

## 2017-09-25 MED ORDER — FENTANYL CITRATE (PF) 100 MCG/2ML IJ SOLN
25.0000 ug | INTRAMUSCULAR | Status: DC | PRN
  Administered 2017-09-25 (×2): 50 ug via INTRAVENOUS
  Administered 2017-09-25 (×2): 25 ug via INTRAVENOUS

## 2017-09-25 MED ORDER — LACTATED RINGERS IV SOLN
INTRAVENOUS | Status: DC | PRN
  Administered 2017-09-25: 12:00:00 via INTRAVENOUS

## 2017-09-25 MED ORDER — ONDANSETRON HCL 4 MG/2ML IJ SOLN
INTRAMUSCULAR | Status: AC
  Filled 2017-09-25: qty 2

## 2017-09-25 MED ORDER — ESMOLOL HCL 100 MG/10ML IV SOLN
INTRAVENOUS | Status: AC
  Filled 2017-09-25: qty 10

## 2017-09-25 MED ORDER — THROMBIN 20000 UNITS EX SOLR
CUTANEOUS | Status: AC
  Filled 2017-09-25: qty 20000

## 2017-09-25 MED ORDER — 0.9 % SODIUM CHLORIDE (POUR BTL) OPTIME
TOPICAL | Status: DC | PRN
  Administered 2017-09-25: 1000 mL

## 2017-09-25 MED ORDER — MIDAZOLAM HCL 2 MG/2ML IJ SOLN
INTRAMUSCULAR | Status: AC
  Filled 2017-09-25: qty 2

## 2017-09-25 MED ORDER — FENTANYL CITRATE (PF) 100 MCG/2ML IJ SOLN
INTRAMUSCULAR | Status: AC
  Administered 2017-09-25: 25 ug via INTRAVENOUS
  Filled 2017-09-25: qty 2

## 2017-09-25 MED ORDER — ROCURONIUM BROMIDE 100 MG/10ML IV SOLN
INTRAVENOUS | Status: DC | PRN
  Administered 2017-09-25: 5 mg via INTRAVENOUS
  Administered 2017-09-25: 30 mg via INTRAVENOUS
  Administered 2017-09-25: 60 mg via INTRAVENOUS

## 2017-09-25 MED ORDER — ONDANSETRON HCL 4 MG/2ML IJ SOLN
INTRAMUSCULAR | Status: DC | PRN
  Administered 2017-09-25: 4 mg via INTRAVENOUS

## 2017-09-25 MED ORDER — GLYCOPYRROLATE 0.2 MG/ML IJ SOLN
INTRAMUSCULAR | Status: DC | PRN
  Administered 2017-09-25: .8 mg via INTRAVENOUS

## 2017-09-25 MED ORDER — MORPHINE SULFATE (PF) 4 MG/ML IV SOLN
INTRAVENOUS | Status: AC
  Filled 2017-09-25: qty 1

## 2017-09-25 MED ORDER — BACITRACIN ZINC 500 UNIT/GM EX OINT
TOPICAL_OINTMENT | CUTANEOUS | Status: AC
  Filled 2017-09-25: qty 28.35

## 2017-09-25 MED ORDER — WHITE PETROLATUM EX OINT
TOPICAL_OINTMENT | CUTANEOUS | Status: AC
  Administered 2017-09-25: 0.2
  Filled 2017-09-25: qty 28.35

## 2017-09-25 MED ORDER — DEXAMETHASONE SODIUM PHOSPHATE 10 MG/ML IJ SOLN
INTRAMUSCULAR | Status: DC | PRN
  Administered 2017-09-25: 10 mg via INTRAVENOUS

## 2017-09-25 MED ORDER — ESMOLOL HCL 100 MG/10ML IV SOLN
INTRAVENOUS | Status: DC | PRN
  Administered 2017-09-25: 15 mg via INTRAVENOUS
  Administered 2017-09-25: 10 mg via INTRAVENOUS

## 2017-09-25 MED ORDER — PHENYLEPHRINE 40 MCG/ML (10ML) SYRINGE FOR IV PUSH (FOR BLOOD PRESSURE SUPPORT)
PREFILLED_SYRINGE | INTRAVENOUS | Status: AC
  Filled 2017-09-25: qty 10

## 2017-09-25 MED ORDER — OXYCODONE HCL 5 MG PO TABS
5.0000 mg | ORAL_TABLET | ORAL | Status: DC | PRN
Start: 2017-09-25 — End: 2017-09-30
  Administered 2017-09-25 – 2017-09-30 (×17): 10 mg via ORAL
  Filled 2017-09-25 (×17): qty 2

## 2017-09-25 MED ORDER — LIDOCAINE-EPINEPHRINE 0.5 %-1:200000 IJ SOLN
INTRAMUSCULAR | Status: AC
  Filled 2017-09-25: qty 1

## 2017-09-25 MED ORDER — THROMBIN 5000 UNITS EX SOLR
CUTANEOUS | Status: AC
  Filled 2017-09-25: qty 5000

## 2017-09-25 MED ORDER — FENTANYL CITRATE (PF) 100 MCG/2ML IJ SOLN
INTRAMUSCULAR | Status: AC
  Filled 2017-09-25: qty 2

## 2017-09-25 MED ORDER — MICROFIBRILLAR COLL HEMOSTAT EX POWD
CUTANEOUS | Status: AC
  Filled 2017-09-25: qty 5

## 2017-09-25 SURGICAL SUPPLY — 75 items
BLADE CLIPPER SURG (BLADE) IMPLANT
BLADE EYE SICKLE 84 5 BEAV (BLADE) ×2 IMPLANT
BLADE EYE SICKLE 84 5MM BEAV (BLADE) ×1
BLADE SURG 15 STRL LF DISP TIS (BLADE) IMPLANT
BLADE SURG 15 STRL SS (BLADE)
BLADE ULTRA TIP 2M (BLADE) IMPLANT
BUR ACORN 6.0 PRECISION (BURR) ×2 IMPLANT
BUR ACORN 6.0MM PRECISION (BURR) ×1
CABLE BIPOLOR RESECTION CORD (MISCELLANEOUS) ×3 IMPLANT
CANISTER SUCT 3000ML PPV (MISCELLANEOUS) ×3 IMPLANT
CARTRIDGE OIL MAESTRO DRILL (MISCELLANEOUS) ×1 IMPLANT
CATH VENTRIC 35X38 W/TROCAR LG (CATHETERS) IMPLANT
CLIP VESOCCLUDE MED 6/CT (CLIP) IMPLANT
COVER BACK TABLE 60X90IN (DRAPES) IMPLANT
DECANTER SPIKE VIAL GLASS SM (MISCELLANEOUS) ×3 IMPLANT
DIFFUSER DRILL AIR PNEUMATIC (MISCELLANEOUS) ×3 IMPLANT
DRAIN SUBARACHNOID (WOUND CARE) ×3 IMPLANT
DRAPE MICROSCOPE LEICA (MISCELLANEOUS) ×3 IMPLANT
DRAPE NEUROLOGICAL W/INCISE (DRAPES) ×3 IMPLANT
DRAPE WARM FLUID 44X44 (DRAPE) ×3 IMPLANT
DRSG ADAPTIC 3X8 NADH LF (GAUZE/BANDAGES/DRESSINGS) ×3 IMPLANT
DRSG OPSITE 4X5.5 SM (GAUZE/BANDAGES/DRESSINGS) ×6 IMPLANT
DRSG OPSITE POSTOP 4X6 (GAUZE/BANDAGES/DRESSINGS) ×3 IMPLANT
DURAPREP 26ML APPLICATOR (WOUND CARE) ×3 IMPLANT
DURAPREP 6ML APPLICATOR 50/CS (WOUND CARE) ×3 IMPLANT
ELECT CAUTERY BLADE 6.4 (BLADE) ×3 IMPLANT
ELECT REM PT RETURN 9FT ADLT (ELECTROSURGICAL) ×3
ELECTRODE REM PT RTRN 9FT ADLT (ELECTROSURGICAL) ×1 IMPLANT
EVACUATOR 1/8 PVC DRAIN (DRAIN) IMPLANT
EVACUATOR SILICONE 100CC (DRAIN) IMPLANT
GAUZE SPONGE 4X4 12PLY STRL (GAUZE/BANDAGES/DRESSINGS) IMPLANT
GAUZE SPONGE 4X4 16PLY XRAY LF (GAUZE/BANDAGES/DRESSINGS) IMPLANT
GLOVE ECLIPSE 6.5 STRL STRAW (GLOVE) ×6 IMPLANT
GLOVE EXAM NITRILE LRG STRL (GLOVE) IMPLANT
GLOVE EXAM NITRILE XL STR (GLOVE) IMPLANT
GLOVE EXAM NITRILE XS STR PU (GLOVE) IMPLANT
GLOVE SURG SS PI 6.5 STRL IVOR (GLOVE) ×3 IMPLANT
GLOVE SURG SS PI 7.0 STRL IVOR (GLOVE) ×3 IMPLANT
GOWN STRL REUS W/ TWL LRG LVL3 (GOWN DISPOSABLE) ×2 IMPLANT
GOWN STRL REUS W/ TWL XL LVL3 (GOWN DISPOSABLE) IMPLANT
GOWN STRL REUS W/TWL 2XL LVL3 (GOWN DISPOSABLE) IMPLANT
GOWN STRL REUS W/TWL LRG LVL3 (GOWN DISPOSABLE) ×4
GOWN STRL REUS W/TWL XL LVL3 (GOWN DISPOSABLE)
GRAFT DURAGEN MATRIX 2WX2L ×6 IMPLANT
HEMOSTAT SURGICEL 2X14 (HEMOSTASIS) ×3 IMPLANT
KIT BASIN OR (CUSTOM PROCEDURE TRAY) ×3 IMPLANT
KIT DRAIN CSF ACCUDRAIN (MISCELLANEOUS) IMPLANT
KIT ROOM TURNOVER OR (KITS) ×3 IMPLANT
MARKER SKIN DUAL TIP RULER LAB (MISCELLANEOUS) IMPLANT
NEEDLE HYPO 25X1 1.5 SAFETY (NEEDLE) ×3 IMPLANT
NEEDLE SPNL 18GX3.5 QUINCKE PK (NEEDLE) IMPLANT
NS IRRIG 1000ML POUR BTL (IV SOLUTION) ×3 IMPLANT
OIL CARTRIDGE MAESTRO DRILL (MISCELLANEOUS) ×3
PACK CRANIOTOMY CUSTOM (CUSTOM PROCEDURE TRAY) ×3 IMPLANT
PAD ARMBOARD 7.5X6 YLW CONV (MISCELLANEOUS) ×9 IMPLANT
PATTIES SURGICAL .5 X.5 (GAUZE/BANDAGES/DRESSINGS) IMPLANT
PATTIES SURGICAL .5 X3 (DISPOSABLE) IMPLANT
PATTIES SURGICAL 1/4 X 3 (GAUZE/BANDAGES/DRESSINGS) IMPLANT
PATTIES SURGICAL 1X1 (DISPOSABLE) IMPLANT
PIN MAYFIELD SKULL DISP (PIN) IMPLANT
SEALANT ADHERUS EXTEND TIP (MISCELLANEOUS) ×6 IMPLANT
SPONGE NEURO XRAY DETECT 1X3 (DISPOSABLE) IMPLANT
SPONGE SURGIFOAM ABS GEL 100 (HEMOSTASIS) IMPLANT
STAPLER SKIN PROX WIDE 3.9 (STAPLE) ×3 IMPLANT
SUT ETHILON 3 0 FSL (SUTURE) ×3 IMPLANT
SUT NURALON 4 0 TR CR/8 (SUTURE) ×3 IMPLANT
SUT PROLENE 6 0 BV (SUTURE) IMPLANT
SUT VIC AB 2-0 CT1 18 (SUTURE) ×3 IMPLANT
SUT VIC AB 3-0 SH 8-18 (SUTURE) ×3 IMPLANT
SYR CONTROL 10ML LL (SYRINGE) ×6 IMPLANT
TOWEL GREEN STERILE (TOWEL DISPOSABLE) ×3 IMPLANT
TOWEL GREEN STERILE FF (TOWEL DISPOSABLE) IMPLANT
TRAY FOLEY W/METER SILVER 16FR (SET/KITS/TRAYS/PACK) IMPLANT
UNDERPAD 30X30 (UNDERPADS AND DIAPERS) IMPLANT
WATER STERILE IRR 1000ML POUR (IV SOLUTION) ×3 IMPLANT

## 2017-09-25 NOTE — Anesthesia Procedure Notes (Signed)
Procedure Name: Intubation Date/Time: 09/25/2017 11:57 AM Performed by: Inda Coke, CRNA Pre-anesthesia Checklist: Patient identified, Emergency Drugs available, Suction available and Patient being monitored Patient Re-evaluated:Patient Re-evaluated prior to induction Oxygen Delivery Method: Circle System Utilized Preoxygenation: Pre-oxygenation with 100% oxygen Induction Type: IV induction Ventilation: Mask ventilation without difficulty Laryngoscope Size: Mac and 3 Grade View: Grade I Tube type: Oral Number of attempts: 1 Airway Equipment and Method: Stylet and Oral airway Placement Confirmation: ETT inserted through vocal cords under direct vision,  positive ETCO2 and breath sounds checked- equal and bilateral Secured at: 22 cm Tube secured with: Tape Dental Injury: Teeth and Oropharynx as per pre-operative assessment

## 2017-09-25 NOTE — Op Note (Signed)
09/25/2017  2:29 PM  PATIENT:  Angela Mosley  32 y.o. female whom underwent a chiari decompression 09/09/2017, reoperation for csf leak and purulent drainage 09/21/17, and today for persistent csf leak.   PRE-OPERATIVE DIAGNOSIS:  CSF LEAK(Chiari)  POST-OPERATIVE DIAGNOSIS:  CSF LEAK(Chiari)  PROCEDURE:  Procedure(s): Repair of CHIARI CSF LEAK, LUMBAR DRAIN PLACEMENT  SURGEON:   Surgeon(s): Coletta Memosabbell, Nyelle Wolfson, MD Maeola HarmanStern, Joseph, MD  ASSISTANTS:stern, joseph  ANESTHESIA:   general  EBL:  Total I/O In: 800 [I.V.:800] Out: 25 [Blood:25]  BLOOD ADMINISTERED:none  CELL SAVER GIVEN:none  COUNT:per nursing  DRAINS: lumbar drain   SPECIMEN:  No Specimen  DICTATION: Angela Mosley was taken to the operating room, intubated and placed under a general anesthetic without difficulty. She was positioned on the bed in a lateral decubitus position. Under sterile conditions I placed a lumbar drain at approximately L3/4, and achieved a good flow from the catheter. I then placed a three pin [place]field head holder on her head. We rolled her into a prone position on flat rolls. All pressure points were properly padded. She had her neck in a slight tuck. I prepped the wound in a sterile manner, along with draping her. I opened the incision removing the sutures, and there was no gush of spinal fluid. I opened enough of the incision,  so that the entire dural graft and closure were visible. We looked very closely at the closure, had the anethtetist provide a valsalva maneuver multiple times. We could never see a leak. I did place one stitich along the long side of the triangle on the right. I then placed duragen on the suture line, then a dural sealant. I closed the wound in layers approximating the nuchal ligament, and the  subcutaneous plane with vicryl sutures. The skin edges with a running nylon stitch.  I applied a sterile dressing. I removed her head from the adapter, then rolled her onto the bed. I  removed the head holder, and she was then extubated.   PLAN OF CARE: Admit to inpatient   PATIENT DISPOSITION:  PACU - hemodynamically stable.   Delay start of Pharmacological VTE agent (>24hrs) due to surgical blood loss or risk of bleeding:  yes

## 2017-09-25 NOTE — Anesthesia Preprocedure Evaluation (Signed)
Anesthesia Evaluation  Patient identified by MRN, date of birth, ID band Patient awake    Reviewed: Allergy & Precautions, NPO status , Patient's Chart, lab work & pertinent test results  History of Anesthesia Complications Negative for: history of anesthetic complications  Airway Mallampati: II  TM Distance: >3 FB Neck ROM: Full    Dental  (+) Missing,    Pulmonary former smoker,    Pulmonary exam normal breath sounds clear to auscultation       Cardiovascular negative cardio ROS Normal cardiovascular exam Rhythm:Regular Rate:Normal     Neuro/Psych  Headaches, PSYCHIATRIC DISORDERS Anxiety Depression Bipolar Disorder PTSD (post-traumatic stress disorder)Decreased hearing in right ear    GI/Hepatic Neg liver ROS, GERD  Medicated and Controlled,  Endo/Other  negative endocrine ROS  Renal/GU negative Renal ROS     Musculoskeletal negative musculoskeletal ROS (+)   Abdominal (+) + obese,   Peds  Hematology  (+) anemia ,   Anesthesia Other Findings CHIARI MALFORMATION TYPE I  Reproductive/Obstetrics hcg negative                             Anesthesia Physical Anesthesia Plan  ASA: II  Anesthesia Plan: General   Post-op Pain Management:    Induction: Intravenous  PONV Risk Score and Plan: 3 and Ondansetron and Dexamethasone  Airway Management Planned: Oral ETT  Additional Equipment: None  Intra-op Plan:   Post-operative Plan: Extubation in OR  Informed Consent: I have reviewed the patients History and Physical, chart, labs and discussed the procedure including the risks, benefits and alternatives for the proposed anesthesia with the patient or authorized representative who has indicated his/her understanding and acceptance.   Dental advisory given  Plan Discussed with: CRNA and Surgeon  Anesthesia Plan Comments:         Anesthesia Quick Evaluation

## 2017-09-25 NOTE — Progress Notes (Signed)
Dr. Venetia MaxonStern notified of patient in tears, shaking in bed in pain. New PRN medications ordered.

## 2017-09-25 NOTE — Transfer of Care (Signed)
Immediate Anesthesia Transfer of Care Note  Patient: Angela Mosley  Procedure(s) Performed: Repair of CHIARI CSF LEAK, LUMBAR DRAIN PLACEMENT (N/A Head)  Patient Location: PACU  Anesthesia Type:General  Level of Consciousness: awake and alert   Airway & Oxygen Therapy: Patient Spontanous Breathing and Patient connected to nasal cannula oxygen  Post-op Assessment: Report given to RN, Post -op Vital signs reviewed and stable, Patient moving all extremities X 4 and Patient able to stick tongue midline  Post vital signs: Reviewed and stable  Last Vitals:  Vitals:   09/25/17 0848 09/25/17 1407  BP: 102/68 (P) 118/78  Pulse: 88 (P) 91  Resp: 18 (P) 20  Temp: 36.9 C (P) 36.5 C  SpO2: 100% (P) 100%    Last Pain:  Vitals:   09/25/17 0900  TempSrc:   PainSc: 0-No pain      Patients Stated Pain Goal: 3 (09/24/17 0550)  Complications: No apparent anesthesia complications

## 2017-09-25 NOTE — Progress Notes (Signed)
PT Cancellation Note  Patient Details Name: Angela Mosley MRN: [id number] DOB: November 24, 1985   Cancelled Treatment:    Reason Eval/Treat Not Completed: Patient at procedure or test/unavailable. Pt in surgery having lumbar drain placed. Will check back tomorrow.   Kallie LocksHannah Robson Trickey, PTA Pager [phone] Acute Rehab   Sheral ApleyHannah E Raynell Scott 09/25/2017, 1:41 PM

## 2017-09-25 NOTE — Anesthesia Postprocedure Evaluation (Signed)
Anesthesia Post Note  Patient: Angela Mosley  Procedure(s) Performed: Repair of CHIARI CSF LEAK, LUMBAR DRAIN PLACEMENT (N/A Head)     Patient location during evaluation: PACU Anesthesia Type: General Level of consciousness: awake and alert Pain management: pain level controlled Vital Signs Assessment: post-procedure vital signs reviewed and stable Respiratory status: spontaneous breathing, nonlabored ventilation, respiratory function stable and patient connected to nasal cannula oxygen Cardiovascular status: blood pressure returned to baseline and stable Postop Assessment: no apparent nausea or vomiting Anesthetic complications: no    Last Vitals:  Vitals:   09/25/17 1430 09/25/17 1437  BP: 125/83 128/85  Pulse: 78 77  Resp: 16 14  Temp:    SpO2: 98% 99%    Last Pain:  Vitals:   09/25/17 1430  TempSrc:   PainSc: Asleep                 Alany Borman

## 2017-09-25 NOTE — Progress Notes (Signed)
Patient ID: Angela Mosley, female   DOB: 10-Sep-1985, 32 y.o.   MRN: [id number] BP 102/68 (BP Location: Left Arm)   Pulse 88   Temp 98.4 F (36.9 C) (Oral)   Resp 18   Ht 5\' 2"  (1.575 m)   Wt 92.5 kg (204 lb)   LMP 08/30/2017   SpO2 100%   BMI 37.31 kg/m  Angela Mosley will be taken to the operating room to repair her csf leak.  She is still draining fluid, therefore a lumbar drain will be placed.

## 2017-09-26 DIAGNOSIS — Z978 Presence of other specified devices: Secondary | ICD-10-CM

## 2017-09-26 DIAGNOSIS — G935 Compression of brain: Secondary | ICD-10-CM

## 2017-09-26 LAB — AEROBIC/ANAEROBIC CULTURE W GRAM STAIN (SURGICAL/DEEP WOUND)

## 2017-09-26 LAB — AEROBIC/ANAEROBIC CULTURE (SURGICAL/DEEP WOUND)

## 2017-09-26 NOTE — Progress Notes (Signed)
Subjective: Patient reports leakage from back and headache.  Objective: Vital signs in last 24 hours: Temp:  [97.7 F (36.5 C)-99.3 F (37.4 C)] 99.3 F (37.4 C) (03/03 0434) Pulse Rate:  [75-123] 87 (03/03 0700) Resp:  [13-27] 18 (03/03 0700) BP: (102-134)/(63-92) 111/67 (03/03 0700) SpO2:  [95 %-100 %] 97 % (03/03 0700)  Intake/Output from previous day: 03/02 0701 - 03/03 0700 In: 2397 [I.V.:997; IV Piggyback:1400] Out: 1375 [Urine:1350; Blood:25] Intake/Output this shift: No intake/output data recorded.  Physical Exam: Lumbar drain not draining (as of middle of night).  There is, however, drainage around tubing.  Patient's scalp dressing appears intact and dry.    Lab Results: No results for input(s): WBC, HGB, HCT, PLT in the last 72 hours. BMET Recent Labs    09/24/17 0529  NA 137  K 3.7  CL 104  CO2 25  GLUCOSE 101*  BUN 7  CREATININE 0.76  CALCIUM 8.8*    Studies/Results: No results found.  Assessment/Plan: I attempted to adjust lumbar drain, but it appears to have been pulled back.  I will leave it in place, since CSF appears to be draining around tubing and continue HOB at 30+ degrees.  If drainage does not resume, Dr. Franky Machoabbell can decide whether to reposition lumbar drain or to discontinue it.    LOS: 5 days    Angela Mosley,Angela Mosley D, MD 09/26/2017, 10:28 AM

## 2017-09-26 NOTE — Progress Notes (Signed)
Patient was awaken with wet feeling in lower back. RN notified and noticed a small amount of fluid leaking from her lumbar dressing. The pad was also wet and cool. The honeycomb dressing was moist and spongy. Dr. Venetia MaxonStern notified and advised to reinforce the dressing and allow it to drain. He also informed the nightshift nurse he would be on the unit later today to troubleshoot the limitor since it was not draining properly. Patient is satisfied with knowing he will be on the unit today.

## 2017-09-26 NOTE — Progress Notes (Addendum)
INFECTIOUS DISEASE PROGRESS NOTE  ID: Angela Mosley is a 32 y.o. female with  Active Problems:   Chiari malformation type I (HCC)   Wound infection after surgery  Subjective: C/o headache.   Abtx:  Anti-infectives (From admission, onward)   Start     Dose/Rate Route Frequency Ordered Stop   09/22/17 1400  vancomycin (VANCOCIN) IVPB 1000 mg/200 mL premix     1,000 mg 200 mL/hr over 60 Minutes Intravenous Every 8 hours 09/22/17 1259     09/21/17 2200  vancomycin (VANCOCIN) IVPB 1000 mg/200 mL premix     1,000 mg 200 mL/hr over 60 Minutes Intravenous  Once 09/21/17 2026 09/21/17 2210   09/21/17 2009  valACYclovir (VALTREX) tablet 1,000 mg     1,000 mg Oral Daily PRN 09/21/17 2010     09/21/17 1834  vancomycin (VANCOCIN) powder  Status:  Discontinued       As needed 09/21/17 1835 09/21/17 1842      Medications:  Scheduled: . celecoxib  200 mg Oral Q12H  . cyclobenzaprine  10 mg Oral TID  . docusate sodium  100 mg Oral BID  . gabapentin  300 mg Oral TID  . heparin injection (subcutaneous)  5,000 Units Subcutaneous Q8H  . pantoprazole  40 mg Oral BID  . sucralfate  1 g Oral TID WC & HS    Objective: Vital signs in last 24 hours: Temp:  [97.7 F (36.5 C)-99.3 F (37.4 C)] 99.3 F (37.4 C) (03/03 0434) Pulse Rate:  [75-123] 87 (03/03 0700) Resp:  [13-27] 18 (03/03 0700) BP: (102-134)/(63-92) 111/67 (03/03 0700) SpO2:  [95 %-100 %] 97 % (03/03 0700)   General appearance: alert, cooperative and no distress Resp: clear to auscultation bilaterally Cardio: regular rate and rhythm GI: normal findings: bowel sounds normal and soft, non-tender  Lab Results Recent Labs    09/24/17 0529  NA 137  K 3.7  CL 104  CO2 25  BUN 7  CREATININE 0.76   Liver Panel No results for input(s): PROT, ALBUMIN, AST, ALT, ALKPHOS, BILITOT, BILIDIR, IBILI in the last 72 hours. Sedimentation Rate No results for input(s): ESRSEDRATE in the last 72 hours. C-Reactive Protein No  results for input(s): CRP in the last 72 hours.  Microbiology: Recent Results (from the past 240 hour(s))  Aerobic/Anaerobic Culture (surgical/deep wound)     Status: None (Preliminary result)   Collection Time: 09/21/17  6:04 PM  Result Value Ref Range Status   Specimen Description WOUND  Final   Special Requests POSTERIOR CERVICAL  Final   Gram Stain   Final    ABUNDANT WBC PRESENT, PREDOMINANTLY PMN NO ORGANISMS SEEN Gram Stain Report Called to,Read Back By and Verified With: T CITTY RN [phone] 09/21/17 A BROWNING Performed at [facility], [address], [place], [place] [address]    Culture   Final    RARE STAPHYLOCOCCUS SPECIES (COAGULASE NEGATIVE) NO ANAEROBES ISOLATED; CULTURE IN PROGRESS FOR 5 DAYS    Report Status PENDING  Incomplete    Studies/Results: No results found.   Assessment/Plan: Chjiari malformation, repair Wound infection (MRSE?)  Total days of antibiotics: 5 vanco  She would like PIC, I discussed with RN- will hold for now as she has 2 functioning IVs and is not having problems with these.  Drain placed yesterday, no new Cx or lab.  Will continue to watch.          [name] MD, FACP Infectious Diseases (pager) [phone] [email or web address] 09/26/2017,  11:40 AM  LOS: 5 days

## 2017-09-26 NOTE — Progress Notes (Signed)
Pt does not take Diamox anymore and would like to not receive while in hospital. States it causes her head to hurt. Dr. Venetia MaxonStern aware and okay with discontinuing this med.

## 2017-09-26 NOTE — Progress Notes (Signed)
Consult obtained to place Midline. Patient is getting vancomycin IV and has two functioning IVs. Contacted Dr. Ninetta LightsHatcher concerning Midline placement, as [facility] can not run vancomycin greater than six days. Plans are for IV antibiotics to run during her course at the hospital and will not be sent home with IV antibiotics.

## 2017-09-27 ENCOUNTER — Encounter (HOSPITAL_COMMUNITY): Payer: Self-pay | Admitting: Neurosurgery

## 2017-09-27 DIAGNOSIS — G479 Sleep disorder, unspecified: Secondary | ICD-10-CM

## 2017-09-27 DIAGNOSIS — R51 Headache: Secondary | ICD-10-CM

## 2017-09-27 LAB — BASIC METABOLIC PANEL
Anion gap: 10 (ref 5–15)
BUN: 8 mg/dL (ref 6–20)
CHLORIDE: 105 mmol/L (ref 101–111)
CO2: 24 mmol/L (ref 22–32)
CREATININE: 0.71 mg/dL (ref 0.44–1.00)
Calcium: 8.7 mg/dL — ABNORMAL LOW (ref 8.9–10.3)
GFR calc Af Amer: >60 mL/min (ref >60)
GFR calc non Af Amer: >60 mL/min (ref >60)
Glucose, Bld: 102 mg/dL — ABNORMAL HIGH (ref 65–99)
POTASSIUM: 3.7 mmol/L (ref 3.5–5.1)
Sodium: 139 mmol/L (ref 135–145)

## 2017-09-27 MED ORDER — LINEZOLID 600 MG PO TABS
600.0000 mg | ORAL_TABLET | Freq: Two times a day (BID) | ORAL | Status: DC
  Administered 2017-09-27 – 2017-09-30 (×7): 600 mg via ORAL
  Filled 2017-09-27 (×8): qty 1

## 2017-09-27 MED ORDER — DIAZEPAM 5 MG PO TABS
5.0000 mg | ORAL_TABLET | Freq: Four times a day (QID) | ORAL | Status: DC | PRN
  Administered 2017-09-27 – 2017-09-30 (×4): 5 mg via ORAL
  Filled 2017-09-27 (×4): qty 1

## 2017-09-27 MED ORDER — OXYCODONE HCL ER 10 MG PO T12A
10.0000 mg | EXTENDED_RELEASE_TABLET | Freq: Two times a day (BID) | ORAL | Status: DC
  Administered 2017-09-27 – 2017-09-30 (×7): 10 mg via ORAL
  Filled 2017-09-27 (×7): qty 1

## 2017-09-27 MED FILL — Thrombin For Soln 20000 Unit: CUTANEOUS | Qty: 1 | Status: AC

## 2017-09-27 NOTE — Progress Notes (Signed)
Dr. Franky Machoabbell called regarding patients increase of pain with no relief, and  Photophobia. New orders received and carried out. Will continue to monitor. Dicie BeamFrazier, Novalee Horsfall RN BSN.

## 2017-09-27 NOTE — Progress Notes (Signed)
Patient ID: Hilarie FredricksonJeronae Mosley, female   DOB: 04/28/1986, 32 y.o.   MRN: [id number] BP 113/85 (BP Location: Right Arm)   Pulse 94   Temp 98.3 F (36.8 C) (Oral)   Resp 15   Ht 5\' 2"  (1.575 m)   Wt 92.5 kg (204 lb)   LMP 08/30/2017   SpO2 98%   BMI 37.31 kg/m  Alert and oriented x 4, speech is clear and fluent Wound is clean, dry, no signs of infection Lumbar drain not working Will plan on lumbar puncture tomorrow to assess csf pressure

## 2017-09-27 NOTE — Progress Notes (Signed)
Dr.Cabbell notified patients pain is increasing despite all interventions. Dressing remains dry and intact with no indication of drainage. New orders received and carried out. Will continue to monitor. Dicie BeamFrazier, Declan Mier RN BSN.

## 2017-09-27 NOTE — Progress Notes (Signed)
    [facility]   Reason for visit: Follow up on wound infection  Interval History: headache, difficulty sleeping, poor IV access over weekend.  No fever, no new culture growth  Physical Exam: Constitutional:  Vitals:   09/27/17 1000 09/27/17 1100  BP: 93/81 119/76  Pulse: 88 76  Resp: (!) 26 14  Temp:    SpO2: 100% 98%   sleeping Respiratory: Normal respiratory effort; CTA B Cardiovascular: RRR GI: soft, nt, nd  Review of Systems: Gastrointestinal: negative for diarrhea  Lab Results  Component Value Date   WBC 10.5 09/20/2017   HGB 13.1 09/20/2017   HCT 40.6 09/20/2017   MCV 94.9 09/20/2017   PLT 342 09/20/2017    Lab Results  Component Value Date   CREATININE 0.71 09/27/2017   BUN 8 09/27/2017   NA 139 09/27/2017   K 3.7 09/27/2017   CL 105 09/27/2017   CO2 24 09/27/2017    Lab Results  Component Value Date   ALT 16 06/13/2016   AST 22 06/13/2016   ALKPHOS 47 06/13/2016     Microbiology: Recent Results (from the past 240 hour(s))  Aerobic/Anaerobic Culture (surgical/deep wound)     Status: None   Collection Time: 09/21/17  6:04 PM  Result Value Ref Range Status   Specimen Description WOUND  Final   Special Requests POSTERIOR CERVICAL  Final   Gram Stain   Final    ABUNDANT WBC PRESENT, PREDOMINANTLY PMN NO ORGANISMS SEEN Gram Stain Report Called to,Read Back By and Verified With: T CITTY RN [phone] 09/21/17 A BROWNING    Culture   Final    RARE STAPHYLOCOCCUS SPECIES (COAGULASE NEGATIVE) NO ANAEROBES ISOLATED Performed at [facility], [address] [place], [place] [address]    Report Status 09/26/2017 FINAL  Final    Impression/Plan:  1. Wound infection - superficial, no necrotic tissue underneath.  Culture with CoNS.  No new growth.  I recommend 2 weeks treatment with linezolid.  Will reassess as an outpatient if longer course needed or if healing well.   I will stop vancomycin Start linezolid now, oral  therapy  2.  Medication monitoring - will check cbc and cmp at follow up.

## 2017-09-27 NOTE — Progress Notes (Signed)
   09/27/17 1100  Clinical Encounter Type  Visited With Family  Visit Type Initial  Referral From Chaplain  Consult/Referral To Chaplain  Stress Factors  Family Stress Factors Lack of knowledge;Health changes;Major life changes  Chaplain visited with fiancee of the PT.  He was concerned about the care being provided to the PT.  Chaplain spoke with nurse about explanation of care to fiancee to make sure he understood what is normal and what is to be expected.  Spoke with Director about PT Steffanie RainwaterFiancee concerns but also his compliments to the nursing staff.  PT will be seen when doctor does rounding but nurse will speak with him in the interim

## 2017-09-27 NOTE — Progress Notes (Signed)
PT Cancellation Note  Patient Details Name: Angela FredricksonJeronae Mosley MRN: [id number] DOB: April 20, 1986   Cancelled Treatment:    Reason Eval/Treat Not Completed: Medical issues which prohibited therapy(pt on bedrest with HOB 30 and not appropriate at this time)   Kamilo Och B [place] Kretz 09/27/2017, 7:41 AM  Delaney MeigsMaija Tabor Chaniqua Brisby, PT [phone]

## 2017-09-27 NOTE — Progress Notes (Signed)
Pharmacy Antibiotic Note  Angela Mosley is a [age] y.o. female admitted on 09/21/2017 with cervical wound.  Pharmacy has been consulted for Vancomycin  Dosing.  The patient's renal function remains stable, SCr 0.71, CrCl~100 ml/min. Culture is growing coag neg staph - no susceptibilities reported. Patient being followed by ID team. Pt will likely get PICC placed for IV antibiotics. Will continue current dose.   Plan: 1. Continue Vancomycin 1g IV every 8 hours 2. Will continue to follow renal function, culture results, LOT, and antibiotic de-escalation plans   Height: 5\' 2"  (157.5 cm) Weight: 204 lb (92.5 kg) IBW/kg (Calculated) : 50.1  Temp (24hrs), Avg:98.5 F (36.9 C), Min:98.1 F (36.7 C), Max:98.9 F (37.2 C)  Recent Labs  Lab 09/24/17 0529 09/24/17 1343 09/27/17 0408  CREATININE 0.76  --  0.71  VANCOTROUGH  --  15  --     Estimated Creatinine Clearance: 108.9 mL/min (by C-G formula based on SCr of 0.71 mg/dL).    Allergies  Allergen Reactions  . Latex Hives  . Latuda [Lurasidone Hcl] Hives, Itching and Other (See Comments)    Caused throat to itch badly  . Oxycodone-Acetaminophen Hives    Pt states the reaction may have been due to her laundry detergent.  . Phenergan [Promethazine] Other (See Comments)    Caused aggression and rage  . Reglan [Metoclopramide] Other (See Comments)    Aggressive and angry  . Tape Hives  . Hydrocodone Hives and Itching    Vancomycin  2/27>  2/26 wound cx > rare coag neg staph - pending susceptibilities  Thank you for allowing pharmacy to be a part of this patient's care.  Ruben Imony Shina Wass, PharmD Clinical Pharmacist 09/27/2017 10:45 AM

## 2017-09-27 NOTE — Progress Notes (Signed)
Patient ID: Angela FredricksonJeronae Mosley, female   DOB: 15-Jul-1986, 32 y.o.   MRN: [id number] Subjective: While I was passing through the ICU seeing another patient, I was asked to see the patient and evaluate her drain by the nurses.  The patient is alert and pleasant.  She complains of a headache.  Objective: Vital signs in last 24 hours: Temp:  [98.1 F (36.7 C)-98.9 F (37.2 C)] 98.3 F (36.8 C) (03/04 1600) Pulse Rate:  [64-109] 79 (03/04 1500) Resp:  [8-26] 18 (03/04 1500) BP: (72-133)/(56-95) 105/71 (03/04 1500) SpO2:  [97 %-100 %] 98 % (03/04 1500) Estimated body mass index is 37.31 kg/m as calculated from the following:   Height as of this encounter: 5\' 2"  (1.575 m).   Weight as of this encounter: 92.5 kg (204 lb).   Intake/Output from previous day: 03/03 0701 - 03/04 0700 In: 1110 [P.O.:470; I.V.:240; IV Piggyback:400] Out: 1422 [Urine:1350; Drains:72] Intake/Output this shift: Total I/O In: 80 [I.V.:80] Out: 600 [Urine:600]  Physical exam the patient is alert and pleasant and moving all 4 extremities.  Her wound is intact.  There is no drainage.  Patient's lumbar drain is occluded.  I was able to flush distally but not proximally.  Lab Results: No results for input(s): WBC, HGB, HCT, PLT in the last 72 hours. BMET Recent Labs    09/27/17 0408  NA 139  K 3.7  CL 105  CO2 24  GLUCOSE 102*  BUN 8  CREATININE 0.71  CALCIUM 8.7*    Studies/Results: No results found.  Assessment/Plan: Occluded lumbar drain: I am told Dr. Franky Machoabbell is on the way to assess the patient. Options are to replace the lumbar drain or observe her wound since it is not leaking now.  LOS: 6 days     Cristi LoronJeffrey D Joetta Delprado 09/27/2017, 5:17 PM

## 2017-09-28 MED ORDER — LIDOCAINE HCL (PF) 1 % IJ SOLN
INTRAMUSCULAR | Status: AC
  Administered 2017-09-28: 5 mL
  Filled 2017-09-28: qty 5

## 2017-09-28 MED ORDER — MIDAZOLAM HCL 2 MG/2ML IJ SOLN
4.0000 mg | Freq: Once | INTRAMUSCULAR | Status: AC
  Administered 2017-09-28: 2 mg via INTRAVENOUS

## 2017-09-28 MED ORDER — MIDAZOLAM HCL 2 MG/2ML IJ SOLN
INTRAMUSCULAR | Status: AC
  Filled 2017-09-28: qty 4

## 2017-09-28 MED ORDER — LIDOCAINE HCL (PF) 1 % IJ SOLN
INTRAMUSCULAR | Status: AC
  Filled 2017-09-28: qty 30

## 2017-09-28 MED ORDER — DEXMEDETOMIDINE HCL IN NACL 200 MCG/50ML IV SOLN
0.4000 ug/kg/h | INTRAVENOUS | Status: DC
  Administered 2017-09-28: 0.4 ug/kg/h via INTRAVENOUS
  Filled 2017-09-28: qty 50

## 2017-09-28 NOTE — Progress Notes (Signed)
PT Cancellation Note  Patient Details Name: Angela Mosley MRN: [id number] DOB: [birth year]   Cancelled Treatment:    Reason Eval/Treat Not Completed: Medical issues which prohibited therapy(pt not yet cleared for mobility)   Ticia Virgo B Joslynne Klatt 09/28/2017, 7:19 AM  Delaney MeigsMaija Tabor Abigayl Hor, PT [phone]

## 2017-09-28 NOTE — Progress Notes (Signed)
Patient ID: Hilarie FredricksonJeronae Mosley, female   DOB: 1986/01/05, 32 y.o.   MRN: [id number] BP 119/75   Pulse 88   Temp 98.1 F (36.7 C) (Oral)   Resp 20   Ht 5\' 2"  (1.575 m)   Wt 92.5 kg (204 lb)   LMP 08/30/2017   SpO2 98%   BMI 37.31 kg/m  Alert and oriented x 4, speech is clear and fluent. Wound is clean, flat and dry Moving all extremities well Unable to perform a successful lumbar puncture. Will try tomorrow in the or Has headache, but is better overall

## 2017-09-29 LAB — CBC
HCT: 32.6 % — ABNORMAL LOW (ref 36.0–46.0)
Hemoglobin: 10.5 g/dL — ABNORMAL LOW (ref 12.0–15.0)
MCH: 30.7 pg (ref 26.0–34.0)
MCHC: 32.2 g/dL (ref 30.0–36.0)
MCV: 95.3 fL (ref 78.0–100.0)
PLATELETS: 280 10*3/uL (ref 150–400)
RBC: 3.42 MIL/uL — AB (ref 3.87–5.11)
RDW: 12.8 % (ref 11.5–15.5)
WBC: 7.6 10*3/uL (ref 4.0–10.5)

## 2017-09-29 LAB — BASIC METABOLIC PANEL
Anion gap: 8 (ref 5–15)
BUN: 7 mg/dL (ref 6–20)
CALCIUM: 8.9 mg/dL (ref 8.9–10.3)
CHLORIDE: 103 mmol/L (ref 101–111)
CO2: 29 mmol/L (ref 22–32)
CREATININE: 0.71 mg/dL (ref 0.44–1.00)
GFR calc non Af Amer: >60 mL/min (ref >60)
Glucose, Bld: 126 mg/dL — ABNORMAL HIGH (ref 65–99)
Potassium: 3.5 mmol/L (ref 3.5–5.1)
Sodium: 140 mmol/L (ref 135–145)

## 2017-09-29 NOTE — Progress Notes (Signed)
Physical Therapy Treatment Patient Details Name: Angela Mosley MRN: [id number] DOB: 21-Feb-1986 Today's Date: 09/29/2017    History of Present Illness Pt is a 32 y/o female admitted secondary to purulent drainage from the chiari decompression incision. Pt is s/p posterior cervical wound exploration on 2/26. Pt had initial surgery (suboccipital craniectomy for posterior fossa decompression, dural patch graft, C1 laminectomy) on 2/15. PMH including but not limited to depression.    PT Comments    RN reports that pt has been getting up to the bathroom and it is ok to mobilize.  We did not go far due to pain at neck during gait.  She looks very similar to both her last admission and her evaluation note from this admission.  She reports they are going to try a lumbar puncture again this PM, so she will be on bedrest for a while again after that procedure.  PT will check on again tomorrow to try to progress gait as she is able.     Follow Up Recommendations  Outpatient PT;Supervision/Assistance - 24 hour     Equipment Recommendations  None recommended by PT    Recommendations for Other Services  NA     Precautions / Restrictions Precautions Precautions: Cervical Precaution Comments: mildly unsteady on her feet with limited endurance Restrictions Weight Bearing Restrictions: No    Mobility  Bed Mobility Overal bed mobility: Modified Independent             General bed mobility comments: Pt able to move EOB easily, but slowly with HOB elevated and use of the rail.   Transfers Overall transfer level: Needs assistance Equipment used: None Transfers: Sit to/from Stand Sit to Stand: Supervision         General transfer comment: supervision for safety  Ambulation/Gait Ambulation/Gait assistance: Min guard Ambulation Distance (Feet): 45 Feet Assistive device: None Gait Pattern/deviations: Step-through pattern;Decreased stride length Gait velocity: decreased Gait velocity  interpretation: Below normal speed for age/gender General Gait Details: slow, guarded gait pattern, mildly unsteady, min guard assist for safety.           Balance Overall balance assessment: Mild deficits observed, not formally tested                                          Cognition Arousal/Alertness: Awake/alert Behavior During Therapy: WFL for tasks assessed/performed Overall Cognitive Status: Within Functional Limits for tasks assessed                                               Pertinent Vitals/Pain Pain Assessment: Faces Faces Pain Scale: Hurts even more Pain Location: neck and head Pain Descriptors / Indicators: Aching Pain Intervention(s): Limited activity within patient's tolerance;Monitored during session;Repositioned           PT Goals (current goals can now be found in the care plan section) Acute Rehab PT Goals Patient Stated Goal: return to home "sleep in my own bed" Progress towards PT goals: Progressing toward goals    Frequency    Min 3X/week      PT Plan Current plan remains appropriate       AM-PAC PT "6 Clicks" Daily Activity  Outcome Measure  Difficulty turning over in bed (including adjusting bedclothes, sheets and blankets)?: None Difficulty  moving from lying on back to sitting on the side of the bed? : A Little Difficulty sitting down on and standing up from a chair with arms (e.g., wheelchair, bedside commode, etc,.)?: None Help needed moving to and from a bed to chair (including a wheelchair)?: None Help needed walking in hospital room?: A Little Help needed climbing 3-5 steps with a railing? : A Little 6 Click Score: 21    End of Session   Activity Tolerance: Patient limited by fatigue;Patient limited by pain Patient left: in bed;with call bell/phone within reach   PT Visit Diagnosis: Other abnormalities of gait and mobility (R26.89) Pain - Right/Left: (incisional/head) Pain - part of  body: (incisional/head)     Time: [id number] PT Time Calculation (min) (ACUTE ONLY): 17 min  Charges:  $Gait Training: 8-22 mins          Ame Heagle B. Caldwell Kronenberger, PT, DPT [phone]            09/29/2017, 11:38 AM

## 2017-09-29 NOTE — Progress Notes (Signed)
Patient ID: Angela Mosley, female   DOB: Apr 20, 1986, 32 y.o.   MRN: [id number] BP 109/72 (BP Location: Left Arm)   Pulse 89   Temp 99 F (37.2 C) (Oral)   Resp 11   Ht 5\' 2"  (1.575 m)   Wt 92.5 kg (204 lb)   LMP 08/30/2017   SpO2 100%   BMI 37.31 kg/m  Alert and oriented x 4, speech is clear and fluent perrl Moving all extremities well Wound is clean, flat and dry Transfer today Doing well

## 2017-09-30 MED ORDER — LINEZOLID 600 MG PO TABS: 600.0000 mg | ORAL_TABLET | Freq: Two times a day (BID) | ORAL | 0 refills | Status: DC

## 2017-09-30 MED ORDER — OXYCODONE HCL 5 MG PO TABS: 5.0000 mg | ORAL_TABLET | Freq: Four times a day (QID) | ORAL | 0 refills | Status: AC | PRN

## 2017-09-30 MED ORDER — GABAPENTIN 300 MG PO CAPS: 300.0000 mg | ORAL_CAPSULE | Freq: Three times a day (TID) | ORAL | 2 refills | Status: DC

## 2017-09-30 MED ORDER — CYCLOBENZAPRINE HCL 10 MG PO TABS: 10.0000 mg | ORAL_TABLET | Freq: Three times a day (TID) | ORAL | 0 refills | Status: AC

## 2017-09-30 NOTE — Progress Notes (Signed)
Physical Therapy Treatment Patient Details Name: Angela Mosley MRN: [id number] DOB: 03/07/1986 Today's Date: 09/30/2017    History of Present Illness Pt is a 32 y/o female admitted secondary to purulent drainage from the chiari decompression incision. Pt is s/p posterior cervical wound exploration on 2/26. Pt had initial surgery (suboccipital craniectomy for posterior fossa decompression, dural patch graft, C1 laminectomy) on 2/15. PMH including but not limited to depression.    PT Comments    Pt very familiar from last admission. Pleasant and able to improve mobility with reassurance and distraction. Pt educated for continued progression and need to ambulate. Pt fearful of something else going wrong but moving well. Pt with mild HA and cautious.     Follow Up Recommendations  Supervision - Intermittent;Outpatient PT     Equipment Recommendations  None recommended by PT    Recommendations for Other Services       Precautions / Restrictions Precautions Precautions: Cervical    Mobility  Bed Mobility Overal bed mobility: Modified Independent             General bed mobility comments: HOB 45 degrees  Transfers Overall transfer level: Modified independent                  Ambulation/Gait Ambulation/Gait assistance: Min guard Ambulation Distance (Feet): 300 Feet Assistive device: None Gait Pattern/deviations: Step-through pattern;Decreased stride length   Gait velocity interpretation: Below normal speed for age/gender General Gait Details: cautious, slow gait, with improved distance with conversation and distraction   Stairs         General stair comments: pt declined attempting  Wheelchair Mobility    Modified Rankin (Stroke Patients Only)       Balance Overall balance assessment: No apparent balance deficits (not formally assessed)                                          Cognition Arousal/Alertness:  Awake/alert Behavior During Therapy: WFL for tasks assessed/performed Overall Cognitive Status: Within Functional Limits for tasks assessed                                        Exercises      General Comments        Pertinent Vitals/Pain Pain Score: 2  Pain Location: head Pain Descriptors / Indicators: Aching Pain Intervention(s): Limited activity within patient's tolerance;Premedicated before session    Home Living                      Prior Function            PT Goals (current goals can now be found in the care plan section) Progress towards PT goals: Progressing toward goals    Frequency           PT Plan Current plan remains appropriate    Co-evaluation              AM-PAC PT "6 Clicks" Daily Activity  Outcome Measure  Difficulty turning over in bed (including adjusting bedclothes, sheets and blankets)?: None Difficulty moving from lying on back to sitting on the side of the bed? : A Little Difficulty sitting down on and standing up from a chair with arms (e.g., wheelchair, bedside commode, etc,.)?: None Help needed moving to and  from a bed to chair (including a wheelchair)?: None Help needed walking in hospital room?: None Help needed climbing 3-5 steps with a railing? : None 6 Click Score: 23    End of Session Equipment Utilized During Treatment: Gait belt Activity Tolerance: Patient tolerated treatment well Patient left: in chair;with call bell/phone within reach Nurse Communication: Mobility status PT Visit Diagnosis: Other abnormalities of gait and mobility (R26.89)     Time: [id number] PT Time Calculation (min) (ACUTE ONLY): 28 min  Charges:  $Gait Training: 8-22 mins $Therapeutic Activity: 8-22 mins                    G Codes:       Delaney MeigsMaija Tabor Jeselle Hiser, PT [phone]    Janet Decesare B Anette Barra 09/30/2017, 1:45 PM

## 2017-09-30 NOTE — Discharge Instructions (Addendum)
Call if you see any drainage from the incision. Call if your temperature is greater than 101.5

## 2017-09-30 NOTE — Care Management Note (Signed)
Case Management Note  Patient Details  Name: Paislynn Oquinn MRN: [id number] Date of Birth: 1986-02-08  Subjective/Objective: Pt is a 32 y/o female admitted Hilarie Fredricksonsecondary to purulent drainage from the chiari decompression incision. Pt is s/p posterior cervical wound exploration on 2/26. Pt had initial surgery (suboccipital craniectomy for posterior fossa decompression, dural patch graft, C1 laminectomy) on 2/15.  PTA, pt independent, lives at home with fiance and children.                     Action/Plan: PT recommending OP follow up; OT recommending no OP follow up.  Pt states she has all DME from last admission.  Pt agreeable to OP PT follow up; will place referral in Epic [facility].  Facility will call pt for appointment.   Expected Discharge Date:  09/30/17               Expected Discharge Plan:  OP Rehab  In-House Referral:     Discharge planning Services  CM Consult  Post Acute Care Choice:    Choice offered to:     DME Arranged:    DME Agency:     HH Arranged:    HH Agency:     Status of Service:  Completed, signed off  If discussed at [employer] Length of Stay Meetings, dates discussed:    Additional Comments:  Quintella BatonJulie W. Nyeema Want, RN, BSN  Trauma/Neuro ICU Case Manager [phone]

## 2017-09-30 NOTE — Discharge Summary (Signed)
Physician Discharge Summary  Patient ID: Angela Mosley MRN: [id number] DOB/AGE: [birth year] 32 y.o.  Admit date: 09/21/2017 Discharge date: 09/30/2017  Admission Diagnoses:Csf leak, wound infection  Discharge Diagnoses: same Active Problems:   Chiari malformation type I (HCC)   Wound infection after surgery    Discharged Condition: good  Hospital Course: Mrs. Camino was admitted and taken to the operating room to drain the wound and repair the csf leak. Post op she initially did well, but soon started with persistent drainage from the wound, however there were no signs of infection for the second operation. After the second operation her wound stayed flat, without drainage, and her neck pain improved dramatically. She will finish her course of antibiotics. She is doing well at discharge, ambulating, voiding, and tolerating a regular diet.  Treatments: surgery: Repair of CHIARI CSF LEAK, LUMBAR DRAIN PLACEMENT 09/25/17  Posterior Cervical Wound Exploration 09/21/17   Discharge Exam: Blood pressure 115/80, pulse (!) 101, temperature 98.4 F (36.9 C), temperature source Oral, resp. rate 16, height 5\' 2"  (1.575 m), weight 92.5 kg (204 lb), SpO2 100 %. General appearance: alert, cooperative, appears stated age and no distress Neurologic: Alert and oriented X 3, normal strength and tone. Normal symmetric reflexes. Normal coordination and gait Incision/Wound:clean, flat , dry. No signs of infection  Disposition: 01-Home or Self Care CSF LEAK(Chiari)  Allergies as of 09/30/2017      Reactions   Latex Hives   Latuda [lurasidone Hcl] Hives, Itching, Other (See Comments)   Caused throat to itch badly   Oxycodone-acetaminophen Hives   Pt states the reaction may have been due to her laundry detergent.   Phenergan [promethazine] Other (See Comments)   Caused aggression and rage   Reglan [metoclopramide] Other (See Comments)   Aggressive and angry   Tape Hives   Hydrocodone Hives,  Itching      Medication List    TAKE these medications   acetaminophen 500 MG tablet Commonly known as:  TYLENOL Take 1,000 mg by mouth every 6 (six) hours as needed (for headaches).   acetaZOLAMIDE 250 MG tablet Commonly known as:  DIAMOX Take 250 mg by mouth 3 (three) times daily.   CARAFATE 1 GM/10ML suspension Generic drug:  sucralfate Take 10 mLs (1 g total) by mouth 4 (four) times daily - with meals and at bedtime.   cyclobenzaprine 10 MG tablet Commonly known as:  FLEXERIL Take 10 mg by mouth 3 (three) times daily. What changed:  Another medication with the same name was added. Make sure you understand how and when to take each.   cyclobenzaprine 10 MG tablet Commonly known as:  FLEXERIL Take 1 tablet (10 mg total) by mouth 3 (three) times daily. What changed:  You were already taking a medication with the same name, and this prescription was added. Make sure you understand how and when to take each.   gabapentin 300 MG capsule Commonly known as:  NEURONTIN Take 1 capsule (300 mg total) by mouth 3 (three) times daily.   ibuprofen 200 MG tablet Commonly known as:  ADVIL,MOTRIN Take 200 mg by mouth every 6 (six) hours as needed for fever, headache or mild pain.   linezolid 600 MG tablet Commonly known as:  ZYVOX Take 1 tablet (600 mg total) by mouth every 12 (twelve) hours for 28 days.   ondansetron 4 MG tablet Commonly known as:  ZOFRAN Take 1 tablet (4 mg total) by mouth every 8 (eight) hours as needed for nausea or vomiting.  oxyCODONE 5 MG immediate release tablet Commonly known as:  Oxy IR/ROXICODONE Take 1-2 tablets (5-10 mg total) by mouth every 6 (six) hours as needed for moderate pain.   oxyCODONE-acetaminophen 7.5-325 MG tablet Commonly known as:  PERCOCET Take 1 tablet by mouth every 8 (eight) hours as needed for moderate pain or severe pain (Take only as needed for pain).   pantoprazole 40 MG tablet Commonly known as:  PROTONIX Take 1 tablet (40  mg total) by mouth 2 (two) times daily.   valACYclovir 1000 MG tablet Commonly known as:  VALTREX Take 1 tablet (1,000 mg total) by mouth daily. What changed:    when to take this  reasons to take this      Follow-up Information    Blanchard Kelchixon, Stephanie N, NP Follow up on 10/08/2017.   Specialty:  Infectious Diseases Why:  10:15 appointment. Follow up on antibiotic therapy.  Contact information: [address] [place] [address] [phone]        Coletta Memosabbell, Valdez Brannan, MD Follow up.   Specialty:  Neurosurgery Why:  call to make appointment to have sutures removed next week Contact information: [address] [place] [address] [phone]           Signed: Carmela HurtCABBELL,Brach Birdsall L 09/30/2017, 3:36 PM

## 2017-10-06 IMAGING — CT CT HEAD W/O CM
3 of 6 series · 16 of 47 positions shown, 19 images · non-contrast
Comparison: 05/10/2013

CLINICAL DATA: Frontal headaches radiating down neck. Blurred
vision.

EXAM:
CT HEAD WITHOUT CONTRAST
TECHNIQUE: Contiguous axial images were obtained from the base of the skull
through the vertex without intravenous contrast.

[Series 201: head w/o, idose (1) · axial · non-contrast · 0.49mm/px · z∈[+337,+492]mm · 11 of 37 slices shown, 14 images]
[im 3/37  brain]
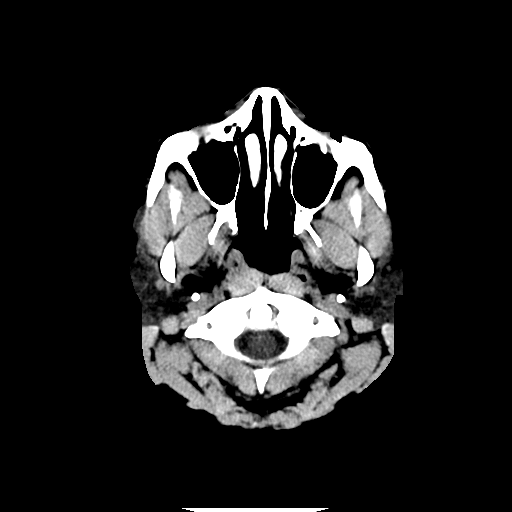
[im 3/37  bone]
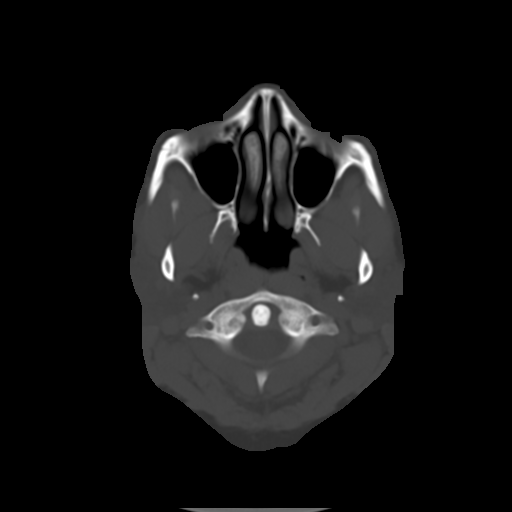
[im 6/37  brain]
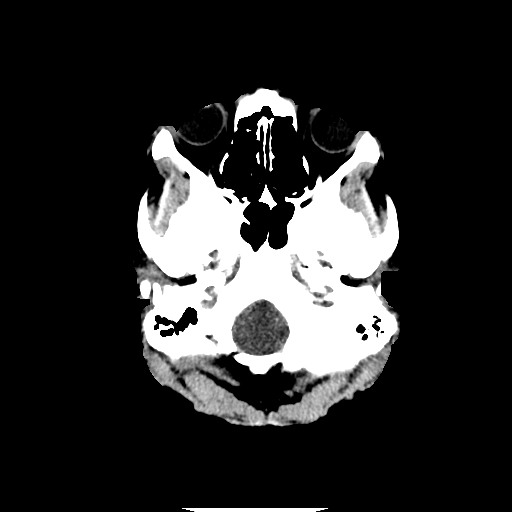
[im 8/37  brain]
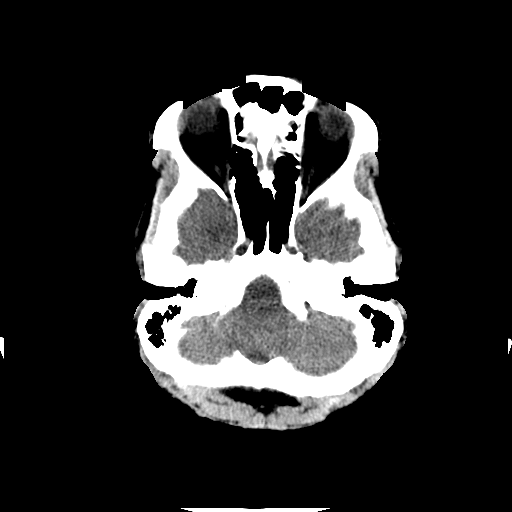
[im 13/37  brain]
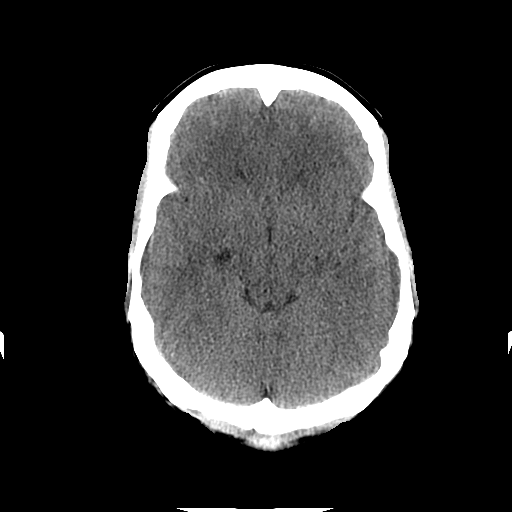
[im 16/37  brain]
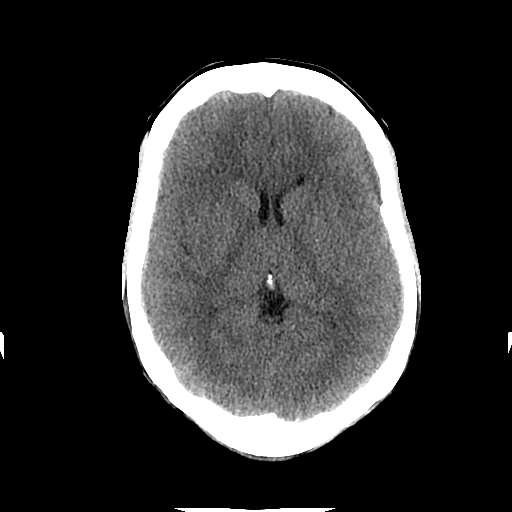
[im 16/37  bone]
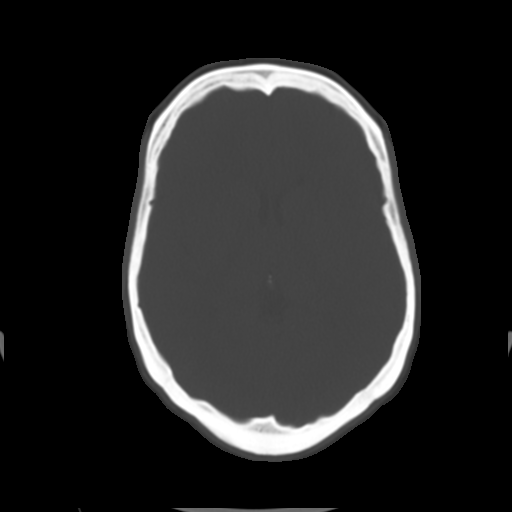
[im 19/37  brain]
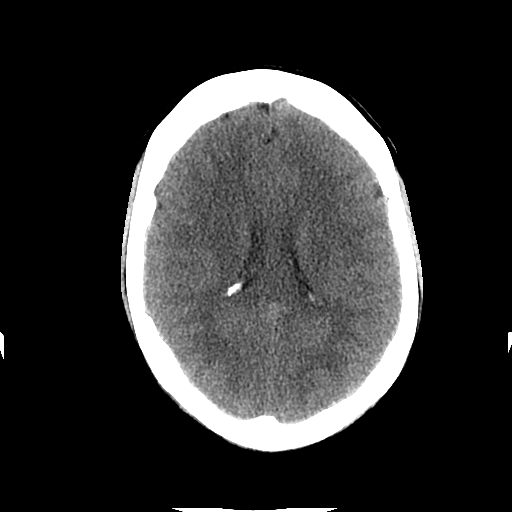
[im 21/37  brain]
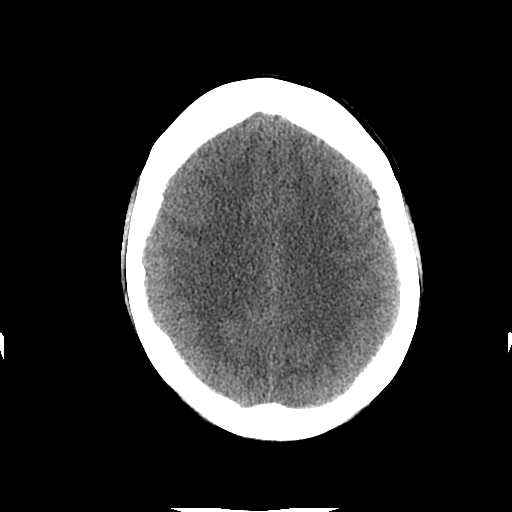
[im 24/37  brain]
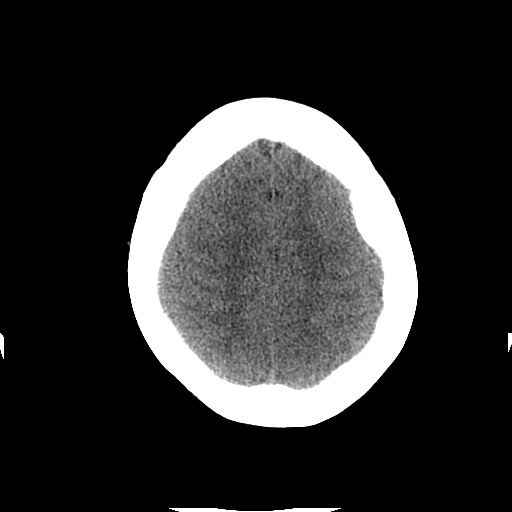
[im 29/37  brain]
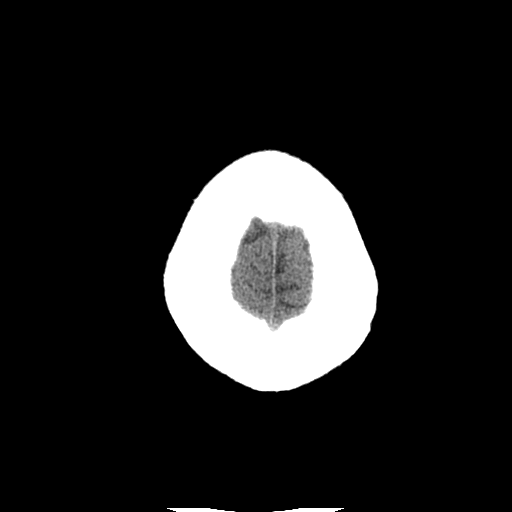
[im 29/37  bone]
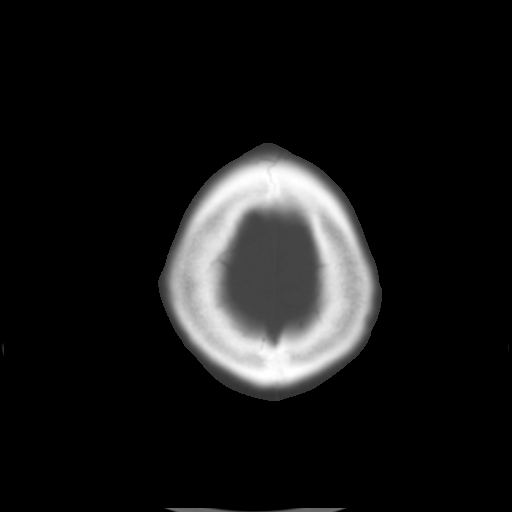
[im 31/37  brain]
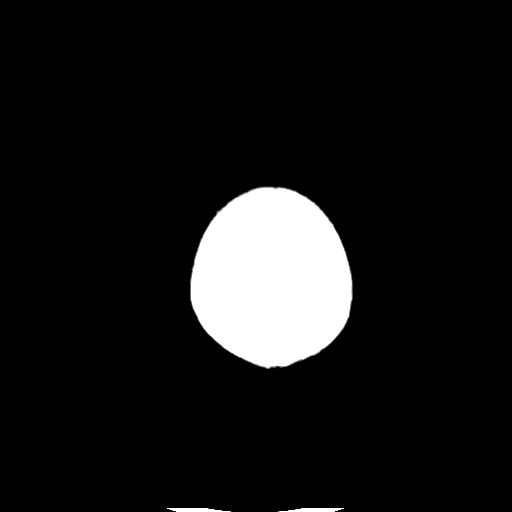
[im 34/37  brain]
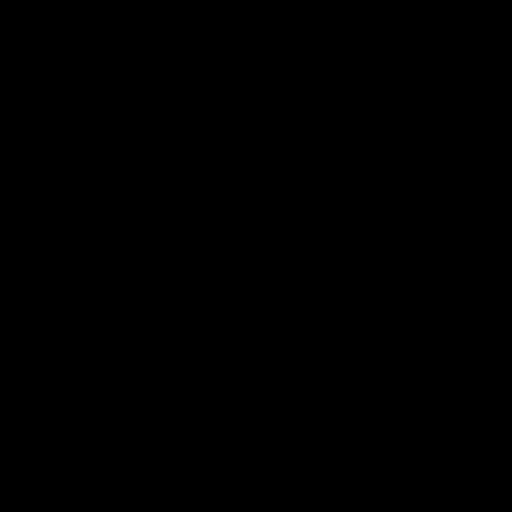

[Series 203: coronal st, idose (1) · coronal · 0.40mm/px · 3 of 75 slices shown]
[im 19/75  brain]
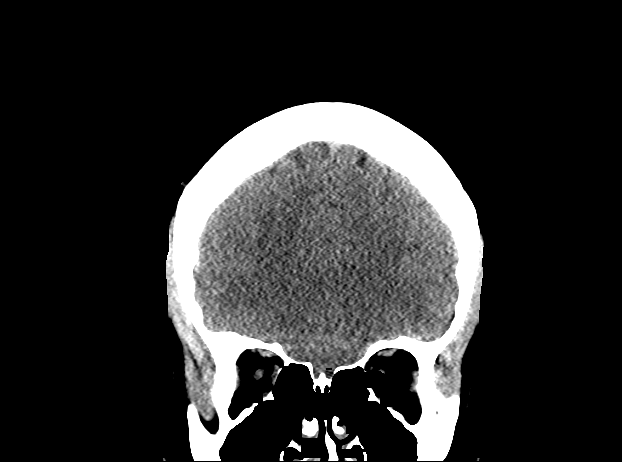
[im 38/75  brain]
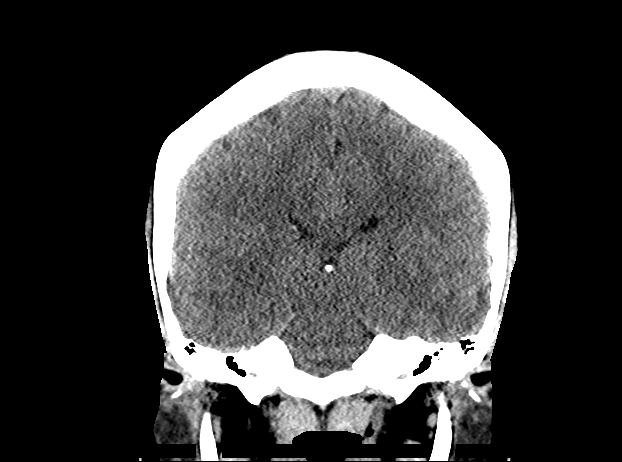
[im 56/75  brain]
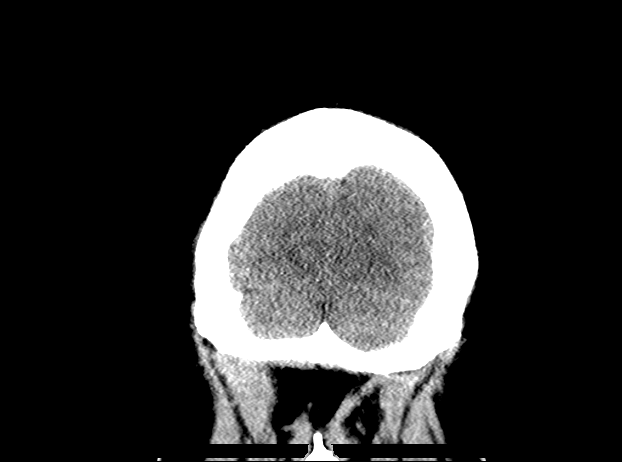

[Series 204: sagittal st, idose (1) · sagittal · 0.40mm/px · 2 of 83 slices shown]
[im 28/83  brain]
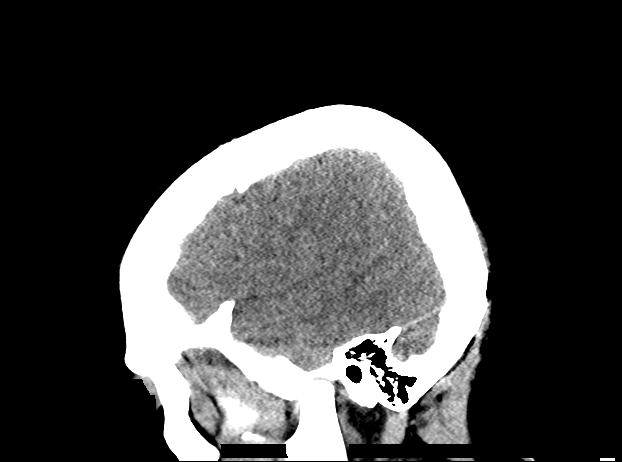
[im 55/83  brain]
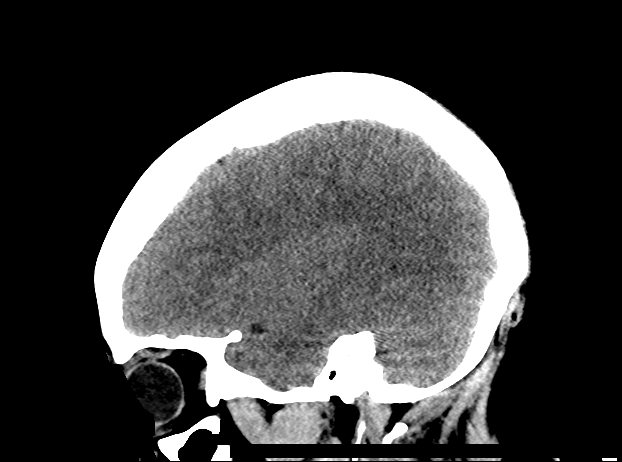

[16 of 47 positions shown; findings below may reference images not displayed]

FINDINGS: Brain: No acute intracranial abnormality. Specifically, no
hemorrhage, hydrocephalus, mass lesion, acute infarction, or
significant intracranial injury.

Vascular: No hyperdense vessel or unexpected calcification.

Skull: No acute calvarial abnormality.

Sinuses/Orbits: Visualized paranasal sinuses and mastoids clear.
Orbital soft tissues unremarkable.

Other: None
IMPRESSION: Normal study.

## 2017-10-08 ENCOUNTER — Ambulatory Visit: Payer: Self-pay | Admitting: Infectious Diseases

## 2017-10-10 ENCOUNTER — Telehealth: Payer: Self-pay | Admitting: Infectious Disease

## 2017-10-10 MED ORDER — DOXYCYCLINE HYCLATE 100 MG PO TABS: 100.0000 mg | ORAL_TABLET | Freq: Two times a day (BID) | ORAL | 2 refills | Status: DC

## 2017-10-10 NOTE — Telephone Encounter (Signed)
Patient called and was having severe abdominal pain that she believes is due to zyvox.  It could alternatively be appendicitis or some other cause of severe pain. She is not having suffidcient # to make me think this is C diff  If she continues to feel poorly I have asked her to come to ED.  She is not to take tonights dose of zyvox  Instead she can take doxycycline which I am calling in for her.  Unfortunately the Coag Neg staph was never worked up for sensis so we will not now with certainty doxy is active.   If she is having SE from zyvox may need to go to IV vancomycin or cubicin or teflaro instead  Can we touch base with her tomorrow and see how she is doing and consider iv vancomycin

## 2017-10-11 ENCOUNTER — Telehealth: Payer: Self-pay | Admitting: *Deleted

## 2017-10-11 ENCOUNTER — Encounter: Payer: Self-pay | Admitting: Infectious Disease

## 2017-10-11 ENCOUNTER — Ambulatory Visit
Admission: RE | Admit: 2017-10-11 | Discharge: 2017-10-11 | Disposition: A | Discharge status: Home / Self Care | Payer: Medicaid Other | Source: Ambulatory Visit | Attending: Infectious Disease | Admitting: Infectious Disease

## 2017-10-11 ENCOUNTER — Ambulatory Visit (INDEPENDENT_AMBULATORY_CARE_PROVIDER_SITE_OTHER): Payer: Medicaid Other | Admitting: Infectious Disease

## 2017-10-11 VITALS — BP 119/88 | HR 108 | Temp 98.7°F | Wt 200.0 lb

## 2017-10-11 DIAGNOSIS — R1084 Generalized abdominal pain: Secondary | ICD-10-CM

## 2017-10-11 DIAGNOSIS — R110 Nausea: Secondary | ICD-10-CM

## 2017-10-11 DIAGNOSIS — G935 Compression of brain: Secondary | ICD-10-CM | POA: Diagnosis not present

## 2017-10-11 DIAGNOSIS — G9600 Cerebrospinal fluid leak, unspecified: Secondary | ICD-10-CM

## 2017-10-11 DIAGNOSIS — T8149XA Infection following a procedure, other surgical site, initial encounter: Secondary | ICD-10-CM | POA: Diagnosis not present

## 2017-10-11 DIAGNOSIS — R109 Unspecified abdominal pain: Secondary | ICD-10-CM

## 2017-10-11 HISTORY — DX: Unspecified abdominal pain: R10.9

## 2017-10-11 LAB — POCT URINE PREGNANCY: PREG TEST UR: NEGATIVE

## 2017-10-11 LAB — CBC WITH DIFFERENTIAL/PLATELET
Basophils Absolute: 0 10*3/uL (ref 0.0–0.1)
Basophils Relative: 0 %
EOS PCT: 1 %
Eosinophils Absolute: 0 10*3/uL (ref 0.0–0.7)
HEMATOCRIT: 36.6 % (ref 36.0–46.0)
Hemoglobin: 11.6 g/dL — ABNORMAL LOW (ref 12.0–15.0)
LYMPHS PCT: 53 %
Lymphs Abs: 2.3 10*3/uL (ref 0.7–4.0)
MCH: 30.4 pg (ref 26.0–34.0)
MCHC: 31.7 g/dL (ref 30.0–36.0)
MCV: 95.8 fL (ref 78.0–100.0)
MONO ABS: 0.3 10*3/uL (ref 0.1–1.0)
MONOS PCT: 6 %
NEUTROS ABS: 1.7 10*3/uL (ref 1.7–7.7)
Neutrophils Relative %: 40 %
PLATELETS: 378 10*3/uL (ref 150–400)
RBC: 3.82 MIL/uL — ABNORMAL LOW (ref 3.87–5.11)
RDW: 12.9 % (ref 11.5–15.5)
WBC: 4.3 10*3/uL (ref 4.0–10.5)

## 2017-10-11 LAB — COMPREHENSIVE METABOLIC PANEL
ALT: 11 U/L — ABNORMAL LOW (ref 14–54)
ANION GAP: 8 (ref 5–15)
AST: 15 U/L (ref 15–41)
Albumin: 4 g/dL (ref 3.5–5.0)
Alkaline Phosphatase: 86 U/L (ref 38–126)
BILIRUBIN TOTAL: 0.3 mg/dL (ref 0.3–1.2)
BUN: 7 mg/dL (ref 6–20)
CO2: 27 mmol/L (ref 22–32)
Calcium: 9.3 mg/dL (ref 8.9–10.3)
Chloride: 104 mmol/L (ref 101–111)
Creatinine, Ser: 0.72 mg/dL (ref 0.44–1.00)
Glucose, Bld: 122 mg/dL — ABNORMAL HIGH (ref 65–99)
POTASSIUM: 4 mmol/L (ref 3.5–5.1)
Sodium: 139 mmol/L (ref 135–145)
TOTAL PROTEIN: 6.6 g/dL (ref 6.5–8.1)

## 2017-10-11 LAB — SEDIMENTATION RATE: Sed Rate: 25 mm/hr — ABNORMAL HIGH (ref 0–22)

## 2017-10-11 MED ORDER — HYOSCYAMINE SULFATE 0.125 MG PO TABS: 0.1250 mg | ORAL_TABLET | ORAL | 0 refills | Status: DC | PRN

## 2017-10-11 NOTE — Telephone Encounter (Signed)
Error

## 2017-10-11 NOTE — Progress Notes (Signed)
Subjective:  Chief complaint: Severe spastic abdominal pain   Patient ID: Angela Mosley, female    DOB: 06-30-1986, 32 y.o.   MRN: [id number]  HPI  32 y.o. female with history of idiopathic intracranial hypertension, Chiari malformation, PTSD, obesity, substance use d/o, bipolar d/o and GERD. She was seen by Dr. Franky Macho for worsening/severe headaches for about 4 weeks. She has been having tinnitus of the right ear for 2 years and was following with Dr. Lucia Gaskins outpatient with [facility]. 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 She was taken for chiari decompression and duraplasty. On 2/26 in follow up with Dr. Franky Macho she reported purulent draiange from the decompression incision. Later that evening she was taken to OR for re-exploration of the wound for infection.   Op Note: initially purulent drainage noted but most drainage appeared to be c/w CSF. The wound was opened to the patch graft and underlying tissues which had good bleeding and no evidence of necrotic tissue. Cultures were obtained and the site was irrigated with 2L saline and vancomycin powder placement prior to wound closure  Cultures yielded a coagulase-negative staphylococcal species which was unfortunately not worked up for sensitivity testing.  She was seen by Rexene Alberts and Dr. Luciana Axe in the inpatient world and our recommendation was for 2 weeks of zyvox.  Ultimately she went back for another surgery to repair CSF leak.  She was discharged in the hospital not just on 2 weeks of Zyvox but actually a whole month of Zyvox.  I received a call yesterday from her stating she was having spastic severe abdominal pain that she believed was due to the Zyvox.  I had her stop this and change her over to doxycycline in the interim.  Her abdominal pain is still  fairly severe and comes in waves it is centered largely in the midline and is spastic it seems to be relieved by defecation to some extent.  Past Medical History:  Diagnosis Date  . Abdominal pain 10/11/2017  . Anxiety   . Depression   . GERD (gastroesophageal reflux disease)   . Headache   . Herpes   . Hyperemesis gravidarum   . PTSD (post-traumatic stress disorder)     Past Surgical History:  Procedure Laterality Date  . CRANIECTOMY N/A 09/25/2017   Procedure: Repair of CHIARI CSF LEAK, LUMBAR DRAIN PLACEMENT;  Surgeon: Coletta Memos, MD;  Location: [facility] OR;  Service: Neurosurgery;  Laterality: N/A;  . MOUTH SURGERY  2005  . SUBOCCIPITAL CRANIECTOMY CERVICAL LAMINECTOMY N/A 09/09/2017   Procedure: CHIARI DECOMPRESSION;  Surgeon: Coletta Memos, MD;  Location: [facility] OR;  Service: Neurosurgery;  Laterality: N/A;  . TUBAL LIGATION  2012  . WOUND EXPLORATION N/A 09/21/2017   Procedure: Posterior Cervical Wound Exploration;  Surgeon: Coletta Memos, MD;  Location: [facility] OR;  Service: Neurosurgery;  Laterality: N/A;    Family History  Problem Relation Age of Onset  . Diabetes Mother   . Hypertension Mother   . Heart attack Mother   . Hyperlipidemia Mother   . Pulmonary embolism Mother   . Cataracts Mother   . Glaucoma Mother   . Heart attack Brother   . Diabetes Maternal Grandmother   . Diabetes Maternal Grandfather   . Heart disease Maternal Grandfather   . Prostate cancer Paternal Grandfather   . Pancreatic cancer Paternal Grandfather   .  Stomach cancer Paternal Grandfather        mets all to back and lung  . Cerebral aneurysm Paternal Aunt       Social History   Socioeconomic History  . Marital status: Single    Spouse name: None  . Number of children: 5  . Years of education: None  . Highest education level: GED or equivalent  Social Needs  . Financial resource strain: None  . Food insecurity - worry: None  . Food insecurity - inability: None  . Transportation needs -  medical: None  . Transportation needs - non-medical: None  Occupational History  . Occupation: Building surveyordaycare teacher  Tobacco Use  . Smoking status: Former Smoker    Packs/day: 0.50    Years: 10.00    Pack years: 5.00    Types: Cigarettes    Last attempt to quit: 03/11/2016    Years since quitting: 1.5  . Smokeless tobacco: Never Used  Substance and Sexual Activity  . Alcohol use: No  . Drug use: Yes    Types: Marijuana  . Sexual activity: Not Currently    Birth control/protection: Surgical  Other Topics Concern  . None  Social History Narrative   Lives at home with fiance & children   Right handed   Drinks 3 sodas daily    Allergies  Allergen Reactions  . Latex Hives  . Latuda [Lurasidone Hcl] Hives, Itching and Other (See Comments)    Caused throat to itch badly  . Oxycodone-Acetaminophen Hives    Pt states the reaction may have been due to her laundry detergent.  . Phenergan [Promethazine] Other (See Comments)    Caused aggression and rage  . Reglan [Metoclopramide] Other (See Comments)    Aggressive and angry  . Tape Hives  . Hydrocodone Hives and Itching     Current Outpatient Medications:  .  CARAFATE 1 GM/10ML suspension, Take 10 mLs (1 g total) by mouth 4 (four) times daily - with meals and at bedtime., Disp: 420 mL, Rfl: 0 .  cyclobenzaprine (FLEXERIL) 10 MG tablet, Take 10 mg by mouth 3 (three) times daily., Disp: , Rfl: 1 .  cyclobenzaprine (FLEXERIL) 10 MG tablet, Take 1 tablet (10 mg total) by mouth 3 (three) times daily., Disp: 60 tablet, Rfl: 0 .  gabapentin (NEURONTIN) 300 MG capsule, Take 1 capsule (300 mg total) by mouth 3 (three) times daily., Disp: 42 capsule, Rfl: 2 .  ibuprofen (ADVIL,MOTRIN) 200 MG tablet, Take 200 mg by mouth every 6 (six) hours as needed for fever, headache or mild pain. , Disp: , Rfl:  .  ondansetron (ZOFRAN) 4 MG tablet, Take 1 tablet (4 mg total) by mouth every 8 (eight) hours as needed for nausea or vomiting., Disp: 30 tablet,  Rfl: 2 .  oxyCODONE (OXY IR/ROXICODONE) 5 MG immediate release tablet, Take 1-2 tablets (5-10 mg total) by mouth every 6 (six) hours as needed for moderate pain., Disp: 45 tablet, Rfl: 0 .  pantoprazole (PROTONIX) 40 MG tablet, Take 1 tablet (40 mg total) by mouth 2 (two) times daily., Disp: 180 tablet, Rfl: 3 .  valACYclovir (VALTREX) 1000 MG tablet, Take 1 tablet (1,000 mg total) by mouth daily. (Patient taking differently: Take 1,000 mg by mouth daily as needed (for flares). ), Disp: 5 tablet, Rfl: 5 .  acetaminophen (TYLENOL) 500 MG tablet, Take 1,000 mg by mouth every 6 (six) hours as needed (for headaches)., Disp: , Rfl:  .  acetaZOLAMIDE (DIAMOX) 250 MG tablet, Take 250  mg by mouth 3 (three) times daily., Disp: , Rfl: 6 .  hyoscyamine (LEVSIN, ANASPAZ) 0.125 MG tablet, Take 1 tablet (0.125 mg total) by mouth every 4 (four) hours as needed., Disp: 60 tablet, Rfl: 0 .  oxyCODONE-acetaminophen (PERCOCET) 7.5-325 MG tablet, Take 1 tablet by mouth every 8 (eight) hours as needed for moderate pain or severe pain (Take only as needed for pain). (Patient not taking: Reported on 10/11/2017), Disp: 14 tablet, Rfl: 0   Review of Systems  Constitutional: Positive for activity change, appetite change and fatigue. Negative for chills, diaphoresis, fever and unexpected weight change.  HENT: Negative for congestion, rhinorrhea, sinus pressure, sneezing, sore throat and trouble swallowing.   Eyes: Negative for photophobia and visual disturbance.  Respiratory: Negative for cough, chest tightness, shortness of breath, wheezing and stridor.   Cardiovascular: Negative for chest pain, palpitations and leg swelling.  Gastrointestinal: Positive for abdominal pain, constipation, diarrhea and nausea. Negative for abdominal distention, anal bleeding and blood in stool.  Genitourinary: Negative for difficulty urinating, dysuria, flank pain and hematuria.  Musculoskeletal: Negative for arthralgias, back pain, gait  problem, joint swelling and myalgias.  Skin: Positive for wound. Negative for color change, pallor and rash.  Neurological: Negative for dizziness, tremors, weakness and light-headedness.  Hematological: Negative for adenopathy. Does not bruise/bleed easily.  Psychiatric/Behavioral: Negative for agitation, behavioral problems, confusion, decreased concentration, dysphoric mood and sleep disturbance.       Objective:   Physical Exam  Constitutional: She is oriented to person, place, and time. She appears well-developed and well-nourished. No distress.  HENT:  Head: Normocephalic and atraumatic.  Mouth/Throat: No oropharyngeal exudate.  Eyes: Conjunctivae and EOM are normal. No scleral icterus.  Neck: Normal range of motion. Neck supple.  Cardiovascular: Normal rate, regular rhythm and normal heart sounds. Exam reveals no gallop and no friction rub.  No murmur heard. Pulmonary/Chest: Effort normal and breath sounds normal. No respiratory distress. She has no rales.  Abdominal: Soft. Normal appearance. There is tenderness in the epigastric area, periumbilical area and suprapubic area. There is no rigidity, no rebound and no guarding.  Musculoskeletal: She exhibits no edema or tenderness.  Neurological: She is alert and oriented to person, place, and time. She exhibits normal muscle tone. Coordination normal.  Skin: Skin is warm and dry. No rash noted. She is not diaphoretic. No erythema. No pallor.  Psychiatric: She has a normal mood and affect. Her behavior is normal. Judgment and thought content normal.  Nursing note and vitals reviewed.  Wound is healing well see picture 10/11/2017:             Assessment & Plan:   Severe spastic abdominal pain: I doubt this has any to do with her Zyvox since it is not improved at all after discontinuing it.  I will check a CMP lipase amylase lactic acid and CBC with differential I am also checking plain films.  I will send in a prescription  for Levsin  She does have a gastroenterologist who follows her chronically and I would recommend that she be seen by GI if possible.  Certainly if her symptoms continue to worsen she will need to go to the emergency department.  Wound infection: this was also with a CSF leak. She has had well over 2 weeks of the bioavailable antibiotics in the inpatient world in the form of IV vancomycin and then in the form of Zyvox.  I actually like her to hold all antibiotics for now since we had never  initially planned on treating her so long and since they may obfuscated what is going on with her abdominal picture.  I will plan on having her come back to clinic to see [place] again in 1 month's time.  I spent greater than 25 minutes with the patient including greater than 50% of time in face to face counsel of the patient and her fianc with regards to workup of her severe abdominal pain, need for further labs imaging, limitations that we have here in our clinic and need potentially for her to go to emergency department later and in coordination of her care.

## 2017-10-11 NOTE — Telephone Encounter (Signed)
Patient called in today per her conversation with Dr Daiva EvesVan Dam this weekend. She is feeling better but advised he wants change her medication so she wants to be seen today. Gave her an appt to see him and asked her about the missed appt 10/08/17. She advised she was not aware of the appt and apologized for the no show.

## 2017-10-12 ENCOUNTER — Encounter: Payer: Self-pay | Admitting: Neurology

## 2017-10-12 ENCOUNTER — Ambulatory Visit: Payer: Medicaid Other | Admitting: Neurology

## 2017-10-12 VITALS — BP 120/80 | HR 101 | Ht 62.0 in | Wt 200.0 lb

## 2017-10-12 DIAGNOSIS — G935 Compression of brain: Secondary | ICD-10-CM | POA: Diagnosis not present

## 2017-10-12 LAB — LIPASE: LIPASE: 9 U/L (ref 7–60)

## 2017-10-12 LAB — C-REACTIVE PROTEIN: CRP: 4.6 mg/L (ref <8.0)

## 2017-10-12 LAB — AMYLASE: Amylase: 42 U/L (ref 21–101)

## 2017-10-12 NOTE — Progress Notes (Signed)
[facility]    Provider:  Dr Lucia GaskinsAhern Referring Provider: Freddrick MarchAmin, Yashika, MD Primary Care Physician:  Freddrick MarchAmin, Yashika, MD  CC:  Fluid/pressure on my brain  Interval history 10/12/2017: She is post op craniectomy for chiari malformation. She is here with ehr husband who provides information as well. Discussed Chiari malformation, reviewed images again. She is doing well. No headache in a week. Vision is improving. Sheis doing well. She is having stomach pains, she is on an antibiotic. Her whole belly hurts. She is constipated. Reviewed XR images of the abdomen showing constipation which may be causing her stomach discomfort, advised to follow up with pcp or provider who had the xr completed. Constipation due to opiods, try stool softener and may [facility] as they see clinically appropriate.    HPI:  Angela Mosley is a 32 y.o. female here as a referral from Dr. Nelson ChimesAmin for pulsatile tinnitus.  Past medical history of depression and anxiety. Symptoms started 2 years ago, pulsing noise, if she puts pressure on the right upper neck it stops, continuous. It has gotten louder and more annoying. She also has severe headaches, they though they were tension headaches, she has been to the hospital, she has had nausea and vomiibt, all over and severe pressure. The headaches come and go, severe, unknown triggers. She has episodes where her eyes get blurry, she has to come back into focus with her eyes. No other focal neurologic deficits, associated symptoms, inciting events or modifiable factors.   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 This constellation of findings is most commonly associated with headache, but can also result in pulsatile tinnitus.  This is a 2-year history of pulsating sound in the right ear, makes it hard to hear, unable to use the telephone, when is not pulsating she can hear just fine.  Resting the right upper neck area makes noise go away.  Also turning her head to the right has the same effect.  She has no symptoms related to the left ear.  No change in her weight.  Otherwise in good health.  Exam was essentially normal, diagnosed with a probable venous hum, MRI and MRA were apparently ordered by ear nose and throat.   Review of Systems: Patient complains of symptoms per HPI as well as the following symptoms: Hearing loss, ringing the ears, headache. Pertinent negatives and positives per HPI. All others negative.   Social History   Socioeconomic History  . Marital status: Single    Spouse name: Not on file  . Number of children: 5  . Years of education: Not on file  . Highest education level: GED or equivalent  Social Needs  . Financial resource strain: Not on file  . Food insecurity - worry: Not on file  . Food insecurity - inability: Not on file  . Transportation needs - medical: Not on file  . Transportation needs - non-medical: Not on file  Occupational History  . Occupation: stay at home mom  Tobacco Use  . Smoking status: Former Smoker    Packs/day: 0.50    Years: 10.00    Pack years: 5.00    Types: Cigarettes    Last attempt  to quit: 03/11/2016    Years since quitting: 1.5  . Smokeless tobacco: Never Used  Substance and Sexual Activity  . Alcohol use: No  . Drug use: No    Comment: none since 09/08/17  . Sexual activity: Not Currently    Birth control/protection: Surgical  Other Topics Concern  . Not on file  Social History Narrative   Lives at home with fiance & children   Right handed   Drinks 3 sodas daily    Family History  Problem Relation  Age of Onset  . Diabetes Mother   . Hypertension Mother   . Heart attack Mother   . Hyperlipidemia Mother   . Pulmonary embolism Mother   . Cataracts Mother   . Glaucoma Mother   . Heart attack Brother   . Diabetes Maternal Grandmother   . Diabetes Maternal Grandfather   . Heart disease Maternal Grandfather   . Prostate cancer Paternal Grandfather   . Pancreatic cancer Paternal Grandfather   . Stomach cancer Paternal Grandfather        mets all to back and lung  . Cerebral aneurysm Paternal Aunt     Past Medical History:  Diagnosis Date  . Abdominal pain 10/11/2017  . Anxiety   . Depression   . GERD (gastroesophageal reflux disease)   . Headache   . Herpes   . Hyperemesis gravidarum   . PTSD (post-traumatic stress disorder)   . Staph infection 08/2017   surgical site, post neck    Past Surgical History:  Procedure Laterality Date  . CRANIECTOMY N/A 09/25/2017   Procedure: Repair of CHIARI CSF LEAK, LUMBAR DRAIN PLACEMENT;  Surgeon: Coletta Memos, MD;  Location: [facility] OR;  Service: Neurosurgery;  Laterality: N/A;  . MOUTH SURGERY  2005  . SUBOCCIPITAL CRANIECTOMY CERVICAL LAMINECTOMY N/A 09/09/2017   Procedure: CHIARI DECOMPRESSION;  Surgeon: Coletta Memos, MD;  Location[facility];  Service: Neurosurgery;  Laterality: N/A;  . TUBAL LIGATION  2012  . WOUND EXPLORATION N/A 09/21/2017   Procedure: Posterior Cervical Wound Exploration;  Surgeon: Coletta Memos, MD;  Location: [facility] OR;  Service: Neurosurgery;  Laterality: N/A;    Current Outpatient Medications  Medication Sig Dispense Refill  . CARAFATE 1 GM/10ML suspension Take 10 mLs (1 g total) by mouth 4 (four) times daily - with meals and at bedtime. 420 mL 0  . cyclobenzaprine (FLEXERIL) 10 MG tablet Take 1 tablet (10 mg total) by mouth 3 (three) times daily. (Patient taking differently: Take 10 mg by mouth at bedtime as needed. ) 60 tablet 0  . ibuprofen (ADVIL,MOTRIN) 200 MG tablet Take 200 mg by mouth every 6 (six) hours as  needed for fever, headache or mild pain.     Marland Kitchen ondansetron (ZOFRAN) 4 MG tablet Take 1 tablet (4 mg total) by mouth every 8 (eight) hours as needed for nausea or vomiting. 30 tablet 2  . oxyCODONE (OXY IR/ROXICODONE) 5 MG immediate release tablet Take 1-2 tablets (5-10 mg total) by mouth every 6 (six) hours as needed for moderate pain. 45 tablet 0  . pantoprazole (PROTONIX) 40 MG tablet Take 1 tablet (40 mg total) by mouth 2 (two) times daily. 180 tablet 3  . valACYclovir (VALTREX) 1000 MG tablet Take 1 tablet (1,000 mg total) by mouth daily. (Patient taking differently: Take 1,000 mg by mouth daily as needed (for flares). ) 5 tablet 5  . acetaZOLAMIDE (DIAMOX) 250 MG tablet Take 250 mg by mouth 3 (three) times daily.  6   No  current facility-administered medications for this visit.     Allergies as of 10/12/2017 - Review Complete 10/12/2017  Allergen Reaction Noted  . Latex Hives 06/02/2011  . Latuda [lurasidone hcl] Hives, Itching, and Other (See Comments) 07/21/2017  . Oxycodone-acetaminophen Hives 09/07/2017  . Phenergan [promethazine] Other (See Comments) 08/29/2017  . Reglan [metoclopramide] Other (See Comments) 08/29/2017  . Tape Hives 06/02/2011  . Hydrocodone Hives and Itching 06/02/2011    Vitals: BP 120/80 (BP Location: Right Arm, Patient Position: Sitting)   Pulse (!) 101   Ht 5\' 2"  (1.575 m)   Wt 200 lb (90.7 kg)   LMP 10/04/2017   BMI 36.58 kg/m  Last Weight:  Wt Readings from Last 1 Encounters:  10/12/17 200 lb (90.7 kg)   Last Height:   Ht Readings from Last 1 Encounters:  10/12/17 5\' 2"  (1.575 m)     Physical exam: Exam: Gen: NAD, conversant, well nourised, obese, well groomed                     CV: RRR, no MRG. No Carotid Bruits. No peripheral edema, warm, nontender Eyes: Conjunctivae clear without exudates or hemorrhage  Neuro: Detailed Neurologic Exam  Speech:    Speech is normal; fluent and spontaneous with normal comprehension.  Cognition:     The patient is oriented to person, place, and time;     recent and remote memory intact;     language fluent;     normal attention, concentration,     fund of knowledge Cranial Nerves:    The pupils are equal, round, and reactive to light. The fundi are normal and spontaneous venous pulsations are present. Visual fields are full to finger confrontation. Extraocular movements are intact. Trigeminal sensation is intact and the muscles of mastication are normal. The face is symmetric. The palate elevates in the midline. Hearing intact. Voice is normal. Shoulder shrug is normal. The tongue has normal motion without fasciculations.   Coordination:    Normal finger to nose and heel to shin. Normal rapid alternating movements.   Gait:    Heel-toe and tandem gait are normal.   Motor Observation:    No asymmetry, no atrophy, and no involuntary movements noted. Tone:    Normal muscle tone.    Posture:    Posture is normal. normal erect    Strength:    Strength is V/V in the upper and lower limbs.      Sensation: intact to LT     Reflex Exam:  DTR's:    Deep tendon reflexes in the upper and lower extremities are normal bilaterally.   Toes:    The toes are downgoing bilaterally.   Clonus:    Clonus is absent.     Assessment/Plan: 32 year old female, obese BMI 38, with pulsatile tinnitus, headaches, vision changes.  MRI of the brain was consistent with intracranial hypertension with an empty sella and flattening of the posterior globes, also seen Chiari malformation with pegged appearance, questionable stenosis transverse sinus.  - s/p occipital craniotomy doing well - Reviewed XR images of the abdomen showing constipation which may be causing her stomach discomfort, advised to follow up with pcp or provider who had the xr completed. Constipation due to opiods, try stool softener and may see provider for [facility] as they see clinically appropriate.     Discussed: To prevent or relieve  headaches, try the following: Cool Compress. Lie down and place a cool compress on your head.  Avoid headache triggers.  If certain foods or odors seem to have triggered your migraines in the past, avoid them. A headache diary might help you identify triggers.  Include physical activity in your daily routine. Try a daily walk or other moderate aerobic exercise.  Manage stress. Find healthy ways to cope with the stressors, such as delegating tasks on your to-do list.  Practice relaxation techniques. Try deep breathing, yoga, massage and visualization.  Eat regularly. Eating regularly scheduled meals and maintaining a healthy diet might help prevent headaches. Also, drink plenty of fluids.  Follow a regular sleep schedule. Sleep deprivation might contribute to headaches Consider biofeedback. With this mind-body technique, you learn to control certain bodily functions - such as muscle tension, heart rate and blood pressure - to prevent headaches or reduce headache pain.    Proceed to emergency room if you experience new or worsening symptoms or symptoms do not resolve, if you have new neurologic symptoms or if headache is severe, or for any concerning symptom.   Naomie Dean, MD  [facility] [address] [place], [place] [address]  Phone [phone] Fax [phone]  A total of 25 minutes was spent in with this patient face-to-face. Over half this time was spent on counseling patient on the chiari malformation, constipation diagnosis and different management options available.

## 2017-10-13 LAB — LACTIC ACID, PLASMA: LACTIC ACID: 1.2 mmol/L (ref 0.4–1.8)

## 2017-10-25 ENCOUNTER — Telehealth: Payer: Self-pay | Admitting: Neurology

## 2017-10-25 ENCOUNTER — Ambulatory Visit: Payer: Medicaid Other | Admitting: Neurology

## 2017-10-25 ENCOUNTER — Encounter: Payer: Self-pay | Admitting: Neurology

## 2017-10-25 VITALS — BP 123/81 | HR 107 | Ht 62.0 in | Wt 202.0 lb

## 2017-10-25 DIAGNOSIS — G43011 Migraine without aura, intractable, with status migrainosus: Secondary | ICD-10-CM | POA: Diagnosis not present

## 2017-10-25 MED ORDER — RIZATRIPTAN BENZOATE 10 MG PO TBDP: 10.0000 mg | ORAL_TABLET | ORAL | 11 refills | Status: AC | PRN

## 2017-10-25 NOTE — Telephone Encounter (Signed)
Pt scheduled for 10/25/2017 @ 1:00 pm.

## 2017-10-25 NOTE — Patient Instructions (Signed)
Maxalt: Please take one tablet at the onset of your headache. If it does not improve the symptoms please take one additional tablet in 2 hours. Do not take more then 2 tablets in 24hrs. Do not take use more then 2 to 3 times in a week.  Rizatriptan tablets What is this medicine? RIZATRIPTAN (rye za TRIP tan) is used to treat migraines with or without aura. An aura is a strange feeling or visual disturbance that warns you of an attack. It is not used to prevent migraines. This medicine may be used for other purposes; ask your health care provider or pharmacist if you have questions. COMMON BRAND NAME(S): Maxalt What should I tell my health care provider before I take this medicine? They need to know if you have any of these conditions: -bowel disease or colitis -diabetes -family history of heart disease -fast or irregular heart beat -heart or blood vessel disease, angina (chest pain), or previous heart attack -high blood pressure -high cholesterol -history of stroke, transient ischemic attacks (TIAs or mini-strokes), or intracranial bleeding -kidney or liver disease -overweight -poor circulation -postmenopausal or surgical removal of uterus and ovaries -Raynaud's disease -seizure disorder -an unusual or allergic reaction to rizatriptan, other medicines, foods, dyes, or preservatives -pregnant or trying to get pregnant -breast-feeding How should I use this medicine? This medicine is taken by mouth with a glass of water. Follow the directions on the prescription label. This medicine is taken at the first symptoms of a migraine. It is not for everyday use. If your migraine headache returns after one dose, you can take another dose as directed. You must leave at least 2 hours between doses, and do not take more than 30 mg total in 24 hours. If there is no improvement at all after the first dose, do not take a second dose without talking to your doctor or health care professional. Do not take  your medicine more often than directed. Talk to your pediatrician regarding the use of this medicine in children. While this drug may be prescribed for children as young as 6 years for selected conditions, precautions do apply. Overdosage: If you think you have taken too much of this medicine contact a poison control center or emergency room at once. NOTE: This medicine is only for you. Do not share this medicine with others. What if I miss a dose? This does not apply; this medicine is not for regular use. What may interact with this medicine? Do not take this medicine with any of the following medicines: -amphetamine, dextroamphetamine or cocaine -dihydroergotamine, ergotamine, ergoloid mesylates, methysergide, or ergot-type medication - do not take within 24 hours of taking rizatriptan -feverfew -MAOIs like Carbex, Eldepryl, Marplan, Nardil, and Parnate - do not take rizatriptan within 2 weeks of stopping MAOI therapy. -other migraine medicines like almotriptan, eletriptan, naratriptan, sumatriptan, zolmitriptan - do not take within 24 hours of taking rizatriptan -tryptophan This medicine may also interact with the following medications: -medicines for mental depression, anxiety or mood problems -propranolol This list may not describe all possible interactions. Give your health care provider a list of all the medicines, herbs, non-prescription drugs, or dietary supplements you use. Also tell them if you smoke, drink alcohol, or use illegal drugs. Some items may interact with your medicine. What should I watch for while using this medicine? Only take this medicine for a migraine headache. Take it if you get warning symptoms or at the start of a migraine attack. It is not for regular use  to prevent migraine attacks. You may get drowsy or dizzy. Do not drive, use machinery, or do anything that needs mental alertness until you know how this medicine affects you. To reduce dizzy or fainting spells,  do not sit or stand up quickly, especially if you are an older patient. Alcohol can increase drowsiness, dizziness and flushing. Avoid alcoholic drinks. Smoking cigarettes may increase the risk of heart-related side effects from using this medicine. If you take migraine medicines for 10 or more days a month, your migraines may get worse. Keep a diary of headache days and medicine use. Contact your healthcare professional if your migraine attacks occur more frequently. What side effects may I notice from receiving this medicine? Side effects that you should report to your doctor or health care professional as soon as possible: -allergic reactions like skin rash, itching or hives, swelling of the face, lips, or tongue -fast, slow, or irregular heart beat -increased or decreased blood pressure -seizures -severe stomach pain and cramping, bloody diarrhea -signs and symptoms of a blood clot such as breathing problems; changes in vision; chest pain; severe, sudden headache; pain, swelling, warmth in the leg; trouble speaking; sudden numbness or weakness of the face, arm or leg -tingling, pain, or numbness in the face, hands, or feet Side effects that usually do not require medical attention (report to your doctor or health care professional if they continue or are bothersome): -drowsiness -dry mouth -feeling warm, flushing, or redness of the face -headache -muscle cramps, pain -nausea, vomiting -unusually weak or tired This list may not describe all possible side effects. Call your doctor for medical advice about side effects. You may report side effects to FDA at 1-800-FDA-1088. Where should I keep my medicine? Keep out of the reach of children. Store at room temperature between 15 and 30 degrees C (59 and 86 degrees F). Keep container tightly closed. Throw away any unused medicine after the expiration date. NOTE: This sheet is a summary. It may not cover all possible information. If you have  questions about this medicine, talk to your doctor, pharmacist, or health care provider.  2018 Elsevier/Gold Standard (2013-03-14 10:16:39)

## 2017-10-25 NOTE — Progress Notes (Signed)
[facility]    Provider:  Dr Lucia GaskinsAhern Referring Provider: Freddrick MarchAmin, Yashika, MD Primary Care Physician:  Freddrick MarchAmin, Yashika, MD  CC:  Fluid/pressure on my brain  Interval history: Headache started last Friday, started on the right, pounding/throbbing, her eyes and teeth hurt. Neck is hurting. When she lays down her head pulsates. Even in her ear. She is in severe pain. Continuous since Friday. She has nausea, no vomited. She took Zofran, alleve didn't help. Flexeril didn't help. Discussed acute management of migraines. Will try a migraine cocktail. Will try maxalt acutely at next migraine.  Interval history 10/12/2017: She is post op craniectomy for chiari malformation. She is here with ehr husband who provides information as well. Discussed Chiari malformation, reviewed images again. She is doing well. No headache in a week. Vision is improving. Sheis doing well. She is having stomach pains, she is on an antibiotic. Her whole belly hurts. She is constipated. Reviewed XR images of the abdomen showing constipation which may be causing her stomach discomfort, advised to follow up with pcp or provider who had the xr completed. Constipation due to opiods, try stool softener and [facility].    HPI:  Angela Mosley is a 32 y.o. female here as a referral from Dr. Nelson ChimesAmin for pulsatile tinnitus.  Past medical history of depression and anxiety. Symptoms started 2 years ago, pulsing noise, if she puts pressure on the right upper neck it stops, continuous. It has gotten louder and more annoying. She also has severe headaches, they though they were tension headaches, she has been to the hospital, she has had nausea and vomiibt, all over and severe pressure. The headaches come and go, severe, unknown triggers. She has episodes where her eyes get blurry, she has to come back into focus with her eyes. No other focal neurologic deficits, associated  symptoms, inciting events or modifiable factors.   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 This constellation of findings is most commonly associated with headache, but can also result in pulsatile tinnitus.  This is a 2-year history of pulsating sound in the right ear, makes it hard to hear, unable to use the telephone, when is not pulsating she can hear just fine.  Resting the right upper neck area makes noise go away.  Also turning her head to the right has the same effect.  She has no symptoms related to the left ear.  No change in her weight.  Otherwise in good health.  Exam was essentially normal, diagnosed with a probable venous hum, MRI and MRA were apparently ordered by ear nose and throat.   Review of Systems: Patient complains of symptoms per HPI as well as the following symptoms: Hearing loss, ringing the ears, headache. Pertinent negatives and positives per HPI. All others negative.   Social History   Socioeconomic History  . Marital status: Single    Spouse name: Not on file  . Number of children: 5  . Years of education: Not on file  . Highest education level: GED or equivalent  Occupational History  . Occupation: stay at home mom  Social Needs  . Financial resource strain: Not on file  .  Food insecurity:    Worry: Not on file    Inability: Not on file  . Transportation needs:    Medical: Not on file    Non-medical: Not on file  Tobacco Use  . Smoking status: Former Smoker    Packs/day: 0.50    Years: 10.00    Pack years: 5.00    Types: Cigarettes    Last attempt to quit: 03/11/2016    Years since  quitting: 1.6  . Smokeless tobacco: Never Used  Substance and Sexual Activity  . Alcohol use: No  . Drug use: No    Types: Marijuana    Comment: none since 09/08/17  . Sexual activity: Not Currently    Birth control/protection: Surgical  Lifestyle  . Physical activity:    Days per week: Not on file    Minutes per session: Not on file  . Stress: Not on file  Relationships  . Social connections:    Talks on phone: Not on file    Gets together: Not on file    Attends religious service: Not on file    Active member of club or organization: Not on file    Attends meetings of clubs or organizations: Not on file    Relationship status: Not on file  . Intimate partner violence:    Fear of current or ex partner: Not on file    Emotionally abused: Not on file    Physically abused: Not on file    Forced sexual activity: Not on file  Other Topics Concern  . Not on file  Social History Narrative   Lives at home with fiance & children   Right handed   Drinks 3 non-caffeineated sodas daily    Family History  Problem Relation Age of Onset  . Diabetes Mother   . Hypertension Mother   . Heart attack Mother   . Hyperlipidemia Mother   . Pulmonary embolism Mother   . Cataracts Mother   . Glaucoma Mother   . Heart attack Brother   . Diabetes Maternal Grandmother   . Diabetes Maternal Grandfather   . Heart disease Maternal Grandfather   . Prostate cancer Paternal Grandfather   . Pancreatic cancer Paternal Grandfather   . Stomach cancer Paternal Grandfather        mets all to back and lung  . Cerebral aneurysm Paternal Aunt     Past Medical History:  Diagnosis Date  . Abdominal pain 10/11/2017  . Anxiety   . Depression   . GERD (gastroesophageal reflux disease)   . Headache   . Herpes   . Hyperemesis gravidarum   . PTSD (post-traumatic stress disorder)   . Staph infection 08/2017   surgical site, post neck    Past Surgical History:  Procedure Laterality Date  .  CRANIECTOMY N/A 09/25/2017   Procedure: Repair of CHIARI CSF LEAK, LUMBAR DRAIN PLACEMENT;  Surgeon: Coletta Memos, MD;  Location: [facility] OR;  Service: Neurosurgery;  Laterality: N/A;  . MOUTH SURGERY  2005  . SUBOCCIPITAL CRANIECTOMY CERVICAL LAMINECTOMY N/A 09/09/2017   Procedure: CHIARI DECOMPRESSION;  Surgeon: Coletta Memos, MD;  Location: [facility] OR;  Service: Neurosurgery;  Laterality: N/A;  . TUBAL LIGATION  2012  . WOUND EXPLORATION N/A 09/21/2017   Procedure: Posterior Cervical Wound Exploration;  Surgeon: Coletta Memos, MD;  Location: [facility] OR;  Service: Neurosurgery;  Laterality: N/A;    Current Outpatient Medications  Medication Sig Dispense Refill  . CARAFATE 1 GM/10ML suspension Take 10 mLs (1 g total)  by mouth 4 (four) times daily - with meals and at bedtime. 420 mL 0  . cyclobenzaprine (FLEXERIL) 10 MG tablet Take 1 tablet (10 mg total) by mouth 3 (three) times daily. (Patient taking differently: Take 10 mg by mouth at bedtime as needed. ) 60 tablet 0  . ibuprofen (ADVIL,MOTRIN) 200 MG tablet Take 200 mg by mouth every 6 (six) hours as needed for fever, headache or mild pain.     Marland Kitchen ondansetron (ZOFRAN) 4 MG tablet Take 1 tablet (4 mg total) by mouth every 8 (eight) hours as needed for nausea or vomiting. 30 tablet 2  . oxyCODONE (OXY IR/ROXICODONE) 5 MG immediate release tablet Take 1-2 tablets (5-10 mg total) by mouth every 6 (six) hours as needed for moderate pain. 45 tablet 0  . pantoprazole (PROTONIX) 40 MG tablet Take 1 tablet (40 mg total) by mouth 2 (two) times daily. 180 tablet 3  . valACYclovir (VALTREX) 1000 MG tablet Take 1 tablet (1,000 mg total) by mouth daily. (Patient taking differently: Take 1,000 mg by mouth daily as needed (for flares). ) 5 tablet 5  . rizatriptan (MAXALT-MLT) 10 MG disintegrating tablet Take 1 tablet (10 mg total) by mouth as needed for migraine. May repeat in 2 hours if needed. Maximum 2 in one day. 9 tablet 11   No current facility-administered medications  for this visit.     Allergies as of 10/25/2017 - Review Complete 10/25/2017  Allergen Reaction Noted  . Latex Hives 06/02/2011  . Latuda [lurasidone hcl] Hives, Itching, and Other (See Comments) 07/21/2017  . Oxycodone-acetaminophen Hives 09/07/2017  . Phenergan [promethazine] Other (See Comments) 08/29/2017  . Reglan [metoclopramide] Other (See Comments) 08/29/2017  . Tape Hives 06/02/2011  . Hydrocodone Hives and Itching 06/02/2011    Vitals: BP 123/81 (BP Location: Right Arm, Patient Position: Sitting)   Pulse (!) 107   Ht 5\' 2"  (1.575 m)   Wt 202 lb (91.6 kg)   LMP 10/04/2017   BMI 36.95 kg/m  Last Weight:  Wt Readings from Last 1 Encounters:  10/25/17 202 lb (91.6 kg)   Last Height:   Ht Readings from Last 1 Encounters:  10/25/17 5\' 2"  (1.575 m)     Physical exam: Exam: Gen: NAD, conversant, well nourised, obese, well groomed                     CV: RRR, no MRG. No Carotid Bruits. No peripheral edema, warm, nontender Eyes: Conjunctivae clear without exudates or hemorrhage  Neuro: Detailed Neurologic Exam  Speech:    Speech is normal; fluent and spontaneous with normal comprehension.  Cognition:    The patient is oriented to person, place, and time;     recent and remote memory intact;     language fluent;     normal attention, concentration,     fund of knowledge Cranial Nerves:    The pupils are equal, round, and reactive to light. The fundi are normal and spontaneous venous pulsations are present. Visual fields are full to finger confrontation. Extraocular movements are intact. Trigeminal sensation is intact and the muscles of mastication are normal. The face is symmetric. The palate elevates in the midline. Hearing intact. Voice is normal. Shoulder shrug is normal. The tongue has normal motion without fasciculations.   Coordination:    Normal finger to nose and heel to shin. Normal rapid alternating movements.   Gait:    Heel-toe and tandem gait are  normal.   Motor Observation:  No asymmetry, no atrophy, and no involuntary movements noted. Tone:    Normal muscle tone.    Posture:    Posture is normal. normal erect    Strength:    Strength is V/V in the upper and lower limbs.      Sensation: intact to LT     Reflex Exam:  DTR's:    Deep tendon reflexes in the upper and lower extremities are normal bilaterally.   Toes:    The toes are downgoing bilaterally.   Clonus:    Clonus is absent.     Assessment/Plan: 32 year old female, obese BMI 38, with pulsatile tinnitus, headaches, vision changes.  MRI of the brain was consistent with intracranial hypertension with an empty sella and flattening of the posterior globes, also seen Chiari malformation with pegged appearance, questionable stenosis transverse sinus.  - s/p occipital craniotomy doing well - Reviewed XR images of the abdomen showing constipation which may be causing her stomach discomfort, advised to follow up with pcp or provider who had the xr completed. Constipation due to opiods, try stool softener and may see provider for [facility] as they see clinically appropriate.   - Today with migraine. Will treat - acute management: Maxalt: Please take one tablet at the onset of your headache. If it does not improve the symptoms please take one additional tablet in 2 hours. Do not take more then 2 tablets in 24hrs. Do not take use more then 2 to 3 times in a week.    Discussed: To prevent or relieve headaches, try the following: Cool Compress. Lie down and place a cool compress on your head.  Avoid headache triggers. If certain foods or odors seem to have triggered your migraines in the past, avoid them. A headache diary might help you identify triggers.  Include physical activity in your daily routine. Try a daily walk or other moderate aerobic exercise.  Manage stress. Find healthy ways to cope with the stressors, such as delegating tasks on your to-do list.  Practice  relaxation techniques. Try deep breathing, yoga, massage and visualization.  Eat regularly. Eating regularly scheduled meals and maintaining a healthy diet might help prevent headaches. Also, drink plenty of fluids.  Follow a regular sleep schedule. Sleep deprivation might contribute to headaches Consider biofeedback. With this mind-body technique, you learn to control certain bodily functions - such as muscle tension, heart rate and blood pressure - to prevent headaches or reduce headache pain.    Proceed to emergency room if you experience new or worsening symptoms or symptoms do not resolve, if you have new neurologic symptoms or if headache is severe, or for any concerning symptom.   Naomie Dean, MD  [facility] [address] [place], [place] [address]  Phone [phone] Fax [phone]  A total of 30 minutes was spent in with this patient face-to-face. Over half this time was spent on counseling patient on the migraine diagnosis and different management options available.

## 2017-10-25 NOTE — Telephone Encounter (Signed)
I'm going to see her at 1pm, please put her on the schedule thanks

## 2017-10-26 ENCOUNTER — Telehealth: Payer: Self-pay | Admitting: *Deleted

## 2017-10-26 ENCOUNTER — Ambulatory Visit
Admission: RE | Admit: 2017-10-26 | Discharge: 2017-10-26 | Disposition: A | Discharge status: Home / Self Care | Payer: Medicaid Other | Source: Ambulatory Visit | Attending: Neurology | Admitting: Neurology

## 2017-10-26 DIAGNOSIS — H547 Unspecified visual loss: Secondary | ICD-10-CM

## 2017-10-26 DIAGNOSIS — G08 Intracranial and intraspinal phlebitis and thrombophlebitis: Secondary | ICD-10-CM

## 2017-10-26 DIAGNOSIS — H93A9 Pulsatile tinnitus, unspecified ear: Secondary | ICD-10-CM

## 2017-10-26 MED ORDER — IOPAMIDOL (ISOVUE-300) INJECTION 61%
75.0000 mL | Freq: Once | INTRAVENOUS | Status: AC | PRN
  Administered 2017-10-26: 75 mL via INTRAVENOUS

## 2017-10-26 NOTE — Telephone Encounter (Signed)
Spoke with patient and informed her the CT venogram of her head showed no evidence for venous sinus thrombosis. Advised her that is a good result. She verbalized understanding, appreciation for call.

## 2017-11-18 ENCOUNTER — Ambulatory Visit: Payer: Self-pay | Admitting: Infectious Disease

## 2017-11-25 ENCOUNTER — Telehealth: Payer: Self-pay | Admitting: *Deleted

## 2017-11-25 NOTE — Telephone Encounter (Signed)
Faxed Rizatriptan 10 mg tablet PA form and supporting office notes, medicaid card copy to [employer]. Received a receipt of confirmation.

## 2017-12-02 NOTE — Telephone Encounter (Signed)
PA for Rizatriptan 10 mg 9 tablets was denied and the letter from [facility] stated that limit was higher. Called pt's pharmacy [facility]), prescription had been transferred to [facility]. Called [facility] and confirmed that the Rizatriptan prescription went through when claim was run.

## 2017-12-21 ENCOUNTER — Ambulatory Visit: Payer: Medicaid Other | Admitting: Neurology

## 2017-12-21 ENCOUNTER — Telehealth: Payer: Self-pay | Admitting: *Deleted

## 2017-12-21 NOTE — Telephone Encounter (Signed)
Pt no showed f/u appt on 12/21/2017 @ 09:30.

## 2017-12-22 ENCOUNTER — Encounter: Payer: Self-pay | Admitting: Neurology

## 2018-01-31 ENCOUNTER — Telehealth: Payer: Self-pay | Admitting: Neurology

## 2018-01-31 NOTE — Telephone Encounter (Signed)
Late Entry:  Patient called on call on July 4th 2019, complains of persistent headache for few days, hx of s Arnold-Chiari malformation decompression surgery in the past,  I have advised her to try home remedy, maxalt, may combine with NSAIDs, zofran as needed.

## 2018-01-31 NOTE — Telephone Encounter (Signed)
Noted, Dr. Lucia GaskinsAhern also aware.

## 2018-04-10 ENCOUNTER — Telehealth: Payer: Self-pay | Admitting: Neurology

## 2018-04-10 NOTE — Telephone Encounter (Signed)
She responded only to the third call- [phone]. Having a migraine und responsive to her usual triptans, and wanting to know which medication should be tried next. Had a migraine cocktail already. [place], and I learnt she was in the waiting area of the ED while  waiting for a migraine treatment.  I explained that her ED physicians [facility] page back [facility] as needed.  She may benefit from some steroid and depakene IV if the local hospital has these on hand.  She was appreciative . CD

## 2018-04-21 ENCOUNTER — Ambulatory Visit: Payer: Medicaid Other | Admitting: Neurology

## 2018-07-15 ENCOUNTER — Other Ambulatory Visit: Payer: Self-pay

## 2018-07-15 MED ORDER — VALACYCLOVIR HCL 1 G PO TABS: 1000.0000 mg | ORAL_TABLET | Freq: Every day | ORAL | 5 refills | Status: DC | PRN

## 2018-09-13 ENCOUNTER — Telehealth: Payer: Self-pay | Admitting: *Deleted

## 2018-09-13 NOTE — Telephone Encounter (Signed)
I called pt 3 times unable to reach pt.

## 2018-09-15 NOTE — Telephone Encounter (Signed)
I faxed medical records over to [facility] medical to [facility] in Medical records 2 times to [phone]

## 2019-09-22 ENCOUNTER — Other Ambulatory Visit: Payer: Self-pay

## 2019-09-26 MED ORDER — VALACYCLOVIR HCL 1 G PO TABS
1000.0000 mg | ORAL_TABLET | Freq: Every day | ORAL | 5 refills | Status: AC | PRN
Start: 2019-09-26 — End: ?

## 2021-12-30 ENCOUNTER — Encounter: Payer: Self-pay | Admitting: *Deleted
